# Patient Record
Sex: Female | Born: 1985 | Race: Black or African American | Hispanic: No | Marital: Single | Smoking: Former smoker
Health system: Southern US, Community
[De-identification: ages and names within clinical notes are randomized; demographics above are authoritative.]

## PROBLEM LIST (undated history)

## (undated) ENCOUNTER — Inpatient Hospital Stay (HOSPITAL_COMMUNITY): Payer: Self-pay

## (undated) DIAGNOSIS — J45909 Unspecified asthma, uncomplicated: Secondary | ICD-10-CM

## (undated) DIAGNOSIS — IMO0001 Reserved for inherently not codable concepts without codable children: Secondary | ICD-10-CM

## (undated) DIAGNOSIS — F319 Bipolar disorder, unspecified: Secondary | ICD-10-CM

## (undated) DIAGNOSIS — B009 Herpesviral infection, unspecified: Secondary | ICD-10-CM

## (undated) DIAGNOSIS — O1490 Unspecified pre-eclampsia, unspecified trimester: Secondary | ICD-10-CM

## (undated) DIAGNOSIS — F329 Major depressive disorder, single episode, unspecified: Secondary | ICD-10-CM

## (undated) DIAGNOSIS — D649 Anemia, unspecified: Secondary | ICD-10-CM

## (undated) DIAGNOSIS — A549 Gonococcal infection, unspecified: Secondary | ICD-10-CM

## (undated) DIAGNOSIS — F32A Depression, unspecified: Secondary | ICD-10-CM

## (undated) DIAGNOSIS — R51 Headache: Secondary | ICD-10-CM

## (undated) DIAGNOSIS — B999 Unspecified infectious disease: Secondary | ICD-10-CM

## (undated) DIAGNOSIS — F419 Anxiety disorder, unspecified: Secondary | ICD-10-CM

## (undated) DIAGNOSIS — O139 Gestational [pregnancy-induced] hypertension without significant proteinuria, unspecified trimester: Secondary | ICD-10-CM

## (undated) DIAGNOSIS — A749 Chlamydial infection, unspecified: Secondary | ICD-10-CM

## (undated) DIAGNOSIS — R87629 Unspecified abnormal cytological findings in specimens from vagina: Secondary | ICD-10-CM

## (undated) HISTORY — DX: Reserved for inherently not codable concepts without codable children: IMO0001

## (undated) HISTORY — DX: Herpesviral infection, unspecified: B00.9

## (undated) HISTORY — PX: COLPOSCOPY: SHX161

## (undated) HISTORY — PX: WISDOM TOOTH EXTRACTION: SHX21

---
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
## 2007-02-24 ENCOUNTER — Emergency Department (HOSPITAL_COMMUNITY): Admission: EM | Admit: 2007-02-24 | Discharge: 2007-02-24 | Payer: Self-pay | Admitting: Emergency Medicine

## 2007-05-17 ENCOUNTER — Emergency Department (HOSPITAL_COMMUNITY): Admission: EM | Admit: 2007-05-17 | Discharge: 2007-05-17 | Payer: Self-pay | Admitting: Emergency Medicine

## 2007-08-02 ENCOUNTER — Inpatient Hospital Stay (HOSPITAL_COMMUNITY): Admission: EM | Admit: 2007-08-02 | Discharge: 2007-08-04 | Payer: Self-pay | Admitting: Emergency Medicine

## 2007-08-04 ENCOUNTER — Ambulatory Visit: Payer: Self-pay | Admitting: *Deleted

## 2007-08-04 ENCOUNTER — Inpatient Hospital Stay (HOSPITAL_COMMUNITY): Admission: AD | Admit: 2007-08-04 | Discharge: 2007-08-07 | Payer: Self-pay | Admitting: *Deleted

## 2007-09-13 ENCOUNTER — Emergency Department (HOSPITAL_COMMUNITY): Admission: EM | Admit: 2007-09-13 | Discharge: 2007-09-14 | Payer: Self-pay | Admitting: Family Medicine

## 2007-11-21 ENCOUNTER — Emergency Department (HOSPITAL_COMMUNITY): Admission: EM | Admit: 2007-11-21 | Discharge: 2007-11-21 | Payer: Self-pay | Admitting: Emergency Medicine

## 2008-07-05 ENCOUNTER — Inpatient Hospital Stay (HOSPITAL_COMMUNITY): Admission: AD | Admit: 2008-07-05 | Discharge: 2008-07-05 | Payer: Self-pay | Admitting: Obstetrics & Gynecology

## 2008-07-17 ENCOUNTER — Inpatient Hospital Stay (HOSPITAL_COMMUNITY): Admission: AD | Admit: 2008-07-17 | Discharge: 2008-07-17 | Payer: Self-pay | Admitting: Obstetrics

## 2008-08-02 ENCOUNTER — Inpatient Hospital Stay (HOSPITAL_COMMUNITY): Admission: AD | Admit: 2008-08-02 | Discharge: 2008-08-02 | Payer: Self-pay | Admitting: Obstetrics

## 2008-08-13 ENCOUNTER — Ambulatory Visit (HOSPITAL_COMMUNITY): Admission: RE | Admit: 2008-08-13 | Discharge: 2008-08-13 | Payer: Self-pay | Admitting: Obstetrics & Gynecology

## 2008-09-10 ENCOUNTER — Ambulatory Visit (HOSPITAL_COMMUNITY): Admission: RE | Admit: 2008-09-10 | Discharge: 2008-09-10 | Payer: Self-pay | Admitting: Obstetrics & Gynecology

## 2008-09-16 ENCOUNTER — Ambulatory Visit (HOSPITAL_COMMUNITY): Admission: RE | Admit: 2008-09-16 | Discharge: 2008-09-16 | Payer: Self-pay | Admitting: Obstetrics & Gynecology

## 2008-10-15 ENCOUNTER — Ambulatory Visit (HOSPITAL_COMMUNITY): Admission: RE | Admit: 2008-10-15 | Discharge: 2008-10-15 | Payer: Self-pay | Admitting: Obstetrics & Gynecology

## 2008-11-28 ENCOUNTER — Emergency Department (HOSPITAL_COMMUNITY): Admission: EM | Admit: 2008-11-28 | Discharge: 2008-11-28 | Payer: Self-pay | Admitting: Emergency Medicine

## 2008-12-18 ENCOUNTER — Inpatient Hospital Stay (HOSPITAL_COMMUNITY): Admission: AD | Admit: 2008-12-18 | Discharge: 2008-12-18 | Payer: Self-pay | Admitting: Family Medicine

## 2009-02-10 ENCOUNTER — Ambulatory Visit: Payer: Self-pay | Admitting: Obstetrics and Gynecology

## 2009-02-10 ENCOUNTER — Inpatient Hospital Stay (HOSPITAL_COMMUNITY): Admission: AD | Admit: 2009-02-10 | Discharge: 2009-02-12 | Payer: Self-pay | Admitting: Obstetrics & Gynecology

## 2009-05-04 ENCOUNTER — Emergency Department (HOSPITAL_COMMUNITY): Admission: EM | Admit: 2009-05-04 | Discharge: 2009-05-04 | Payer: Self-pay | Admitting: Emergency Medicine

## 2009-05-29 ENCOUNTER — Emergency Department (HOSPITAL_COMMUNITY): Admission: EM | Admit: 2009-05-29 | Discharge: 2009-05-29 | Payer: Self-pay | Admitting: Emergency Medicine

## 2009-08-21 ENCOUNTER — Inpatient Hospital Stay (HOSPITAL_COMMUNITY): Admission: AD | Admit: 2009-08-21 | Discharge: 2009-08-21 | Payer: Self-pay | Admitting: Obstetrics and Gynecology

## 2009-08-24 ENCOUNTER — Inpatient Hospital Stay (HOSPITAL_COMMUNITY): Admission: AD | Admit: 2009-08-24 | Discharge: 2009-08-24 | Payer: Self-pay | Admitting: Obstetrics and Gynecology

## 2009-08-26 ENCOUNTER — Emergency Department (HOSPITAL_COMMUNITY): Admission: EM | Admit: 2009-08-26 | Discharge: 2009-08-26 | Payer: Self-pay | Admitting: Emergency Medicine

## 2009-08-31 ENCOUNTER — Inpatient Hospital Stay (HOSPITAL_COMMUNITY): Admission: AD | Admit: 2009-08-31 | Discharge: 2009-08-31 | Payer: Self-pay | Admitting: Obstetrics & Gynecology

## 2009-11-07 ENCOUNTER — Emergency Department (HOSPITAL_COMMUNITY): Admission: EM | Admit: 2009-11-07 | Discharge: 2009-11-07 | Payer: Self-pay | Admitting: Emergency Medicine

## 2009-11-16 ENCOUNTER — Inpatient Hospital Stay (HOSPITAL_COMMUNITY): Admission: AD | Admit: 2009-11-16 | Discharge: 2009-11-16 | Payer: Self-pay | Admitting: Obstetrics & Gynecology

## 2009-11-28 ENCOUNTER — Ambulatory Visit (HOSPITAL_COMMUNITY): Admission: RE | Admit: 2009-11-28 | Discharge: 2009-11-28 | Payer: Self-pay | Admitting: Obstetrics & Gynecology

## 2010-02-08 ENCOUNTER — Ambulatory Visit: Payer: Self-pay | Admitting: Obstetrics and Gynecology

## 2010-02-08 ENCOUNTER — Inpatient Hospital Stay (HOSPITAL_COMMUNITY): Admission: AD | Admit: 2010-02-08 | Discharge: 2010-02-08 | Payer: Self-pay | Admitting: Obstetrics

## 2010-02-27 ENCOUNTER — Inpatient Hospital Stay (HOSPITAL_COMMUNITY): Admission: AD | Admit: 2010-02-27 | Discharge: 2010-02-27 | Payer: Self-pay | Admitting: Obstetrics

## 2010-03-16 ENCOUNTER — Inpatient Hospital Stay (HOSPITAL_COMMUNITY): Admission: AD | Admit: 2010-03-16 | Discharge: 2010-03-16 | Payer: Self-pay | Admitting: Obstetrics & Gynecology

## 2010-03-25 ENCOUNTER — Inpatient Hospital Stay (HOSPITAL_COMMUNITY): Admission: AD | Admit: 2010-03-25 | Discharge: 2010-03-25 | Payer: Self-pay | Admitting: Obstetrics

## 2010-03-31 ENCOUNTER — Inpatient Hospital Stay (HOSPITAL_COMMUNITY): Admission: AD | Admit: 2010-03-31 | Discharge: 2010-04-02 | Payer: Self-pay | Admitting: Obstetrics

## 2010-05-13 ENCOUNTER — Emergency Department (HOSPITAL_COMMUNITY): Admission: EM | Admit: 2010-05-13 | Discharge: 2010-05-13 | Payer: Self-pay | Admitting: Family Medicine

## 2011-02-09 LAB — CBC
HCT: 29.3 % — ABNORMAL LOW (ref 36.0–46.0)
HCT: 35.3 % — ABNORMAL LOW (ref 36.0–46.0)
Hemoglobin: 10.3 g/dL — ABNORMAL LOW (ref 12.0–15.0)
Hemoglobin: 12.3 g/dL (ref 12.0–15.0)
MCHC: 34.7 g/dL (ref 30.0–36.0)
MCV: 94.9 fL (ref 78.0–100.0)
RBC: 3.72 MIL/uL — ABNORMAL LOW (ref 3.87–5.11)
RDW: 13.9 % (ref 11.5–15.5)
WBC: 9.1 10*3/uL (ref 4.0–10.5)

## 2011-02-09 LAB — COMPREHENSIVE METABOLIC PANEL
ALT: 12 U/L (ref 0–35)
Alkaline Phosphatase: 78 U/L (ref 39–117)
BUN: 6 mg/dL (ref 6–23)
CO2: 24 mEq/L (ref 19–32)
Calcium: 8.1 mg/dL — ABNORMAL LOW (ref 8.4–10.5)
Chloride: 108 mEq/L (ref 96–112)
Creatinine, Ser: 0.81 mg/dL (ref 0.4–1.2)
GFR calc Af Amer: >60 mL/min (ref >60)
GFR calc non Af Amer: >60 mL/min (ref >60)
Glucose, Bld: 73 mg/dL (ref 70–99)

## 2011-02-09 LAB — GC/CHLAMYDIA PROBE AMP, GENITAL: Chlamydia, DNA Probe: NEGATIVE

## 2011-02-09 LAB — WET PREP, GENITAL: Yeast Wet Prep HPF POC: NONE SEEN

## 2011-02-09 LAB — LACTATE DEHYDROGENASE: LDH: 276 U/L — ABNORMAL HIGH (ref 94–250)

## 2011-02-10 LAB — STREP B DNA PROBE: Strep Group B Ag: NEGATIVE

## 2011-02-10 LAB — WET PREP, GENITAL
Clue Cells Wet Prep HPF POC: NONE SEEN
Trich, Wet Prep: NONE SEEN
Yeast Wet Prep HPF POC: NONE SEEN

## 2011-02-14 LAB — URINALYSIS, ROUTINE W REFLEX MICROSCOPIC
Glucose, UA: NEGATIVE mg/dL
Ketones, ur: NEGATIVE mg/dL
Nitrite: NEGATIVE
pH: 7.5 (ref 5.0–8.0)

## 2011-02-14 LAB — URINE MICROSCOPIC-ADD ON

## 2011-02-22 LAB — URINALYSIS, ROUTINE W REFLEX MICROSCOPIC
Glucose, UA: NEGATIVE mg/dL
Hgb urine dipstick: NEGATIVE
Ketones, ur: 15 mg/dL — AB
Nitrite: NEGATIVE
Protein, ur: 30 mg/dL — AB
Specific Gravity, Urine: 1.029 (ref 1.005–1.030)
Urobilinogen, UA: 1 mg/dL (ref 0.0–1.0)
Urobilinogen, UA: 1 mg/dL (ref 0.0–1.0)

## 2011-02-22 LAB — URINE MICROSCOPIC-ADD ON

## 2011-02-22 LAB — POCT I-STAT, CHEM 8
BUN: 11 mg/dL (ref 6–23)
Chloride: 107 mEq/L (ref 96–112)
Potassium: 3.6 mEq/L (ref 3.5–5.1)
Sodium: 137 mEq/L (ref 135–145)
TCO2: 23 mmol/L (ref 0–100)

## 2011-02-22 LAB — WET PREP, GENITAL

## 2011-02-22 LAB — URINE CULTURE: Colony Count: 25000

## 2011-02-25 LAB — URINE MICROSCOPIC-ADD ON

## 2011-02-25 LAB — URINALYSIS, ROUTINE W REFLEX MICROSCOPIC
Glucose, UA: NEGATIVE mg/dL
Glucose, UA: NEGATIVE mg/dL
Ketones, ur: NEGATIVE mg/dL
Nitrite: NEGATIVE
Protein, ur: 30 mg/dL — AB
Protein, ur: NEGATIVE mg/dL
Specific Gravity, Urine: 1.015 (ref 1.005–1.030)
Specific Gravity, Urine: 1.035 — ABNORMAL HIGH (ref 1.005–1.030)
Urobilinogen, UA: 1 mg/dL (ref 0.0–1.0)
pH: 6 (ref 5.0–8.0)

## 2011-02-25 LAB — WET PREP, GENITAL: Clue Cells Wet Prep HPF POC: NONE SEEN

## 2011-02-25 LAB — CBC
HCT: 35.3 % — ABNORMAL LOW (ref 36.0–46.0)
MCHC: 34 g/dL (ref 30.0–36.0)
MCV: 93.4 fL (ref 78.0–100.0)
Platelets: 159 10*3/uL (ref 150–400)
RDW: 14.6 % (ref 11.5–15.5)

## 2011-02-25 LAB — COMPREHENSIVE METABOLIC PANEL
ALT: 8 U/L (ref 0–35)
AST: 15 U/L (ref 0–37)
Albumin: 3.5 g/dL (ref 3.5–5.2)
CO2: 25 mEq/L (ref 19–32)
Calcium: 8.9 mg/dL (ref 8.4–10.5)
GFR calc Af Amer: >60 mL/min (ref >60)
Sodium: 134 mEq/L — ABNORMAL LOW (ref 135–145)
Total Protein: 6.1 g/dL (ref 6.0–8.3)

## 2011-02-25 LAB — URINE CULTURE: Colony Count: 30000

## 2011-03-01 LAB — URINALYSIS, ROUTINE W REFLEX MICROSCOPIC
Bilirubin Urine: NEGATIVE
Nitrite: NEGATIVE
Specific Gravity, Urine: 1.015 (ref 1.005–1.030)
pH: 7 (ref 5.0–8.0)

## 2011-03-01 LAB — URINE CULTURE

## 2011-03-01 LAB — URINE MICROSCOPIC-ADD ON

## 2011-03-04 LAB — CBC
HCT: 34.3 % — ABNORMAL LOW (ref 36.0–46.0)
Hemoglobin: 11.4 g/dL — ABNORMAL LOW (ref 12.0–15.0)
MCV: 92.5 fL (ref 78.0–100.0)
Platelets: 156 10*3/uL (ref 150–400)
WBC: 9.2 10*3/uL (ref 4.0–10.5)

## 2011-03-08 LAB — CBC
HCT: 30.7 % — ABNORMAL LOW (ref 36.0–46.0)
Hemoglobin: 10.4 g/dL — ABNORMAL LOW (ref 12.0–15.0)
MCHC: 33.8 g/dL (ref 30.0–36.0)
MCV: 93.2 fL (ref 78.0–100.0)
RBC: 3.29 MIL/uL — ABNORMAL LOW (ref 3.87–5.11)
RDW: 13.2 % (ref 11.5–15.5)

## 2011-03-08 LAB — BASIC METABOLIC PANEL
CO2: 21 mEq/L (ref 19–32)
Chloride: 107 mEq/L (ref 96–112)
Glucose, Bld: 76 mg/dL (ref 70–99)
Potassium: 3.2 mEq/L — ABNORMAL LOW (ref 3.5–5.1)
Sodium: 132 mEq/L — ABNORMAL LOW (ref 135–145)

## 2011-03-08 LAB — URINALYSIS, ROUTINE W REFLEX MICROSCOPIC
Bilirubin Urine: NEGATIVE
Glucose, UA: NEGATIVE mg/dL
Hgb urine dipstick: NEGATIVE
Specific Gravity, Urine: 1.015 (ref 1.005–1.030)
Urobilinogen, UA: 0.2 mg/dL (ref 0.0–1.0)

## 2011-03-08 LAB — DIFFERENTIAL
Basophils Absolute: 0 10*3/uL (ref 0.0–0.1)
Basophils Relative: 0 % (ref 0–1)
Eosinophils Relative: 0 % (ref 0–5)
Monocytes Absolute: 0.7 10*3/uL (ref 0.1–1.0)
Monocytes Relative: 12 % (ref 3–12)

## 2011-04-06 NOTE — Consult Note (Signed)
NAMEONEY, TATLOCK NO.:  [id number]   MEDICAL RECORD NO.:  [id number]          PATIENT TYPE:  OBV   LOCATION:  4706                         FACILITY:  [facility]   PHYSICIAN:  Antonietta Breach, M.D.  DATE OF BIRTH:  06/10/1986   DATE OF CONSULTATION:  08/02/2007  DATE OF DISCHARGE:                                 CONSULTATION   REASON FOR CONSULTATION:  Overdose.   REQUESTING PHYSICIAN:  [facility].   HISTORY OF PRESENT ILLNESS:  Mrs. Terrace Chiem is a 25 year old female  admitted to the [facility] on August 01, 2007, due to overdose.   The patient states that she had a number of stresses.  She has not been  able to see her 56-year-old daughter as much as she would like to.  Her 30-  year-old daughter was under the custody of her sister.  The patient also  has had financial stress.  She has had some disagreements with her  boyfriend.  She lives with her boyfriend in an apartment.   The patient states that she has had over 8 weeks of depressed mood,  irritability, low concentration, anhedonia, difficulty sleeping.  She  has thought of other methods of harming herself, including cutting.  After a fight with her boyfriend, she took an overdose of iron   PAST PSYCHIATRIC HISTORY:  On review of the past medical record,  schizophrenia is listed; however, the patient does not describe having  auditory hallucinations or hallucinations of any kind in her past.  She  did have a pretend childhood friend, up until the age of 46.   The patient describes periods lasting several days, where she has needed  very little sleep, has had high energy, as well as engaging in  __________ substance use with marijuana alcohol.  She also has had a  major depressive episodes.  She denies any history of self harm.   She was on psychotropic medication until 1 year ago when she stopped,  and she stated that they were making her too sedated.   The patient has been admitted to  [facility] before.   The patient's past psychotropics include Seroquel and Trileptal.   FAMILY PSYCHIATRIC HISTORY:  None known.   SOCIAL HISTORY:  The patient has one child, an 29-year-old daughter.  This 14-year-old daughter was raised by the patient's sister.  The 8-year-  old daughter visits the patient off and on.  The patient continues to  smoke marijuana on a regular basis.  She states that it helps her feel  better.  Occupation:  Unemployed.  She has currently broken up with her  boyfriend.  She was living with him in an apartment.   PAST MEDICAL HISTORY:  Asthma, status post iron overdose.   MEDICATIONS:  MAR is reviewed.  The patient is on Ativan 1 mg q.6 hours  p.r.n.   ALLERGIES:  SHE HAS NO KNOWN DRUG ALLERGIES.   LABORATORY DATA:  Urine drug screen positive for benzodiazepines,  positive for tetrahydrocannabinol, pregnancy test negative, SGOT 21,  SGPT 8, albumin 3.6, Tylenol negative, BUN 15, creatinine  0.85, WBC 6.1,  hemoglobin 10.8, platelet count 194.   REVIEW OF SYSTEMS:  CONSTITUTIONAL:  Afebrile.  HEAD:  No trauma.  EYES:  No visual changes.  EARS:  No hearing impairment.  NOSE:  No rhinorrhea.  MOUTH/THROAT:  No sore throat.  NEUROLOGIC:  No focal motor or sensory  deficit.  PSYCHIATRIC:  As above.  CARDIOVASCULAR:  No chest pain,  palpitations.  RESPIRATORY:  No coughing or wheezing.  GASTROINTESTINAL:  The patient still has some abdominal discomfort.  She is not vomiting.  GENITOURINARY:  No dysuria.  SKIN:  Unremarkable.  ENDOCRINE/METABOLIC:  No heat or cold intolerance.  MUSCULOSKELETAL:  No deformities.  HEMATOLOGIC/LYMPHATIC:  Anemia.   EXAMINATION:  VITAL SIGNS:  Temperature 97.9, pulse 59, respiratory rate  20, blood pressure 99/60, weight is 66 kg, height 64 inches.  GENERAL APPEARANCE:  Ms. Kruczek is a young female appearing her  chronologic age, lying in a supine position in her hospital bed.  She  has no abnormal involuntary  movements.  She is well-groomed.   OTHER MENTAL STATUS EXAM:  Ms. Panning is alert.  Her attention span is  within normal limits.  She has good eye contact.   She is oriented to all spheres.  Memory is intact to immediate, recent,  and remote.  Fund of knowledge and intelligence are within normal  limits.  Her affect is very constricted, and she begins to cry with  tears spontaneously.  Her mood is depressed.  Her speech involves normal  rate and prosody without dysarthria.  Thought process logical, coherent,  goal-directed, no looseness of associations. Language, expression and  comprehension are intact.  Abstraction ability is intact.  Thought  content:  Please see the history of present illness.  The patient does  acknowledge an intentional overdose.  She also has thoughts of  hopelessness and helplessness.  She has no hallucinations or delusions.   Her insight is partial.   Her judgment is impaired, for the ability for self-care outside the  hospital.   ASSESSMENT:  Axis I:  1. 293.83, mood disorder not otherwise specified, depressed      (functional and general medical factors).  2. Rule out 296.80, bipolar disorder not otherwise specified,      depressed versus 296.33.  Major depressive disorder, recurrent,      severe.  3. Polysubstance dependence with recent tetrahydrocannabinol, positive      on drug screen and an acknowledgment of regular marijuana use.  Axis II:  Deferred.  Axis III:  See past medical history above.  Axis IV:  Primary support group.  Axis V:  30.   Ms. Herzberg is still at risk to harm herself.   The undersigned provided ego support and education.   RECOMMENDATIONS:  1. Would continue the sitter for suicide precautions.  2. When the patient is medically cleared, would admit her to an      inpatient psychiatric unit for a dual diagnosis track.   RECOMMENDATIONS:  Will defer psychotropic medications at this time.      Antonietta Breach, M.D.   Electronically Signed     JW/MEDQ  D:  08/02/2007  T:  08/03/2007  Job:  [id number]

## 2011-04-06 NOTE — Discharge Summary (Signed)
Tammy Cole, Tammy Cole               ACCOUNT NO.:  [id number]   MEDICAL RECORD NO.:  [id number]          PATIENT TYPE:  OBV   LOCATION:  4706                         FACILITY:  [facility]   PHYSICIAN:  Lonia Blood, M.D.      DATE OF BIRTH:  1986-07-23   DATE OF ADMISSION:  08/01/2007  DATE OF DISCHARGE:  08/03/2007                               DISCHARGE SUMMARY   PRIMARY CARE PHYSICIAN:  The patient was unassigned.   DISCHARGE DIAGNOSES:  1. Drug overdose with iron.  2. Suicide attempt.  3. Polysubstance abuse.   DISCHARGE MEDICATIONS:  1. Albuterol inhaler as needed.  2. Ativan 1 mg q.8 hours p.r.n. for agitation.   DISPOSITION:  The patient will be transferred to inpatient psych for  Psychiatry recommendation.   PROCEDURE PERFORMED THIS ADMISSION:  Abdominal x-ray on 08/01/2007 but  no essentially no specific bowel gas pattern.   CONSULTATIONS:  Antonietta Breach, MD: Psychiatry.   BRIEF HISTORY AND PHYSICAL:  Please refer to dictated history and  physical by me on 08/01/2007.  In short, this is a 25 year old female  who was admitted secondary to taking 20 pills of iron in form of  overdose.  The patient was apparently depressed, worried and was  aggravated by a quarrel with her boyfriend.  She could not hold it  together; hence she took 20 pills of iron.  Later she denied further  suicidal ideation.  She was, however, admitted for suicide attempt and  drug overdose.   HOSPITAL COURSE:  1. Iron overdose.  The patient's total iron level after the incident      was more than 400, at about 443.  It was followed serially, and her      last iron level was 21 yesterday.  This shows that the iron level      has cleared her system or at least her blood stream at this point.      She is subsequently at least out of immediate danger for now.  2. Severe depression.  The patient was seen by Psychiatry.  Our      recommendation is for Inpatient Psych, so will proceed with that.  3. Suicide  attempt again.  The patient has had a sitter and on suicide      precaution all through hospitalization.  In light of this, she will      be treated in an Inpatient Psych facility per Dr. Providence Crosby      recommendation.  Other than she has been doing alright and no major      issues.      Lonia Blood, M.D.  Electronically Signed     LG/MEDQ  D:  08/03/2007  T:  08/03/2007  Job:  [id number]

## 2011-04-06 NOTE — H&P (Signed)
NAMESENIE, Tammy Cole               ACCOUNT NO.:  [id number]   MEDICAL RECORD NO.:  [id number]          PATIENT TYPE:  OBV   LOCATION:  4706                         FACILITY:  [facility]   PHYSICIAN:  Lonia Blood, M.D.      DATE OF BIRTH:  Jan 26, 1986   DATE OF ADMISSION:  08/01/2007  DATE OF DISCHARGE:                              HISTORY & PHYSICAL   PRIMARY CARE Zeta Bucy:  The patient is unassigned.   PRESENTING COMPLAINT:  Iron overdose.   HISTORY OF PRESENT ILLNESS:  The patient is a 25 year old female with  history of depression, bipolar disorder, question of schizophrenia,who  has been off her medications for more than a year.  The patient has been  undergoing a lot of stress lately, breaking up with her boyfriend among  other things.  She was emotionally upset today when she had an episode  of quarreling with her boyfriend.  She took #20 tablets of iron 325 mg.  She subsequently developed severe abdominal pain and was brought to the  emergency room.  She is currently stable, and no longer suicidal.  The  patient said she regrets her actions, and she was only angry.  She  apparently started going back to some therapy lately.   PAST MEDICAL HISTORY:  Significant for bipolar disorder, schizophrenia,  and asthma.   ALLERGIES:  No known drug allergies.   MEDICATIONS:  The patient quit taking her medications except the iron  pills.  She is supposed to be on Advair Diskus, iron, Seroquel,  Singulair, and Trileptal.   SOCIAL HISTORY:  The patient is single.  She has an 42-year-old child.  She denies smoking or alcohol use, or IV drug use.   FAMILY HISTORY:  Denied family history of psychiatric illness.   REVIEW OF SYSTEMS:  Mainly abdominal pain and nausea, which has since  resolved, per patient.   PHYSICAL EXAMINATION:  VITAL SIGNS:  Temperature 97.0, blood pressure  98/56, pulse 60, respiratory rate 18.  Saturation is 100% on room air.  GENERAL:  Generally, she is awake, alert,  oriented, and in no acute  distress.  HEENT:  PERRL, EOMI.  NECK:  Supple, no JVD.  No lymphadenopathy.  RESPIRATORY:  Good air entry bilaterally.  No wheezes, no rales.  CARDIOVASCULAR:  She has S1 and S2.  No murmurs.  ABDOMEN:  Soft, nontender, with positive bowel sounds.  EXTREMITIES:  No edema, cyanosis or clubbing.  PSYCHIATRIC:  The patient has flat affect.  She denies being suicidal at  this point.   LABORATORY DATA:  Showed a white count 6.1, hemoglobin 10.8, and  platelet count 194.  Pregnancy test was negative.  Other labs are  currently pending.  EKG showed sinus rhythm with rate of 57, and some  junctional AV dissociation and junctional rhythm.  No old EKG to  compare.   ASSESSMENT:  This is a 25 year old female with drug overdose mainly  involving iron.  The patient denied active suicidal ideation, but she  did have suicidal attempt when she took all the #20 pills of iron.   PLAN:  1. Drug  overdose:  Will admit the patient to monitored bed.  Will put      her on suicide precautions.  Will get psychiatric consultation to      see the patient.  Once she is cleared from psychiatry, we can      discharge the patient.  In the meantime, however, poison control      has been called.  Recommendations are for observant therapy, check      iron levels in 4 hours, and hydration.  I will also put her on some      proton-pump inhibitors to hopefully help for protection from      gastritis, and await psychiatric consultation.  2. Depression, bipolar disorder:  Again, the patient has been off her      medications including Seroquel and Trileptal.  I will get      psychiatric consultation, and probably get the patient back on her      medicines as soon as possible.  3. History of asthma.  I will put her on p.r.n. nebulizers while in      the hospital.  Otherwise, other treatment will depend on how      patient behaves overnight.  We will keep her on observation.      Lonia Blood, M.D.  Electronically Signed     LG/MEDQ  D:  08/01/2007  T:  08/01/2007  Job:  [id number]

## 2011-04-09 NOTE — Discharge Summary (Signed)
Tammy Cole, NICKLE NO.:  [id number]   MEDICAL RECORD NO.:  [id number]          PATIENT TYPE:  IPS   LOCATION:  0303                          [facility]ACILITY:  [facility]   PHYSICIAN:  Jasmine Pang, M.D. DATE OF BIRTH:  Apr 18, 1986   DATE OF ADMISSION:  08/04/2007  DATE OF DISCHARGE:  08/07/2007                               DISCHARGE SUMMARY   IDENTIFICATION:  A 25 year old single African American female who was  admitted on August 04, 2007.   HISTORY OF PRESENT ILLNESS:  The patient was sent for [facility] with the diagnosis of depression.  Apparently, she had gotten  into a fight with her boyfriend on August 02, 2007.  She then  overdosed on iron pills, took 20 tablets.  Other stressors as stated are  housing, job and wants her 70-year-old daughter back who is now under the  custody of her mother.  She reports a recent history of irritability,  low concentration, anhedonia, and difficulty sleeping.  Her iron level  was at 400 when she went into the [facility] on August 01, 2007.  It is now down to 21.  Poison Control was called.  She denies any  alcohol or other substance use.  She has had a history of being treated  at [facility] inpatient.  She has no current outpatient  treatment.  She is on Advair Diskus, iron pills, and Singulair.  She has  been on Seroquel and Trileptal, but stopped these psych meds 1 year ago.  She has a history of asthma.  She has no known drug allergies.   PHYSICAL EXAMINATION:  There were no acute physical abnormalities noted.  Complete physical exam is noted in the discharge summary from her  inpatient stay on the medical unit.   ADMISSION LABORATORIES:  Not repeated.  They were done on the medical  unit and reviewed by her internist.   HOSPITAL COURSE:  Upon admission, the patient was placed on Ambien 10 mg  p.o. p.r.n. at h.s. for inability to sleep.  She was also placed on  Celexa 20 mg daily and  Vistaril 25 mg q. 6h. p.r.n. anxiety.  She was  started on Anaprox 500 mg p.o. b.i.d. p.r.n. pain due to phlebitis in  her left arm secondary to potassium infusion.  She was also given warm  compresses to her left arm q.i.d. x3 days.  The patient tolerated her  medications well with no significant side effects.  The patient was  friendly and cooperative.  She was somewhat sleepy, but oriented x4.  She was nonpsychotic.  She did appear to be depressed.  She had had some  agitation according to her nurse, but this was resolving.  This had been  after talking with her boyfriend on the phone the day before.  She  states she has a history of bipolar disorder.  She has previously been  on Seroquel 600 mg daily and Trileptal ? dose.  She has been quite  stable.  She reports recent irritability and losing her temper with her  boyfriend.  She has had no suicidal thoughts in 24 hours. On August 07, 2007, mental status had improved from admission status.  Mood was  euthymic.  Affect wide range.  There was no suicidal or homicidal  ideation.  No thoughts of self-injurious behavior.  No paranoia or  delusions.  Thoughts were logical and goal directed.  Thought content no  predominant theme.  Cognitive was grossly back to baseline.  It was felt  the patient was safe to be discharged today.  She was able to voice  understanding of her discharge instructions and followup plan.   DISCHARGE DIAGNOSES:  AXIS I:  Depressive disorder, not otherwise  specified.  AXIS II:  None.  AXIS III:  None.  AXIS IV:  Moderate (relationship stress, burden of psychiatric illness).  AXIS V:  Global assessment of functioning upon discharge was 50.  Global  assessment of functioning upon admission was 38.  Global assessment of  functioning highest past year was 69.   DISCHARGE PLANS:  There were no specific activity level or dietary  restrictions.   POSTHOSPITAL CARE PLANS:  The patient will be seen at the [facility]  on August 08, 2007, at 9:00 a.m..   DISCHARGE MEDICATIONS:  Celexa 20 mg daily and Ambien 10 mg at bedtime  p.r.n. insomnia.      Jasmine Pang, M.D.  Electronically Signed     BHS/MEDQ  D:  08/26/2007  T:  08/26/2007  Job:  [id number]

## 2011-08-20 LAB — URINE MICROSCOPIC-ADD ON

## 2011-08-20 LAB — WET PREP, GENITAL: Yeast Wet Prep HPF POC: NONE SEEN

## 2011-08-20 LAB — GC/CHLAMYDIA PROBE AMP, GENITAL
Chlamydia, DNA Probe: NEGATIVE
GC Probe Amp, Genital: NEGATIVE

## 2011-08-20 LAB — URINALYSIS, ROUTINE W REFLEX MICROSCOPIC
Bilirubin Urine: NEGATIVE
Glucose, UA: NEGATIVE
Hgb urine dipstick: NEGATIVE
Protein, ur: NEGATIVE
Urobilinogen, UA: 0.2

## 2011-08-25 LAB — URINALYSIS, ROUTINE W REFLEX MICROSCOPIC
Hgb urine dipstick: NEGATIVE
Ketones, ur: >80 — AB
Nitrite: NEGATIVE
Specific Gravity, Urine: >1.03 — ABNORMAL HIGH
pH: 6

## 2011-09-03 LAB — COMPREHENSIVE METABOLIC PANEL
AST: 21
BUN: 15
CO2: 23
Calcium: 8.4
Chloride: 110
Creatinine, Ser: 0.85
GFR calc non Af Amer: >60
Glucose, Bld: 167 — ABNORMAL HIGH
Total Bilirubin: 1

## 2011-09-03 LAB — DIFFERENTIAL
Basophils Absolute: 0
Eosinophils Relative: 0
Lymphocytes Relative: 19
Lymphs Abs: 1.1
Neutro Abs: 4.6
Neutrophils Relative %: 75

## 2011-09-03 LAB — URINE MICROSCOPIC-ADD ON

## 2011-09-03 LAB — CBC
HCT: 32.5 — ABNORMAL LOW
MCHC: 33.2
MCV: 88
RBC: 3.7 — ABNORMAL LOW
WBC: 6.1

## 2011-09-03 LAB — RAPID URINE DRUG SCREEN, HOSP PERFORMED
Barbiturates: NOT DETECTED
Benzodiazepines: POSITIVE — AB
Cocaine: NOT DETECTED
Opiates: NOT DETECTED

## 2011-09-03 LAB — IRON
Iron: 21 — ABNORMAL LOW
Iron: 443 — ABNORMAL HIGH
Iron: 446 — ABNORMAL HIGH

## 2011-09-03 LAB — URINALYSIS, ROUTINE W REFLEX MICROSCOPIC
Glucose, UA: NEGATIVE
Hgb urine dipstick: NEGATIVE
Ketones, ur: 15 — AB
Protein, ur: NEGATIVE
Urobilinogen, UA: 0.2

## 2011-09-03 LAB — POCT PREGNANCY, URINE: Preg Test, Ur: NEGATIVE

## 2011-09-08 LAB — URINE MICROSCOPIC-ADD ON

## 2011-09-08 LAB — URINALYSIS, ROUTINE W REFLEX MICROSCOPIC
Glucose, UA: NEGATIVE
Hgb urine dipstick: NEGATIVE
Protein, ur: NEGATIVE
Specific Gravity, Urine: >1.03 — ABNORMAL HIGH
pH: 6

## 2011-09-08 LAB — CBC
Hemoglobin: 11.2 — ABNORMAL LOW
MCHC: 33
Platelets: 203
RDW: 16.2 — ABNORMAL HIGH

## 2011-09-08 LAB — RPR: RPR Ser Ql: NONREACTIVE

## 2011-09-08 LAB — GC/CHLAMYDIA PROBE AMP, GENITAL: Chlamydia, DNA Probe: NEGATIVE

## 2012-11-22 NOTE — L&D Delivery Note (Signed)
Delivery Note At 9:31 AM a viable female was delivered via  (Presentation: ROA).  APGAR: 9.   Placenta status: delivered with assistance, intact.  Cord:  with the following complications: none, 3 vessels.   Anesthesia: Epidural, local Episiotomy:  None Lacerations: Labial Suture Repair: 2.0 vicryl rapide Est. Blood Loss (mL): 250 ml  Mom to postpartum.  Baby to nursery-stable.  JACKSON-MOORE,Pragya Lofaso A 08/27/2013, 9:53 AM

## 2013-01-09 ENCOUNTER — Inpatient Hospital Stay (HOSPITAL_COMMUNITY): Payer: Medicaid Other

## 2013-01-09 ENCOUNTER — Inpatient Hospital Stay (HOSPITAL_COMMUNITY)
Admission: AD | Admit: 2013-01-09 | Discharge: 2013-01-09 | Disposition: A | Discharge status: Home / Self Care | Payer: Medicaid Other | Source: Ambulatory Visit | Attending: Obstetrics and Gynecology | Admitting: Obstetrics and Gynecology

## 2013-01-09 ENCOUNTER — Encounter (HOSPITAL_COMMUNITY): Payer: Self-pay

## 2013-01-09 DIAGNOSIS — Z1389 Encounter for screening for other disorder: Secondary | ICD-10-CM

## 2013-01-09 DIAGNOSIS — R109 Unspecified abdominal pain: Secondary | ICD-10-CM | POA: Insufficient documentation

## 2013-01-09 DIAGNOSIS — B9689 Other specified bacterial agents as the cause of diseases classified elsewhere: Secondary | ICD-10-CM | POA: Insufficient documentation

## 2013-01-09 DIAGNOSIS — Z3201 Encounter for pregnancy test, result positive: Secondary | ICD-10-CM

## 2013-01-09 DIAGNOSIS — N760 Acute vaginitis: Secondary | ICD-10-CM | POA: Insufficient documentation

## 2013-01-09 DIAGNOSIS — O09219 Supervision of pregnancy with history of pre-term labor, unspecified trimester: Secondary | ICD-10-CM

## 2013-01-09 DIAGNOSIS — Z3490 Encounter for supervision of normal pregnancy, unspecified, unspecified trimester: Secondary | ICD-10-CM

## 2013-01-09 DIAGNOSIS — D696 Thrombocytopenia, unspecified: Secondary | ICD-10-CM | POA: Diagnosis present

## 2013-01-09 DIAGNOSIS — F3170 Bipolar disorder, currently in remission, most recent episode unspecified: Secondary | ICD-10-CM | POA: Diagnosis not present

## 2013-01-09 DIAGNOSIS — A499 Bacterial infection, unspecified: Secondary | ICD-10-CM | POA: Insufficient documentation

## 2013-01-09 DIAGNOSIS — O2390 Unspecified genitourinary tract infection in pregnancy, unspecified trimester: Secondary | ICD-10-CM | POA: Insufficient documentation

## 2013-01-09 HISTORY — DX: Anemia, unspecified: D64.9

## 2013-01-09 HISTORY — DX: Headache: R51

## 2013-01-09 HISTORY — DX: Unspecified asthma, uncomplicated: J45.909

## 2013-01-09 LAB — WET PREP, GENITAL: Trich, Wet Prep: NONE SEEN

## 2013-01-09 LAB — CBC
MCH: 30.9 pg (ref 26.0–34.0)
MCV: 91.9 fL (ref 78.0–100.0)
Platelets: 142 10*3/uL — ABNORMAL LOW (ref 150–400)
RDW: 13.2 % (ref 11.5–15.5)

## 2013-01-09 LAB — URINALYSIS, ROUTINE W REFLEX MICROSCOPIC
Bilirubin Urine: NEGATIVE
Hgb urine dipstick: NEGATIVE
Ketones, ur: NEGATIVE mg/dL
Nitrite: NEGATIVE
Specific Gravity, Urine: 1.02 (ref 1.005–1.030)
Urobilinogen, UA: 0.2 mg/dL (ref 0.0–1.0)
pH: 7 (ref 5.0–8.0)

## 2013-01-09 MED ORDER — METRONIDAZOLE 500 MG PO TABS: 500.0000 mg | ORAL_TABLET | Freq: Two times a day (BID) | ORAL | Status: DC

## 2013-01-09 NOTE — MAU Provider Note (Signed)
History     CSN: [id number]  Arrival date and time: 01/09/13 0865   First Provider Initiated Contact with Patient 01/09/13 [phone]      Chief Complaint  Patient presents with  . Abdominal Pain  . Possible Pregnancy   HPI THis is a 27 y.o. female at [redacted]w[redacted]d who presents with c/o pregnancy with lower abdominal cramping. States LMP was 11/28/12 but was different than usual. Then noticed she could not sleep on her stomach as usual and suspected she was pregnant. Plans care with [facility].  Denies bleeding but reports white discharge.   RN Note: Patient states she has had a positive home pregnancy test. Has been having abdominal pain that goes from side to side off and on. Has some nausea, no vomiting. Denies bleeding but having a thin milky vaginal discharge.        OB History   Grav Para Term Preterm Abortions TAB SAB Ect Mult Living   5 3 2 1 1  1   3       Past Medical History  Diagnosis Date  . Asthma   . Anemia   . Abnormal Pap smear   . Headache     Past Surgical History  Procedure Laterality Date  . Colposcopy    . Vaginal delivery      X3  . Wisdom tooth extraction      History reviewed. No pertinent family history.  History  Substance Use Topics  . Smoking status: Former Smoker    Types: Cigarettes    Quit date: 12/20/2012  . Smokeless tobacco: Never Used  . Alcohol Use: No    Allergies: No Known Allergies  No prescriptions prior to admission    Review of Systems  Constitutional: Negative for fever, chills and malaise/fatigue.  Gastrointestinal: Positive for nausea and abdominal pain. Negative for vomiting, diarrhea and constipation.  Genitourinary: Negative for dysuria.  Psychiatric/Behavioral: Negative for depression.   Physical Exam   Blood pressure 109/67, pulse 67, temperature 97.6 F (36.4 C), temperature source Oral, resp. rate 16, height 5\' 5"  (1.651 m), weight 143 lb 9.6 oz (65.137 kg), last menstrual period 11/28/2012.  Physical Exam   Constitutional: She is oriented to person, place, and time. She appears well-developed and well-nourished. No distress.  HENT:  Head: Normocephalic.  Cardiovascular: Normal rate.   Respiratory: Effort normal.  GI: Soft. She exhibits no distension and no mass. There is tenderness (slight tenderness over uterus). There is no rebound and no guarding.  Genitourinary: Uterus normal. Vaginal discharge (thin white) found.  Musculoskeletal: Normal range of motion.  Neurological: She is alert and oriented to person, place, and time.  Skin: Skin is warm and dry.  Psychiatric: She has a normal mood and affect.    MAU Course  Procedures  MDM Results for orders placed during the hospital encounter of 01/09/13 (from the past 24 hour(s))  URINALYSIS, ROUTINE W REFLEX MICROSCOPIC     Status: None   Collection Time    01/09/13  7:48 AM      Result Value Range   Color, Urine YELLOW  YELLOW   APPearance CLEAR  CLEAR   Specific Gravity, Urine 1.020  1.005 - 1.030   pH 7.0  5.0 - 8.0   Glucose, UA NEGATIVE  NEGATIVE mg/dL   Hgb urine dipstick NEGATIVE  NEGATIVE   Bilirubin Urine NEGATIVE  NEGATIVE   Ketones, ur NEGATIVE  NEGATIVE mg/dL   Protein, ur NEGATIVE  NEGATIVE mg/dL   Urobilinogen, UA 0.2  0.0 - 1.0 mg/dL   Nitrite NEGATIVE  NEGATIVE   Leukocytes, UA NEGATIVE  NEGATIVE  POCT PREGNANCY, URINE     Status: Abnormal   Collection Time    01/09/13  7:54 AM      Result Value Range   Preg Test, Ur POSITIVE (*) NEGATIVE  CBC     Status: Abnormal   Collection Time    01/09/13  8:35 AM      Result Value Range   WBC 4.5  4.0 - 10.5 K/uL   RBC 3.69 (*) 3.87 - 5.11 MIL/uL   Hemoglobin 11.4 (*) 12.0 - 15.0 g/dL   HCT 10.9 (*) 32.3 - 55.7 %   MCV 91.9  78.0 - 100.0 fL   MCH 30.9  26.0 - 34.0 pg   MCHC 33.6  30.0 - 36.0 g/dL   RDW 32.2  02.5 - 42.7 %   Platelets 142 (*) 150 - 400 K/uL  WET PREP, GENITAL     Status: Abnormal   Collection Time    01/09/13  8:45 AM      Result Value Range    Yeast Wet Prep HPF POC NONE SEEN  NONE SEEN   Trich, Wet Prep NONE SEEN  NONE SEEN   Clue Cells Wet Prep HPF POC FEW (*) NONE SEEN   WBC, Wet Prep HPF POC FEW (*) NONE SEEN   US shows SIUP at 6.1 weeks with [facility] 09/04/13  Assessment and Plan  A:  SIUP at [redacted]w[redacted]d confirmed by ultrasound       BV, mild  P;  Discharge home       Rx Flagyl        Followup at [facility] for prenatal care   Medication List    TAKE these medications       metroNIDAZOLE 500 MG tablet  Commonly known as:  FLAGYL  Take 1 tablet (500 mg total) by mouth 2 (two) times daily.         [facility] 01/09/2013, 9:00 AM

## 2013-01-09 NOTE — MAU Note (Signed)
Patient states she has had a positive home pregnancy test. Has been having abdominal pain that goes from side to side off and on. Has some nausea, no vomiting. Denies bleeding but having a thin milky vaginal discharge.

## 2013-01-09 NOTE — Discharge Instructions (Signed)
Bacterial Vaginosis Bacterial vaginosis (BV) is a vaginal infection where the normal balance of bacteria in the vagina is disrupted. The normal balance is then replaced by an overgrowth of certain bacteria. There are several different kinds of bacteria that can cause BV. BV is the most common vaginal infection in women of childbearing age. CAUSES   The cause of BV is not fully understood. BV develops when there is an increase or imbalance of harmful bacteria.  Some activities or behaviors can upset the normal balance of bacteria in the vagina and put women at increased risk including:  Having a new sex partner or multiple sex partners.  Douching.  Using an intrauterine device (IUD) for contraception.  It is not clear what role sexual activity plays in the development of BV. However, women that have never had sexual intercourse are rarely infected with BV. Women do not get BV from toilet seats, bedding, swimming pools or from touching objects around them.  SYMPTOMS   Grey vaginal discharge.  A fish-like odor with discharge, especially after sexual intercourse.  Itching or burning of the vagina and vulva.  Burning or pain with urination.  Some women have no signs or symptoms at all. DIAGNOSIS  Your caregiver must examine the vagina for signs of BV. Your caregiver will perform lab tests and look at the sample of vaginal fluid through a microscope. They will look for bacteria and abnormal cells (clue cells), a pH test higher than 4.5, and a positive amine test all associated with BV.  RISKS AND COMPLICATIONS   Pelvic inflammatory disease (PID).  Infections following gynecology surgery.  Developing HIV.  Developing herpes virus. TREATMENT  Sometimes BV will clear up without treatment. However, all women with symptoms of BV should be treated to avoid complications, especially if gynecology surgery is planned. Female partners generally do not need to be treated. However, BV may spread  between female sex partners so treatment is helpful in preventing a recurrence of BV.   BV may be treated with antibiotics. The antibiotics come in either pill or vaginal cream forms. Either can be used with nonpregnant or pregnant women, but the recommended dosages differ. These antibiotics are not harmful to the baby.  BV can recur after treatment. If this happens, a second round of antibiotics will often be prescribed.  Treatment is important for pregnant women. If not treated, BV can cause a premature delivery, especially for a pregnant woman who had a premature birth in the past. All pregnant women who have symptoms of BV should be checked and treated.  For chronic reoccurrence of BV, treatment with a type of prescribed gel vaginally twice a week is helpful. HOME CARE INSTRUCTIONS   Finish all medication as directed by your caregiver.  Do not have sex until treatment is completed.  Tell your sexual partner that you have a vaginal infection. They should see their caregiver and be treated if they have problems, such as a mild rash or itching.  Practice safe sex. Use condoms. Only have 1 sex partner. PREVENTION  Basic prevention steps can help reduce the risk of upsetting the natural balance of bacteria in the vagina and developing BV:  Do not have sexual intercourse (be abstinent).  Do not douche.  Use all of the medicine prescribed for treatment of BV, even if the signs and symptoms go away.  Tell your sex partner if you have BV. That way, they can be treated, if needed, to prevent reoccurrence. SEEK MEDICAL CARE IF:     Your symptoms are not improving after 3 days of treatment.  You have increased discharge, pain, or fever. MAKE SURE YOU:   Understand these instructions.  Will watch your condition.  Will get help right away if you are not doing well or get worse. FOR MORE INFORMATION  Division of STD Prevention (DSTDP), Centers for Disease Control and Prevention:  www.cdc.gov/std American Social Health Association (ASHA): www.ashastd.org  Document Released: 11/08/2005 Document Revised: 01/31/2012 Document Reviewed: 05/01/2009 ExitCare Patient Information 2013 [employer].  

## 2013-01-09 NOTE — MAU Note (Signed)
C/O sharp pain X 2 days in lower abdomen and around to R side. Intermittent watery/white D/C with no odor. Plans to get [facility] Has appointment scheduled.

## 2013-01-10 LAB — GC/CHLAMYDIA PROBE AMP
CT Probe RNA: POSITIVE — AB
GC Probe RNA: POSITIVE — AB

## 2013-01-11 NOTE — MAU Provider Note (Signed)
Attestation of Attending Supervision of Advanced Practitioner (CNM/NP): Evaluation and management procedures were performed by the Advanced Practitioner under my supervision and collaboration.  I have reviewed the Advanced Practitioner's note and chart, and I agree with the management and plan.  Neil Brickell 01/11/2013 12:11 PM   

## 2013-02-01 ENCOUNTER — Inpatient Hospital Stay (HOSPITAL_COMMUNITY)
Admission: AD | Admit: 2013-02-01 | Discharge: 2013-02-01 | Disposition: A | Discharge status: Home / Self Care | Payer: Medicaid Other | Source: Ambulatory Visit | Attending: Obstetrics | Admitting: Obstetrics

## 2013-02-01 ENCOUNTER — Encounter (HOSPITAL_COMMUNITY): Payer: Self-pay

## 2013-02-01 DIAGNOSIS — O210 Mild hyperemesis gravidarum: Secondary | ICD-10-CM | POA: Insufficient documentation

## 2013-02-01 LAB — COMPREHENSIVE METABOLIC PANEL
ALT: 6 U/L (ref 0–35)
AST: 14 U/L (ref 0–37)
Albumin: 3.7 g/dL (ref 3.5–5.2)
Calcium: 9.4 mg/dL (ref 8.4–10.5)
GFR calc Af Amer: >90 mL/min (ref >90)
Glucose, Bld: 90 mg/dL (ref 70–99)
Sodium: 132 mEq/L — ABNORMAL LOW (ref 135–145)
Total Protein: 7.3 g/dL (ref 6.0–8.3)

## 2013-02-01 LAB — URINALYSIS, ROUTINE W REFLEX MICROSCOPIC
Ketones, ur: 40 mg/dL — AB
Leukocytes, UA: NEGATIVE
Nitrite: NEGATIVE
Protein, ur: 30 mg/dL — AB
Urobilinogen, UA: 0.2 mg/dL (ref 0.0–1.0)

## 2013-02-01 LAB — CBC
MCH: 31.3 pg (ref 26.0–34.0)
MCHC: 35.6 g/dL (ref 30.0–36.0)
Platelets: 171 10*3/uL (ref 150–400)
RDW: 12.5 % (ref 11.5–15.5)

## 2013-02-01 LAB — URINE MICROSCOPIC-ADD ON

## 2013-02-01 MED ORDER — ONDANSETRON 4 MG PO TBDP: 4.0000 mg | ORAL_TABLET | Freq: Three times a day (TID) | ORAL | Status: DC | PRN

## 2013-02-01 MED ORDER — FAMOTIDINE 20 MG PO TABS: 20.0000 mg | ORAL_TABLET | Freq: Two times a day (BID) | ORAL | Status: DC | PRN

## 2013-02-01 MED ORDER — ONDANSETRON 8 MG PO TBDP
8.0000 mg | ORAL_TABLET | ORAL | Status: AC
  Administered 2013-02-01: 8 mg via ORAL
  Filled 2013-02-01: qty 1

## 2013-02-01 MED ORDER — PROMETHAZINE HCL 25 MG/ML IJ SOLN
25.0000 mg | Freq: Once | INTRAMUSCULAR | Status: AC
  Administered 2013-02-01: 25 mg via INTRAVENOUS
  Filled 2013-02-01: qty 1

## 2013-02-01 MED ORDER — FAMOTIDINE IN NACL 20-0.9 MG/50ML-% IV SOLN
20.0000 mg | INTRAVENOUS | Status: AC
  Administered 2013-02-01: 20 mg via INTRAVENOUS
  Filled 2013-02-01: qty 50

## 2013-02-01 MED ORDER — PROMETHAZINE HCL 25 MG PO TABS: 12.5000 mg | ORAL_TABLET | Freq: Four times a day (QID) | ORAL | Status: DC | PRN

## 2013-02-01 NOTE — MAU Provider Note (Signed)
History     CSN: [id number]  Arrival date and time: 02/01/13 4098   [facility] Initiated Contact with Patient 02/01/13 0915      Chief Complaint  Patient presents with  . Hyperemesis Gravidarum   HPI  Pt is a 27 y/o female [id number] [place] [redacted]w[redacted]d who presents today for hyperemesis for 3 days duration. She reports morning sickness throughout this pregnancy, however, it has gotten significantly worse over the past 3 days. She is unable to tolerate any solid food or any liquids, including water. She also affirms diarrhea. She has had similar symptoms in her previous pregnancies. The smell of food cooking is a trigger for her. She denies any unusual or poorly-cooked foods prior to this. No one else in her home is experiencing these symptoms.   She has taken a medication that she does not know the name of, which was called in by [facility] due to morning sickness, but says it dissolved in her mouth. She was prescribed one week's worth and has taken all that was prescribed. She says that it helped with her nausea/vomiting. She has an appointment with them on 02/08/13 for her initial prenatal visit.   ROS: General: positive for fatigue/weakness/lightheadedness and hot/cold flashes over past 3 days. No fever or chills. GI: She reports decrease in appetite, lower abdominal pain, substernal burning, vomiting, and diarrhea Respiratory: history of asthma well-controlled without medication. She reports shortness of breath following episodes of emesis  Past Medical History  Diagnosis Date  . Asthma   . Anemia   . Abnormal Pap smear   . Headache     Past Surgical History  Procedure Laterality Date  . Colposcopy    . Vaginal delivery      X3  . Wisdom tooth extraction      History reviewed. No pertinent family history.  History  Substance Use Topics  . Smoking status: Former Smoker    Types: Cigarettes    Quit date: 12/20/2012  . Smokeless tobacco: Never Used  . Alcohol Use: No     Allergies: No Known Allergies  Prescriptions prior to admission  Medication Sig Dispense Refill  . metroNIDAZOLE (FLAGYL) 500 MG tablet Take 1 tablet (500 mg total) by mouth 2 (two) times daily.  14 tablet  0    ROS Physical Exam   Blood pressure 112/67, pulse 72, temperature 97.1 F (36.2 C), temperature source Oral, resp. rate 16, height 5\' 5"  (1.651 m), weight 65.12 kg (143 lb 9 oz), last menstrual period 11/28/2012.  Physical Exam  Constitutional: She is oriented to person, place, and time. She appears well-developed and well-nourished. No distress.  HENT:  Mucous membranes moist and pink  Cardiovascular: Normal rate, regular rhythm, normal heart sounds and intact distal pulses.  Exam reveals no gallop and no friction rub.   No murmur heard. Respiratory: Effort normal and breath sounds normal. No respiratory distress. She has no wheezes. She has no rales.  GI: Soft. She exhibits no distension. There is no tenderness.  No bowel sounds auscultated  Neurological: She is alert and oriented to person, place, and time.  Skin: Skin is warm and dry. She is not diaphoretic.  No tenting on anterior forearm  Psychiatric: She has a normal mood and affect. Her behavior is normal. Judgment and thought content normal.    MAU Course  Procedures Results for orders placed during the hospital encounter of 02/01/13 (from the past 48 hour(s))  URINALYSIS, ROUTINE W REFLEX MICROSCOPIC     Status:  Abnormal   Collection Time    02/01/13  8:30 AM      Result Value Range   Color, Urine YELLOW  YELLOW   APPearance CLEAR  CLEAR   Specific Gravity, Urine >1.030 (*) 1.005 - 1.030   pH 6.0  5.0 - 8.0   Glucose, UA NEGATIVE  NEGATIVE mg/dL   Hgb urine dipstick TRACE (*) NEGATIVE   Bilirubin Urine SMALL (*) NEGATIVE   Ketones, ur 40 (*) NEGATIVE mg/dL   Protein, ur 30 (*) NEGATIVE mg/dL   Urobilinogen, UA 0.2  0.0 - 1.0 mg/dL   Nitrite NEGATIVE  NEGATIVE   Leukocytes, UA NEGATIVE  NEGATIVE   URINE MICROSCOPIC-ADD ON     Status: Abnormal   Collection Time    02/01/13  8:30 AM      Result Value Range   Squamous Epithelial / LPF MANY (*) RARE   RBC / HPF 0-2  <3 RBC/hpf   Urine-Other MUCOUS PRESENT    CBC     Status: Abnormal   Collection Time    02/01/13  8:58 AM      Result Value Range   WBC 7.0  4.0 - 10.5 K/uL   RBC 3.90  3.87 - 5.11 MIL/uL   Hemoglobin 12.2  12.0 - 15.0 g/dL   HCT 16.1 (*) 09.6 - 04.5 %   MCV 87.9  78.0 - 100.0 fL   MCH 31.3  26.0 - 34.0 pg   MCHC 35.6  30.0 - 36.0 g/dL   RDW 40.9  81.1 - 91.4 %   Platelets 171  150 - 400 K/uL  COMPREHENSIVE METABOLIC PANEL     Status: Abnormal   Collection Time    02/01/13  8:58 AM      Result Value Range   Sodium 132 (*) 135 - 145 mEq/L   Potassium 3.6  3.5 - 5.1 mEq/L   Chloride 98  96 - 112 mEq/L   CO2 21  19 - 32 mEq/L   Glucose, Bld 90  70 - 99 mg/dL   BUN 12  6 - 23 mg/dL   Creatinine, Ser 7.82  0.50 - 1.10 mg/dL   Calcium 9.4  8.4 - 95.6 mg/dL   Total Protein 7.3  6.0 - 8.3 g/dL   Albumin 3.7  3.5 - 5.2 g/dL   AST 14  0 - 37 U/L   ALT 6  0 - 35 U/L   Alkaline Phosphatase 39  39 - 117 U/L   Total Bilirubin 1.2  0.3 - 1.2 mg/dL   GFR calc non Af Amer >90  >90 mL/min   GFR calc Af Amer >90  >90 mL/min   Comment:            The eGFR has been calculated     using the CKD EPI equation.     This calculation has not been     validated in all clinical     situations.     eGFR's persistently     <90 mL/min signify     possible Chronic Kidney Disease.   Assessment and Plan  1. Vomiting in pregnancy, 1st trimester - 1 L D5 LR  - Promethazine IV  - Pepcid IV - D/C to home with PO promethazine, rectal promethazine, and Zantac PO - Follow-up with [facility] at initial prenatal visit on 3/17  Lorna Dibble 02/01/2013, 9:32 AM   I have seen this patient and agree with the above student's note.  Pt had normal bowel  sounds on my exam.    LEFTWICH-KIRBY, LISA Certified Nurse-Midwife

## 2013-02-01 NOTE — MAU Note (Signed)
Pt states feeling better. Resting in room. Rates nausea 2/10.

## 2013-02-01 NOTE — MAU Note (Signed)
Pt states unable to keep anything down for past 3 days, this is 4th pregnancy. Every pregnancy n/v gets worse. Having abd pain, feeling lightheaded intermittently.

## 2013-02-01 NOTE — MAU Note (Signed)
Pt actually discharged at 1319.

## 2013-02-04 NOTE — MAU Provider Note (Signed)
Attestation of Attending Supervision of Advanced Practitioner (CNM/NP): Evaluation and management procedures were performed by the Advanced Practitioner under my supervision and collaboration.  I have reviewed the Advanced Practitioner's note and chart, and I agree with the management and plan.  Tammy Cole 02/04/2013 8:09 AM

## 2013-02-08 ENCOUNTER — Encounter: Payer: Self-pay | Admitting: Obstetrics & Gynecology

## 2013-02-08 ENCOUNTER — Ambulatory Visit (INDEPENDENT_AMBULATORY_CARE_PROVIDER_SITE_OTHER): Payer: Medicaid Other | Admitting: *Deleted

## 2013-02-08 ENCOUNTER — Telehealth: Payer: Self-pay | Admitting: *Deleted

## 2013-02-08 VITALS — BP 105/56 | Temp 98.1°F | Wt 138.0 lb

## 2013-02-08 DIAGNOSIS — Z3480 Encounter for supervision of other normal pregnancy, unspecified trimester: Secondary | ICD-10-CM

## 2013-02-08 LAB — POCT URINALYSIS DIPSTICK
Bilirubin, UA: NEGATIVE
Glucose, UA: NEGATIVE
Nitrite, UA: POSITIVE
Spec Grav, UA: 1.02
pH, UA: 5

## 2013-02-08 MED ORDER — ERYTHROMYCIN BASE 500 MG PO TABS: 500.0000 mg | ORAL_TABLET | Freq: Two times a day (BID) | ORAL | Status: DC

## 2013-02-08 NOTE — Progress Notes (Signed)
Pulse-61 Patient urine positive for Nitrite. Per office protocol Erythromycin 500mg  1 by mouth twice daily x 7 days sent to pt pharmacy.

## 2013-02-08 NOTE — Telephone Encounter (Signed)
LMOMTCB x 1 to inform pt medication sent to pharmacy for urinary infection.

## 2013-02-08 NOTE — Patient Instructions (Addendum)
Return to office in 2 weeks 

## 2013-02-09 LAB — OBSTETRIC PANEL
Antibody Screen: NEGATIVE
Basophils Relative: 0 % (ref 0–1)
Eosinophils Absolute: 0 10*3/uL (ref 0.0–0.7)
Eosinophils Relative: 0 % (ref 0–5)
HCT: 33.2 % — ABNORMAL LOW (ref 36.0–46.0)
Hemoglobin: 11.6 g/dL — ABNORMAL LOW (ref 12.0–15.0)
Hepatitis B Surface Ag: NEGATIVE
Lymphs Abs: 2 10*3/uL (ref 0.7–4.0)
MCH: 30.8 pg (ref 26.0–34.0)
MCHC: 34.9 g/dL (ref 30.0–36.0)
MCV: 88.1 fL (ref 78.0–100.0)
Monocytes Absolute: 0.5 10*3/uL (ref 0.1–1.0)
Monocytes Relative: 7 % (ref 3–12)
Neutrophils Relative %: 63 % (ref 43–77)
RBC: 3.77 MIL/uL — ABNORMAL LOW (ref 3.87–5.11)
Rh Type: POSITIVE

## 2013-02-09 LAB — VARICELLA ZOSTER ANTIBODY, IGG: Varicella IgG: 2851 Index — ABNORMAL HIGH (ref <135.00)

## 2013-02-12 LAB — HEMOGLOBINOPATHY EVALUATION
Hemoglobin Other: 0 %
Hgb A2 Quant: 3 % (ref 2.2–3.2)
Hgb A: 96.5 % — ABNORMAL LOW (ref 96.8–97.8)
Hgb F Quant: 0.5 % (ref 0.0–2.0)
Hgb S Quant: 0 %

## 2013-02-22 ENCOUNTER — Ambulatory Visit (INDEPENDENT_AMBULATORY_CARE_PROVIDER_SITE_OTHER): Payer: Medicaid Other | Admitting: Obstetrics & Gynecology

## 2013-02-22 ENCOUNTER — Encounter: Payer: Medicaid Other | Admitting: Obstetrics & Gynecology

## 2013-02-22 VITALS — BP 121/67 | Temp 98.2°F | Wt 140.6 lb

## 2013-02-22 DIAGNOSIS — Z113 Encounter for screening for infections with a predominantly sexual mode of transmission: Secondary | ICD-10-CM

## 2013-02-22 DIAGNOSIS — E559 Vitamin D deficiency, unspecified: Secondary | ICD-10-CM

## 2013-02-22 DIAGNOSIS — Z3480 Encounter for supervision of other normal pregnancy, unspecified trimester: Secondary | ICD-10-CM

## 2013-02-22 DIAGNOSIS — N390 Urinary tract infection, site not specified: Secondary | ICD-10-CM

## 2013-02-22 DIAGNOSIS — N760 Acute vaginitis: Secondary | ICD-10-CM

## 2013-02-22 LAB — POCT URINALYSIS DIPSTICK
Glucose, UA: NEGATIVE
Nitrite, UA: NEGATIVE
Protein, UA: NEGATIVE
Spec Grav, UA: 1.025
Urobilinogen, UA: NEGATIVE

## 2013-02-22 NOTE — Progress Notes (Signed)
Pulse- 66.  Low back pain.  Pt states that she fainted on Tuesday - EMS told her that her blood pressure 70/40.

## 2013-02-23 LAB — WET PREP BY MOLECULAR PROBE
Candida species: NEGATIVE
Gardnerella vaginalis: POSITIVE — AB
Trichomonas vaginosis: NEGATIVE

## 2013-02-23 LAB — GC/CHLAMYDIA PROBE AMP: CT Probe RNA: NEGATIVE

## 2013-02-23 LAB — PAP IG W/ RFLX HPV ASCU

## 2013-02-26 ENCOUNTER — Encounter: Payer: Self-pay | Admitting: Obstetrics & Gynecology

## 2013-02-26 DIAGNOSIS — E559 Vitamin D deficiency, unspecified: Secondary | ICD-10-CM | POA: Insufficient documentation

## 2013-02-26 MED ORDER — NITROFURANTOIN MONOHYD MACRO 100 MG PO CAPS: 100.0000 mg | ORAL_CAPSULE | Freq: Two times a day (BID) | ORAL | Status: DC

## 2013-02-26 MED ORDER — VITAMIN D 1000 UNITS PO CAPS: 1000.0000 [IU] | ORAL_CAPSULE | Freq: Every day | ORAL | Status: DC

## 2013-02-26 MED ORDER — TINIDAZOLE 500 MG PO TABS: 1000.0000 mg | ORAL_TABLET | Freq: Every day | ORAL | Status: DC

## 2013-02-26 NOTE — Progress Notes (Signed)
Low back pain.  UTI.

## 2013-02-26 NOTE — Telephone Encounter (Signed)
Dr. Clearance Coots spoke with pt and pt aware of results and is going to call in different medication for UTI per sensitivity.

## 2013-03-21 ENCOUNTER — Inpatient Hospital Stay (HOSPITAL_COMMUNITY)
Admission: AD | Admit: 2013-03-21 | Discharge: 2013-03-21 | Disposition: A | Discharge status: Home / Self Care | Payer: Medicaid Other | Source: Ambulatory Visit | Attending: Obstetrics | Admitting: Obstetrics

## 2013-03-21 ENCOUNTER — Encounter (HOSPITAL_COMMUNITY): Payer: Self-pay | Admitting: *Deleted

## 2013-03-21 DIAGNOSIS — W108XXA Fall (on) (from) other stairs and steps, initial encounter: Secondary | ICD-10-CM | POA: Insufficient documentation

## 2013-03-21 DIAGNOSIS — Y929 Unspecified place or not applicable: Secondary | ICD-10-CM | POA: Insufficient documentation

## 2013-03-21 DIAGNOSIS — W19XXXA Unspecified fall, initial encounter: Secondary | ICD-10-CM

## 2013-03-21 DIAGNOSIS — R109 Unspecified abdominal pain: Secondary | ICD-10-CM | POA: Insufficient documentation

## 2013-03-21 DIAGNOSIS — O99891 Other specified diseases and conditions complicating pregnancy: Secondary | ICD-10-CM | POA: Insufficient documentation

## 2013-03-21 DIAGNOSIS — Y92009 Unspecified place in unspecified non-institutional (private) residence as the place of occurrence of the external cause: Secondary | ICD-10-CM

## 2013-03-21 DIAGNOSIS — R1084 Generalized abdominal pain: Secondary | ICD-10-CM
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
## 2013-03-22 ENCOUNTER — Encounter: Payer: Self-pay | Admitting: Obstetrics & Gynecology

## 2013-03-22 ENCOUNTER — Ambulatory Visit (INDEPENDENT_AMBULATORY_CARE_PROVIDER_SITE_OTHER): Payer: Medicaid Other | Admitting: Obstetrics & Gynecology

## 2013-03-22 VITALS — BP 100/63 | Temp 98.1°F | Wt 145.0 lb

## 2013-03-22 DIAGNOSIS — Z3480 Encounter for supervision of other normal pregnancy, unspecified trimester: Secondary | ICD-10-CM

## 2013-03-22 DIAGNOSIS — Z3482 Encounter for supervision of other normal pregnancy, second trimester: Secondary | ICD-10-CM

## 2013-03-22 LAB — POCT URINALYSIS DIPSTICK
Blood, UA: NEGATIVE
Nitrite, UA: NEGATIVE
Spec Grav, UA: 1.02
Urobilinogen, UA: NEGATIVE

## 2013-03-22 NOTE — Progress Notes (Signed)
Pulse- 81 Pt states she was seen at the [facility] yesterday. Pt states she fell down the stairs. Pt states everything check out well.

## 2013-03-22 NOTE — Progress Notes (Signed)
Doing well 

## 2013-03-22 NOTE — Patient Instructions (Addendum)
Pregnancy - Second Trimester The second trimester of pregnancy (3 to 6 months) is a period of rapid growth for you and your baby. At the end of the sixth month, your baby is about 9 inches long and weighs 1 1/2 pounds. You will begin to feel the baby move between 18 and 20 weeks of the pregnancy. This is called quickening. Weight gain is faster. A clear fluid (colostrum) may leak out of your breasts. You may feel small contractions of the womb (uterus). This is known as false labor or Braxton-Hicks contractions. This is like a practice for labor when the baby is ready to be born. Usually, the problems with morning sickness have usually passed by the end of your first trimester. Some women develop small dark blotches (called cholasma, mask of pregnancy) on their face that usually goes away after the baby is born. Exposure to the sun makes the blotches worse. Acne may also develop in some pregnant women and pregnant women who have acne, may find that it goes away. PRENATAL EXAMS  Blood work may continue to be done during prenatal exams. These tests are done to check on your health and the probable health of your baby. Blood work is used to follow your blood levels (hemoglobin). Anemia (low hemoglobin) is common during pregnancy. Iron and vitamins are given to help prevent this. You will also be checked for diabetes between 24 and 28 weeks of the pregnancy. Some of the previous blood tests may be repeated.  The size of the uterus is measured during each visit. This is to make sure that the baby is continuing to grow properly according to the dates of the pregnancy.  Your blood pressure is checked every prenatal visit. This is to make sure you are not getting toxemia.  Your urine is checked to make sure you do not have an infection, diabetes or protein in the urine.  Your weight is checked often to make sure gains are happening at the suggested rate. This is to ensure that both you and your baby are growing  normally.  Sometimes, an ultrasound is performed to confirm the proper growth and development of the baby. This is a test which bounces harmless sound waves off the baby so your caregiver can more accurately determine due dates. Sometimes, a specialized test is done on the amniotic fluid surrounding the baby. This test is called an amniocentesis. The amniotic fluid is obtained by sticking a needle into the belly (abdomen). This is done to check the chromosomes in instances where there is a concern about possible genetic problems with the baby. It is also sometimes done near the end of pregnancy if an early delivery is required. In this case, it is done to help make sure the baby's lungs are mature enough for the baby to live outside of the womb. CHANGES OCCURING IN THE SECOND TRIMESTER OF PREGNANCY Your body goes through many changes during pregnancy. They vary from person to person. Talk to your caregiver about changes you notice that you are concerned about.  During the second trimester, you will likely have an increase in your appetite. It is normal to have cravings for certain foods. This varies from person to person and pregnancy to pregnancy.  Your lower abdomen will begin to bulge.  You may have to urinate more often because the uterus and baby are pressing on your bladder. It is also common to get more bladder infections during pregnancy (pain with urination). You can help this by   drinking lots of fluids and emptying your bladder before and after intercourse.  You may begin to get stretch marks on your hips, abdomen, and breasts. These are normal changes in the body during pregnancy. There are no exercises or medications to take that prevent this change.  You may begin to develop swollen and bulging veins (varicose veins) in your legs. Wearing support hose, elevating your feet for 15 minutes, 3 to 4 times a day and limiting salt in your diet helps lessen the problem.  Heartburn may develop  as the uterus grows and pushes up against the stomach. Antacids recommended by your caregiver helps with this problem. Also, eating smaller meals 4 to 5 times a day helps.  Constipation can be treated with a stool softener or adding bulk to your diet. Drinking lots of fluids, vegetables, fruits, and whole grains are helpful.  Exercising is also helpful. If you have been very active up until your pregnancy, most of these activities can be continued during your pregnancy. If you have been less active, it is helpful to start an exercise program such as walking.  Hemorrhoids (varicose veins in the rectum) may develop at the end of the second trimester. Warm sitz baths and hemorrhoid cream recommended by your caregiver helps hemorrhoid problems.  Backaches may develop during this time of your pregnancy. Avoid heavy lifting, wear low heal shoes and practice good posture to help with backache problems.  Some pregnant women develop tingling and numbness of their hand and fingers because of swelling and tightening of ligaments in the wrist (carpel tunnel syndrome). This goes away after the baby is born.  As your breasts enlarge, you may have to get a bigger bra. Get a comfortable, cotton, support bra. Do not get a nursing bra until the last month of the pregnancy if you will be nursing the baby.  You may get a dark line from your belly button to the pubic area called the linea nigra.  You may develop rosy cheeks because of increase blood flow to the face.  You may develop spider looking lines of the face, neck, arms and chest. These go away after the baby is born. HOME CARE INSTRUCTIONS   It is extremely important to avoid all smoking, herbs, alcohol, and unprescribed drugs during your pregnancy. These chemicals affect the formation and growth of the baby. Avoid these chemicals throughout the pregnancy to ensure the delivery of a healthy infant.  Most of your home care instructions are the same as  suggested for the first trimester of your pregnancy. Keep your caregiver's appointments. Follow your caregiver's instructions regarding medication use, exercise and diet.  During pregnancy, you are providing food for you and your baby. Continue to eat regular, well-balanced meals. Choose foods such as meat, fish, milk and other low fat dairy products, vegetables, fruits, and whole-grain breads and cereals. Your caregiver will tell you of the ideal weight gain.  A physical sexual relationship may be continued up until near the end of pregnancy if there are no other problems. Problems could include early (premature) leaking of amniotic fluid from the membranes, vaginal bleeding, abdominal pain, or other medical or pregnancy problems.  Exercise regularly if there are no restrictions. Check with your caregiver if you are unsure of the safety of some of your exercises. The greatest weight gain will occur in the last 2 trimesters of pregnancy. Exercise will help you:  Control your weight.  Get you in shape for labor and delivery.  Lose weight   after you have the baby.  Wear a good support or jogging bra for breast tenderness during pregnancy. This may help if worn during sleep. Pads or tissues may be used in the bra if you are leaking colostrum.  Do not use hot tubs, steam rooms or saunas throughout the pregnancy.  Wear your seat belt at all times when driving. This protects you and your baby if you are in an accident.  Avoid raw meat, uncooked cheese, cat litter boxes and soil used by cats. These carry germs that can cause birth defects in the baby.  The second trimester is also a good time to visit your dentist for your dental health if this has not been done yet. Getting your teeth cleaned is OK. Use a soft toothbrush. Brush gently during pregnancy.  It is easier to loose urine during pregnancy. Tightening up and strengthening the pelvic muscles will help with this problem. Practice stopping your  urination while you are going to the bathroom. These are the same muscles you need to strengthen. It is also the muscles you would use as if you were trying to stop from passing gas. You can practice tightening these muscles up 10 times a set and repeating this about 3 times per day. Once you know what muscles to tighten up, do not perform these exercises during urination. It is more likely to contribute to an infection by backing up the urine.  Ask for help if you have financial, counseling or nutritional needs during pregnancy. Your caregiver will be able to offer counseling for these needs as well as refer you for other special needs.  Your skin may become oily. If so, wash your face with mild soap, use non-greasy moisturizer and oil or cream based makeup. MEDICATIONS AND DRUG USE IN PREGNANCY  Take prenatal vitamins as directed. The vitamin should contain 1 milligram of folic acid. Keep all vitamins out of reach of children. Only a couple vitamins or tablets containing iron may be fatal to a baby or young child when ingested.  Avoid use of all medications, including herbs, over-the-counter medications, not prescribed or suggested by your caregiver. Only take over-the-counter or prescription medicines for pain, discomfort, or fever as directed by your caregiver. Do not use aspirin.  Let your caregiver also know about herbs you may be using.  Alcohol is related to a number of birth defects. This includes fetal alcohol syndrome. All alcohol, in any form, should be avoided completely. Smoking will cause low birth rate and premature babies.  Street or illegal drugs are very harmful to the baby. They are absolutely forbidden. A baby born to an addicted mother will be addicted at birth. The baby will go through the same withdrawal an adult does. SEEK MEDICAL CARE IF:  You have any concerns or worries during your pregnancy. It is better to call with your questions if you feel they cannot wait, rather  than worry about them. SEEK IMMEDIATE MEDICAL CARE IF:   An unexplained oral temperature above 102 F (38.9 C) develops, or as your caregiver suggests.  You have leaking of fluid from the vagina (birth canal). If leaking membranes are suspected, take your temperature and tell your caregiver of this when you call.  There is vaginal spotting, bleeding, or passing clots. Tell your caregiver of the amount and how many pads are used. Light spotting in pregnancy is common, especially following intercourse.  You develop a bad smelling vaginal discharge with a change in the color from clear   to white.  You continue to feel sick to your stomach (nauseated) and have no relief from remedies suggested. You vomit blood or coffee ground-like materials.  You lose more than 2 pounds of weight or gain more than 2 pounds of weight over 1 week, or as suggested by your caregiver.  You notice swelling of your face, hands, feet, or legs.  You get exposed to German measles and have never had them.  You are exposed to fifth disease or chickenpox.  You develop belly (abdominal) pain. Round ligament discomfort is a common non-cancerous (benign) cause of abdominal pain in pregnancy. Your caregiver still must evaluate you.  You develop a bad headache that does not go away.  You develop fever, diarrhea, pain with urination, or shortness of breath.  You develop visual problems, blurry, or double vision.  You fall or are in a car accident or any kind of trauma.  There is mental or physical violence at home. Document Released: 11/02/2001 Document Revised: 01/31/2012 Document Reviewed: 05/07/2009 [employer] Patient Information 2013 [employer].  

## 2013-03-23 LAB — AFP, QUAD SCREEN
AFP: 29.7 IU/mL
Age Alone: 1:1030 {titer}
Curr Gest Age: 16.2 wks.days
Down Syndrome Scr Risk Est: 1:5270 {titer}
MoM for INH: 1.69
Trisomy 18 (Edward) Syndrome Interp.: 1:1490 {titer}

## 2013-04-03 ENCOUNTER — Other Ambulatory Visit: Payer: Self-pay | Admitting: Obstetrics & Gynecology

## 2013-04-03 DIAGNOSIS — Z1389 Encounter for screening for other disorder: Secondary | ICD-10-CM

## 2013-04-17 ENCOUNTER — Ambulatory Visit (INDEPENDENT_AMBULATORY_CARE_PROVIDER_SITE_OTHER): Payer: Medicaid Other

## 2013-04-17 ENCOUNTER — Other Ambulatory Visit: Payer: Medicaid Other

## 2013-04-17 ENCOUNTER — Encounter: Payer: Self-pay | Admitting: Obstetrics

## 2013-04-17 DIAGNOSIS — Z3480 Encounter for supervision of other normal pregnancy, unspecified trimester: Secondary | ICD-10-CM

## 2013-04-17 DIAGNOSIS — Z1389 Encounter for screening for other disorder: Secondary | ICD-10-CM

## 2013-04-17 LAB — US OB DETAIL + 14 WK

## 2013-04-25 ENCOUNTER — Encounter: Payer: Self-pay | Admitting: Obstetrics & Gynecology

## 2013-04-25 ENCOUNTER — Ambulatory Visit (INDEPENDENT_AMBULATORY_CARE_PROVIDER_SITE_OTHER): Payer: Medicaid Other | Admitting: Obstetrics & Gynecology

## 2013-04-25 VITALS — BP 100/64 | Temp 98.0°F | Wt 147.6 lb

## 2013-04-25 DIAGNOSIS — Z3480 Encounter for supervision of other normal pregnancy, unspecified trimester: Secondary | ICD-10-CM

## 2013-04-25 DIAGNOSIS — Z3482 Encounter for supervision of other normal pregnancy, second trimester: Secondary | ICD-10-CM

## 2013-04-25 LAB — POCT URINALYSIS DIPSTICK
Blood, UA: NEGATIVE
Ketones, UA: NEGATIVE
Protein, UA: NEGATIVE
Spec Grav, UA: 1.015
pH, UA: 6.5

## 2013-04-25 MED ORDER — CITRANATAL HARMONY 29-1-265 MG PO CAPS: 1.0000 | ORAL_CAPSULE | Freq: Every day | ORAL | Status: DC

## 2013-04-25 NOTE — Progress Notes (Signed)
Doing well 

## 2013-04-25 NOTE — Progress Notes (Signed)
Pulse- 87 

## 2013-04-25 NOTE — Patient Instructions (Addendum)
Glucose Tolerance Test This is a test to see how your body processes carbohydrates. This test is often done to check patients for diabetes or the possibility of developing it. PREPARATION FOR TEST You should have nothing to eat or drink 12 hours before the test. You will be given a form of sugar (glucose) and then blood samples will be drawn from your vein to determine the level of sugar in your blood. Alternatively, blood may be drawn from your finger for testing. You should not smoke or exercise during the test. NORMAL FINDINGS  Fasting: 70-115 mg/dL  30 minutes: less than 200 mg/dL  1 hour: less than 200 mg/dL  2 hours: less than 140 mg/dL  3 hours: 70-115 mg/dL  4 hours: 70-115 mg/dL Ranges for normal findings may vary among different laboratories and hospitals. You should always check with your doctor after having lab work or other tests done to discuss the meaning of your test results and whether your values are considered within normal limits. MEANING OF TEST Your caregiver will go over the test results with you and discuss the importance and meaning of your results, as well as treatment options and the need for additional tests. OBTAINING THE TEST RESULTS It is your responsibility to obtain your test results. Ask the lab or department performing the test when and how you will get your results. Document Released: 12/01/2004 Document Revised: 01/31/2012 Document Reviewed: 10/19/2008 [employer] Patient Information 2014 [employer].  

## 2013-05-14 ENCOUNTER — Encounter (HOSPITAL_COMMUNITY): Payer: Self-pay

## 2013-05-14 ENCOUNTER — Ambulatory Visit (INDEPENDENT_AMBULATORY_CARE_PROVIDER_SITE_OTHER): Payer: Medicaid Other | Admitting: Obstetrics & Gynecology

## 2013-05-14 ENCOUNTER — Encounter: Payer: Self-pay | Admitting: Obstetrics & Gynecology

## 2013-05-14 ENCOUNTER — Inpatient Hospital Stay (HOSPITAL_COMMUNITY)
Admission: AD | Admit: 2013-05-14 | Discharge: 2013-05-14 | Disposition: A | Discharge status: Home / Self Care | Payer: Medicaid Other | Source: Ambulatory Visit | Attending: Obstetrics & Gynecology | Admitting: Obstetrics & Gynecology

## 2013-05-14 VITALS — BP 105/67 | Temp 98.9°F | Wt 150.0 lb

## 2013-05-14 DIAGNOSIS — N949 Unspecified condition associated with female genital organs and menstrual cycle: Secondary | ICD-10-CM

## 2013-05-14 DIAGNOSIS — N760 Acute vaginitis: Secondary | ICD-10-CM | POA: Insufficient documentation

## 2013-05-14 DIAGNOSIS — Z3482 Encounter for supervision of other normal pregnancy, second trimester: Secondary | ICD-10-CM

## 2013-05-14 DIAGNOSIS — B9689 Other specified bacterial agents as the cause of diseases classified elsewhere: Secondary | ICD-10-CM

## 2013-05-14 DIAGNOSIS — R109 Unspecified abdominal pain: Secondary | ICD-10-CM | POA: Insufficient documentation

## 2013-05-14 DIAGNOSIS — A499 Bacterial infection, unspecified: Secondary | ICD-10-CM | POA: Insufficient documentation

## 2013-05-14 DIAGNOSIS — O2390 Unspecified genitourinary tract infection in pregnancy, unspecified trimester: Secondary | ICD-10-CM | POA: Insufficient documentation

## 2013-05-14 DIAGNOSIS — O368190 Decreased fetal movements, unspecified trimester, not applicable or unspecified: Secondary | ICD-10-CM | POA: Insufficient documentation

## 2013-05-14 DIAGNOSIS — Z3480 Encounter for supervision of other normal pregnancy, unspecified trimester: Secondary | ICD-10-CM

## 2013-05-14 HISTORY — DX: Gonococcal infection, unspecified: A54.9

## 2013-05-14 HISTORY — DX: Chlamydial infection, unspecified: A74.9

## 2013-05-14 LAB — URINE MICROSCOPIC-ADD ON

## 2013-05-14 LAB — POCT URINALYSIS DIPSTICK
Bilirubin, UA: NEGATIVE
Ketones, UA: NEGATIVE
pH, UA: 7

## 2013-05-14 LAB — URINALYSIS, ROUTINE W REFLEX MICROSCOPIC
Glucose, UA: NEGATIVE mg/dL
Ketones, ur: NEGATIVE mg/dL
Leukocytes, UA: NEGATIVE
pH: 6.5 (ref 5.0–8.0)

## 2013-05-14 LAB — WET PREP, GENITAL

## 2013-05-14 LAB — OB RESULTS CONSOLE GC/CHLAMYDIA
Chlamydia: NEGATIVE
Gonorrhea: NEGATIVE

## 2013-05-14 MED ORDER — METRONIDAZOLE 500 MG PO TABS: 500.0000 mg | ORAL_TABLET | Freq: Two times a day (BID) | ORAL | Status: DC

## 2013-05-14 MED ORDER — TRAMADOL HCL 50 MG PO TABS: 50.0000 mg | ORAL_TABLET | Freq: Four times a day (QID) | ORAL | Status: DC | PRN

## 2013-05-14 NOTE — MAU Provider Note (Signed)
History     CSN: [id number]  Arrival date and time: 05/14/13 0737   None     Chief Complaint  Patient presents with  . Abdominal Pain  . Decreased Fetal Movement   HPI 27 y.o. [id number] at [redacted]w[redacted]d with low abd cramping around 8 PM last night, went to bed and slept until 5 AM, woke up and still felt pain. No pain now, hurts with movement and position change. Ongoing issues with pelvic pain this pregnancy. No bleeding, LOF, discharge. Did not feel movement this morning from about 5 - 8 AM, now feeling lots of movement.   Past Medical History  Diagnosis Date  . Asthma   . Anemia   . Abnormal Pap smear   . Headache(784.0)   . Onset of menses     age of 40, regular, lasting 3 to 4 days, medium to heavy flow  . Gonorrhea   . Chlamydia     Past Surgical History  Procedure Laterality Date  . Colposcopy    . Vaginal delivery      X3  . Wisdom tooth extraction      Family History  Problem Relation Age of Onset  . Arthritis    . Asthma    . Glaucoma    . Heart disease    . Hypertension    . Prostate cancer    . Cataracts    . COPD Father   . Prostate cancer Father   . Glaucoma Father   . Cataracts Father     History  Substance Use Topics  . Smoking status: Former Smoker    Types: Cigarettes    Quit date: 12/20/2012  . Smokeless tobacco: Never Used  . Alcohol Use: No    Allergies: No Known Allergies  Prescriptions prior to admission  Medication Sig Dispense Refill  . acetaminophen (TYLENOL) 500 MG tablet Take 500 mg by mouth every 6 (six) hours as needed for pain.      . Prenatal Vit-Fe Fumarate-FA (PRENATAL MULTIVITAMIN) TABS Take 1 tablet by mouth daily at 12 noon.        Review of Systems  Constitutional: Negative.   Respiratory: Negative.   Cardiovascular: Negative.   Gastrointestinal: Positive for abdominal pain. Negative for nausea, vomiting, diarrhea and constipation.  Genitourinary: Negative for dysuria, urgency, frequency, hematuria and flank pain.        Negative for vaginal bleeding, cramping/contractions  Musculoskeletal: Negative.   Neurological: Negative.   Psychiatric/Behavioral: Negative.    Physical Exam   Pulse 52, temperature 98.3 F (36.8 C), temperature source Oral, resp. rate 16, height 5\' 4"  (1.626 m), weight 151 lb 9.6 oz (68.765 kg), last menstrual period 11/28/2012, SpO2 100.00%.  Physical Exam  Nursing note and vitals reviewed. Constitutional: She is oriented to person, place, and time. She appears well-developed and well-nourished. No distress.  Cardiovascular: Normal rate.   Respiratory: Effort normal.  GI: Soft. There is no tenderness.  Genitourinary: Vaginal discharge (white) found.  Dilation: Closed Exam by:: Otilio Miu, CNM   Musculoskeletal: Normal range of motion.  Neurological: She is alert and oriented to person, place, and time.  Skin: Skin is warm and dry.  Psychiatric: She has a normal mood and affect.    MAU Course  Procedures Results for orders placed during the hospital encounter of 05/14/13 (from the past 24 hour(s))  URINALYSIS, ROUTINE W REFLEX MICROSCOPIC     Status: Abnormal   Collection Time    05/14/13  7:50 AM  Result Value Range   Color, Urine YELLOW  YELLOW   APPearance CLEAR  CLEAR   Specific Gravity, Urine 1.020  1.005 - 1.030   pH 6.5  5.0 - 8.0   Glucose, UA NEGATIVE  NEGATIVE mg/dL   Hgb urine dipstick TRACE (*) NEGATIVE   Bilirubin Urine NEGATIVE  NEGATIVE   Ketones, ur NEGATIVE  NEGATIVE mg/dL   Protein, ur NEGATIVE  NEGATIVE mg/dL   Urobilinogen, UA 0.2  0.0 - 1.0 mg/dL   Nitrite NEGATIVE  NEGATIVE   Leukocytes, UA NEGATIVE  NEGATIVE  URINE MICROSCOPIC-ADD ON     Status: Abnormal   Collection Time    05/14/13  7:50 AM      Result Value Range   Squamous Epithelial / LPF FEW (*) RARE   RBC / HPF 0-2  <3 RBC/hpf   Bacteria, UA RARE  RARE   Urine-Other MUCOUS PRESENT    WET PREP, GENITAL     Status: Abnormal   Collection Time    05/14/13  9:00 AM       Result Value Range   Yeast Wet Prep HPF POC NONE SEEN  NONE SEEN   Trich, Wet Prep NONE SEEN  NONE SEEN   Clue Cells Wet Prep HPF POC FEW (*) NONE SEEN   WBC, Wet Prep HPF POC FEW (*) NONE SEEN     Assessment and Plan   1. Round ligament pain   2. BV (bacterial vaginosis)   Pain seems c/w RLP, no evidence of contractions. Comfort measures and precautions rev'd. Flagyl for BV. F/U as scheduled.     Medication List    TAKE these medications       acetaminophen 500 MG tablet  Commonly known as:  TYLENOL  Take 500 mg by mouth every 6 (six) hours as needed for pain.     metroNIDAZOLE 500 MG tablet  Commonly known as:  FLAGYL  Take 1 tablet (500 mg total) by mouth 2 (two) times daily.     prenatal multivitamin Tabs  Take 1 tablet by mouth daily at 12 noon.            Follow-up Information   Follow up with [facility]. (as scheduled)    Contact information:   [address], [address] [place] [address] [phone]        Georges Mouse 05/14/2013, 8:14 AM

## 2013-05-14 NOTE — MAU Note (Signed)
Patient states she has been having abdominal pain and has not felt the baby move today.

## 2013-05-14 NOTE — Progress Notes (Signed)
Pulse 59- patient c/o increase in pelvic pain for the past 3-4 weeks . Patient states the pain is causing difficulty with walking Subjective:     Tammy Cole is a 27 y.o.  Pregnant female who presents for evaluation of pelvic pain. The pain is described as shooting, stabbing and tingling, and is 8/10 in intensity. Pain is located in the pelvic and groin area area with radiation to the back and buttock. Onset was 3-4 weeks ago occurring daily weeks ago. Symptoms have been gradually worsening since. Aggravating factors: walking. Alleviating factors: laying down. Associated symptoms: diarrhea and flatus. The patient denies belching, fever, frequency, nausea and vomiting. Risk factors for pelvic/abdominal pain include recent diagnosis of bacterial vaginosis.   The following portions of the patient's history were reviewed and updated as appropriate: allergies, current medications, past family history, past medical history, past social history, past surgical history and problem list.   Review of Systems Pertinent items are noted in HPI.    Objective:    BP 105/67  Temp(Src) 98.9 F (37.2 C)  Wt 150 lb (68.04 kg)  BMI 25.73 kg/m2  LMP 11/28/2012 General:   alert and no distress  Lungs:   not done  Heart:   not done  Abdomen:  normal findings: soft, non-tender  CVA:   absent  Pelvis:  Exam deferred.  Extremities:   extremities normal, atraumatic, no cyanosis or edema  Neurologic:   Alert and oriented x3. Gait normal. Reflexes and motor strength normal and symmetric. Cranial nerves 2-12 and sensation grossly intact.  Psychiatric:   normal mood, behavior, speech, dress, and thought processes   Lab Review Labs: Urine Dip   Imaging None      Assessment:    Pregnancy: intra-uterine pregnancy and Pelvic symphysis separation. Symptomatic    Plan:    The diagnosis was discussed with the patient and evaluation and treatment plans outlined. Ultram Rx.

## 2013-05-15 LAB — GC/CHLAMYDIA PROBE AMP
CT Probe RNA: NEGATIVE
GC Probe RNA: NEGATIVE

## 2013-05-23 ENCOUNTER — Other Ambulatory Visit: Payer: Medicaid Other

## 2013-05-23 ENCOUNTER — Ambulatory Visit (INDEPENDENT_AMBULATORY_CARE_PROVIDER_SITE_OTHER): Payer: Medicaid Other | Admitting: Obstetrics & Gynecology

## 2013-05-23 VITALS — BP 110/65 | Temp 97.9°F | Wt 148.0 lb

## 2013-05-23 DIAGNOSIS — Z3482 Encounter for supervision of other normal pregnancy, second trimester: Secondary | ICD-10-CM

## 2013-05-23 DIAGNOSIS — Z3480 Encounter for supervision of other normal pregnancy, unspecified trimester: Secondary | ICD-10-CM

## 2013-05-23 LAB — CBC
HCT: 30.5 % — ABNORMAL LOW (ref 36.0–46.0)
Hemoglobin: 10.2 g/dL — ABNORMAL LOW (ref 12.0–15.0)
MCV: 91.9 fL (ref 78.0–100.0)
RBC: 3.32 MIL/uL — ABNORMAL LOW (ref 3.87–5.11)
WBC: 5.4 10*3/uL (ref 4.0–10.5)

## 2013-05-23 LAB — POCT URINALYSIS DIPSTICK
Bilirubin, UA: NEGATIVE
Glucose, UA: NEGATIVE
Nitrite, UA: POSITIVE

## 2013-05-23 MED ORDER — SULFAMETHOXAZOLE-TRIMETHOPRIM 800-160 MG PO TABS: 1.0000 | ORAL_TABLET | Freq: Two times a day (BID) | ORAL | Status: DC

## 2013-05-23 NOTE — Progress Notes (Signed)
Pulse-72 No complaints.

## 2013-05-23 NOTE — Patient Instructions (Signed)
Tetanus, Diphtheria, Pertussis (Tdap) Vaccine What You Need to Know WHY GET VACCINATED? Tetanus, diphtheria and pertussis can be very serious diseases, even for adolescents and adults. Tdap vaccine can protect us from these diseases. TETANUS (Lockjaw) causes painful muscle tightening and stiffness, usually all over the body.  It can lead to tightening of muscles in the head and neck so you can't open your mouth, swallow, or sometimes even breathe. Tetanus kills about 1 out of 5 people who are infected. DIPHTHERIA can cause a thick coating to form in the back of the throat.  It can lead to breathing problems, paralysis, heart failure, and death. PERTUSSIS (Whooping Cough) causes severe coughing spells, which can cause difficulty breathing, vomiting and disturbed sleep.  It can also lead to weight loss, incontinence, and rib fractures. Up to 2 in 100 adolescents and 5 in 100 adults with pertussis are hospitalized or have complications, which could include pneumonia and death. These diseases are caused by bacteria. Diphtheria and pertussis are spread from person to person through coughing or sneezing. Tetanus enters the body through cuts, scratches, or wounds. Before vaccines, the [place] saw as many as 200,000 cases a year of diphtheria and pertussis, and hundreds of cases of tetanus. Since vaccination began, tetanus and diphtheria have dropped by about 99% and pertussis by about 80%. TDAP VACCINE Tdap vaccine can protect adolescents and adults from tetanus, diphtheria, and pertussis. One dose of Tdap is routinely given at age 11 or 12. People who did not get Tdap at that age should get it as soon as possible. Tdap is especially important for health care professionals and anyone having close contact with a baby younger than 12 months. Pregnant women should get a dose of Tdap during every pregnancy, to protect the newborn from pertussis. Infants are most at risk for severe, life-threatening  complications from pertussis. A similar vaccine, called Td, protects from tetanus and diphtheria, but not pertussis. A Td booster should be given every 10 years. Tdap may be given as one of these boosters if you have not already gotten a dose. Tdap may also be given after a severe cut or burn to prevent tetanus infection. Your doctor can give you more information. Tdap may safely be given at the same time as other vaccines. SOME PEOPLE SHOULD NOT GET THIS VACCINE  If you ever had a life-threatening allergic reaction after a dose of any tetanus, diphtheria, or pertussis containing vaccine, OR if you have a severe allergy to any part of this vaccine, you should not get Tdap. Tell your doctor if you have any severe allergies.  If you had a coma, or long or multiple seizures within 7 days after a childhood dose of DTP or DTaP, you should not get Tdap, unless a cause other than the vaccine was found. You can still get Td.  Talk to your doctor if you:  have epilepsy or another nervous system problem,  had severe pain or swelling after any vaccine containing diphtheria, tetanus or pertussis,  ever had Guillain-Barr Syndrome (GBS),  aren't feeling well on the day the shot is scheduled. RISKS OF A VACCINE REACTION With any medicine, including vaccines, there is a chance of side effects. These are usually mild and go away on their own, but serious reactions are also possible. Brief fainting spells can follow a vaccination, leading to injuries from falling. Sitting or lying down for about 15 minutes can help prevent these. Tell your doctor if you feel dizzy or light-headed, or   have vision changes or ringing in the ears. Mild problems following Tdap (Did not interfere with activities)  Pain where the shot was given (about 3 in 4 adolescents or 2 in 3 adults)  Redness or swelling where the shot was given (about 1 person in 5)  Mild fever of at least 100.51F (up to about 1 in 25 adolescents or 1 in  100 adults)  Headache (about 3 or 4 people in 10)  Tiredness (about 1 person in 3 or 4)  Nausea, vomiting, diarrhea, stomach ache (up to 1 in 4 adolescents or 1 in 10 adults)  Chills, body aches, sore joints, rash, swollen glands (uncommon) Moderate problems following Tdap (Interfered with activities, but did not require medical attention)  Pain where the shot was given (about 1 in 5 adolescents or 1 in 100 adults)  Redness or swelling where the shot was given (up to about 1 in 16 adolescents or 1 in 25 adults)  Fever over 102F (about 1 in 100 adolescents or 1 in 250 adults)  Headache (about 3 in 20 adolescents or 1 in 10 adults)  Nausea, vomiting, diarrhea, stomach ache (up to 1 or 3 people in 100)  Swelling of the entire arm where the shot was given (up to about 3 in 100). Severe problems following Tdap (Unable to perform usual activities, required medical attention)  Swelling, severe pain, bleeding and redness in the arm where the shot was given (rare). A severe allergic reaction could occur after any vaccine (estimated less than 1 in a million doses). WHAT IF THERE IS A SERIOUS REACTION? What should I look for?  Look for anything that concerns you, such as signs of a severe allergic reaction, very high fever, or behavior changes. Signs of a severe allergic reaction can include hives, swelling of the face and throat, difficulty breathing, a fast heartbeat, dizziness, and weakness. These would start a few minutes to a few hours after the vaccination. What should I do?  If you think it is a severe allergic reaction or other emergency that can't wait, call 9-1-1 or get the person to the nearest hospital. Otherwise, call your doctor.  Afterward, the reaction should be reported to the "Vaccine Adverse Event Reporting System" (VAERS). Your doctor might file this report, or you can do it yourself through the VAERS web site at www.vaers.LAgents.no, or by calling 1-(704) 138-0877. VAERS is  only for reporting reactions. They do not give medical advice.  THE NATIONAL VACCINE INJURY COMPENSATION PROGRAM The National Vaccine Injury Compensation Program (VICP) is a federal program that was created to compensate people who may have been injured by certain vaccines. Persons who believe they may have been injured by a vaccine can learn about the program and about filing a claim by calling 1-(954)162-3406 or visiting the VICP website at SpiritualWord.at. HOW CAN I LEARN MORE?  Ask your doctor.  Call your local or state health department.  Contact the Centers for Disease Control and Prevention (CDC):  Call (937) 836-1482 or visit CDC's website at PicCapture.uy. CDC Tdap Vaccine VIS (03/30/12) Document Released: 05/09/2012 Document Revised: 08/02/2012 Document Reviewed: 05/09/2012 [employer] Patient Information 2014 [place], [place]. Sterilization Information, Female Female sterilization is a procedure to permanently prevent pregnancy. There are different ways to perform sterilization, but all either block or close the fallopian tubes so that your eggs cannot reach your uterus. If your egg cannot reach your uterus, sperm cannot fertilize the egg, and you cannot get pregnant.  Sterilization is performed by a surgical procedure.  Sometimes these procedures are performed in a hospital while a patient is asleep. Sometimes they can be done in a clinic setting with the patient awake. The fallopian tubes can be surgically cut, tied, or sealed through a procedure called tubal ligation. The fallopian tubes can also be closed with clips or rings. Sterilization can also be done by placing a tiny coil into each fallopian tube, which causes scar tissue to grow inside the tube. The scar tissue then blocks the tubes.  Discuss sterilization with your caregiver to answer any concerns you or your partner may have. You may want to ask what type of sterilization your caregiver performs. Some  caregivers may not perform all the various options. Sterilization is permanent and should only be done if you are sure you do not want children or do not want any more children. Having a sterilization reversed may not be successful.  STERILIZATION PROCEDURES  Laparoscopic sterilization. This is a surgical method performed at a time other than right after childbirth. Two incisions are made in the lower abdomen. A thin, lighted tube (laparoscope) is inserted into one of the incisions and is used to perform the procedure. The fallopian tubes are closed with a ring or a clip. An instrument that uses heat could be used to seal the tubes closed (electrocautery).   Mini-laparotomy. This is a surgical method done 1 or 2 days after giving birth. Typically, a small incision is made just below the belly button (umbilicus) and the fallopian tubes are exposed. The tubes can then be sealed, tied, or cut.   Hysteroscopic sterilization. This is performed at a time other than right after childbirth. A tiny, spring-like coil is inserted through the cervix and uterus and placed into the fallopian tubes. The coil causes scaring and blocks the tubes. Other forms of contraception should be used for 3 months after the procedure to allow the scar tissue to form completely. Additionally, it is required hysterosalpingography be done 3 months later to ensure that the procedure was successful. Hysterosalpingography is a procedure that uses X-rays to look at your uterus and fallopian tubes after a material to make them show up better has been inserted. IS STERILIZATION SAFE? Sterilization is considered safe with very rare complications. Risks depend on the type of procedure you have. As with any surgical procedure, there are risks. Some risks of sterilization by any means include:   Bleeding.  Infection.  Reaction to anesthesia medicine.  Injury to surrounding organs. Risks specific to having hysteroscopic coils placed  include:  The coils may not be placed correctly the first time.   The coils may move out of place.   The tubes may not get completely blocked after 3 months.   Injury to surrounding organs when placing the coil.  HOW EFFECTIVE IS FEMALE STERILIZATION? Sterilization is nearly 100% effective, but it can fail. Depending on the type of sterilization, the rate of failure can be as high as 3%. After hysteroscopic sterilization with placement of fallopian tube coils, you will need back-up birth control for 3 months after the procedure. Sterilization is effective for a lifetime.  BENEFITS OF STERILIZATION  It does not affect your hormones, and therefore will not affect your menstrual periods, sexual desire, or performance.   It is effective for a lifetime.   It is safe.   You do not need to worry about getting pregnant. Keep in mind that if you had the hysteroscopic placement procedure, you must wait 3 months after the procedure (or  until your caregiver confirms) before pregnancy is not considered possible.   There are no side effects unlike other types of birth control (contraception).  DRAWBACKS OF STERILIZATION  You must be sure you do not want children or any more children. The procedure is permanent.   It does not provide protection against sexually transmitted infections (STIs).   The tubes can grow back together. If this happens, there is a risk of pregnancy. There is also an increased risk (50%) of pregnancy being an ectopic pregnancy. This is a pregnancy that happens outside of the uterus. Document Released: 04/26/2008 Document Revised: 05/09/2012 Document Reviewed: 02/24/2012 [facility] Patient Information 2014 [place], [place]. Levonorgestrel intrauterine device (IUD) What is this medicine? LEVONORGESTREL IUD (LEE voe nor jes trel) is a contraceptive (birth control) device. The device is placed inside the uterus by a healthcare professional. It is used to prevent  pregnancy and can also be used to treat heavy bleeding that occurs during your period. Depending on the device, it can be used for 3 to 5 years. This medicine may be used for other purposes; ask your health care provider or pharmacist if you have questions. What should I tell my health care provider before I take this medicine? They need to know if you have any of these conditions: -abnormal Pap smear -cancer of the breast, uterus, or cervix -diabetes -endometritis -genital or pelvic infection now or in the past -have more than one sexual partner or your partner has more than one partner -heart disease -history of an ectopic or tubal pregnancy -immune system problems -IUD in place -liver disease or tumor -problems with blood clots or take blood-thinners -use intravenous drugs -uterus of unusual shape -vaginal bleeding that has not been explained -an unusual or allergic reaction to levonorgestrel, other hormones, silicone, or polyethylene, medicines, foods, dyes, or preservatives -pregnant or trying to get pregnant -breast-feeding How should I use this medicine? This device is placed inside the uterus by a health care professional. Talk to your pediatrician regarding the use of this medicine in children. Special care may be needed. Overdosage: If you think you have taken too much of this medicine contact a poison control center or emergency room at once. NOTE: This medicine is only for you. Do not share this medicine with others. What if I miss a dose? This does not apply. What may interact with this medicine? Do not take this medicine with any of the following medications: -amprenavir -bosentan -fosamprenavir This medicine may also interact with the following medications: -aprepitant -barbiturate medicines for inducing sleep or treating seizures -bexarotene -griseofulvin -medicines to treat seizures like carbamazepine, ethotoin, felbamate, oxcarbazepine, phenytoin,  topiramate -modafinil -pioglitazone -rifabutin -rifampin -rifapentine -some medicines to treat HIV infection like atazanavir, indinavir, lopinavir, nelfinavir, tipranavir, ritonavir -St. John's wort -warfarin This list may not describe all possible interactions. Give your health care provider a list of all the medicines, herbs, non-prescription drugs, or dietary supplements you use. Also tell them if you smoke, drink alcohol, or use illegal drugs. Some items may interact with your medicine. What should I watch for while using this medicine? Visit your doctor or health care professional for regular check ups. See your doctor if you or your partner has sexual contact with others, becomes HIV positive, or gets a sexual transmitted disease. This product does not protect you against HIV infection (AIDS) or other sexually transmitted diseases. You can check the placement of the IUD yourself by reaching up to the top of your vagina with clean fingers  to feel the threads. Do not pull on the threads. It is a good habit to check placement after each menstrual period. Call your doctor right away if you feel more of the IUD than just the threads or if you cannot feel the threads at all. The IUD may come out by itself. You may become pregnant if the device comes out. If you notice that the IUD has come out use a backup birth control method like condoms and call your health care provider. Using tampons will not change the position of the IUD and are okay to use during your period. What side effects may I notice from receiving this medicine? Side effects that you should report to your doctor or health care professional as soon as possible: -allergic reactions like skin rash, itching or hives, swelling of the face, lips, or tongue -fever, flu-like symptoms -genital sores -high blood pressure -no menstrual period for 6 weeks during use -pain, swelling, warmth in the leg -pelvic pain or tenderness -severe or  sudden headache -signs of pregnancy -stomach cramping -sudden shortness of breath -trouble with balance, talking, or walking -unusual vaginal bleeding, discharge -yellowing of the eyes or skin Side effects that usually do not require medical attention (report to your doctor or health care professional if they continue or are bothersome): -acne -breast pain -change in sex drive or performance -changes in weight -cramping, dizziness, or faintness while the device is being inserted -headache -irregular menstrual bleeding within first 3 to 6 months of use -nausea This list may not describe all possible side effects. Call your doctor for medical advice about side effects. You may report side effects to FDA at 1-800-FDA-1088. Where should I keep my medicine? This does not apply. NOTE: This sheet is a summary. It may not cover all possible information. If you have questions about this medicine, talk to your doctor, pharmacist, or health care provider.  2013, [employer]vier/[employer] Standard. (12/09/2011 1:54:04 PM)

## 2013-05-24 LAB — RPR

## 2013-05-25 LAB — CULTURE, OB URINE: Colony Count: >=100000

## 2013-06-05 ENCOUNTER — Ambulatory Visit: Payer: Medicaid Other | Admitting: Obstetrics

## 2013-06-05 ENCOUNTER — Encounter: Payer: Self-pay | Admitting: Obstetrics

## 2013-06-05 ENCOUNTER — Ambulatory Visit (INDEPENDENT_AMBULATORY_CARE_PROVIDER_SITE_OTHER): Payer: Medicaid Other | Admitting: Obstetrics

## 2013-06-05 VITALS — BP 101/64 | Wt 147.0 lb

## 2013-06-05 VITALS — BP 101/64 | HR 57 | Wt 147.0 lb

## 2013-06-05 DIAGNOSIS — Z3480 Encounter for supervision of other normal pregnancy, unspecified trimester: Secondary | ICD-10-CM

## 2013-06-05 DIAGNOSIS — Z3482 Encounter for supervision of other normal pregnancy, second trimester: Secondary | ICD-10-CM

## 2013-06-05 LAB — POCT URINALYSIS DIPSTICK
Bilirubin, UA: NEGATIVE
Ketones, UA: NEGATIVE
Nitrite, UA: NEGATIVE

## 2013-06-05 NOTE — Progress Notes (Signed)
Doing well 

## 2013-06-05 NOTE — Progress Notes (Signed)
Pulse- 57 Pt states she is having trouble with pressure in lower abd. Pt states she has some braxton hicks.  Pt states she has had discharge that was clear, sticky discharge.

## 2013-06-07 ENCOUNTER — Encounter: Payer: Medicaid Other | Admitting: Obstetrics & Gynecology

## 2013-06-08 ENCOUNTER — Telehealth: Payer: Self-pay | Admitting: *Deleted

## 2013-06-13 ENCOUNTER — Encounter: Payer: Self-pay | Admitting: Obstetrics

## 2013-06-13 ENCOUNTER — Ambulatory Visit (INDEPENDENT_AMBULATORY_CARE_PROVIDER_SITE_OTHER): Payer: Medicaid Other | Admitting: Obstetrics

## 2013-06-13 VITALS — BP 98/63 | Temp 98.0°F | Wt 146.0 lb

## 2013-06-13 DIAGNOSIS — Z3483 Encounter for supervision of other normal pregnancy, third trimester: Secondary | ICD-10-CM

## 2013-06-13 DIAGNOSIS — Z3480 Encounter for supervision of other normal pregnancy, unspecified trimester: Secondary | ICD-10-CM

## 2013-06-13 LAB — POCT URINALYSIS DIPSTICK
Bilirubin, UA: NEGATIVE
Glucose, UA: NEGATIVE
Leukocytes, UA: NEGATIVE
Nitrite, UA: NEGATIVE
Urobilinogen, UA: NEGATIVE
pH, UA: 8

## 2013-06-13 NOTE — Progress Notes (Signed)
p-58 Pt states she has been having about 4-5 contractions a day. Pt states they are getting stronger. Pt states she is having a headaches daily.

## 2013-06-25 ENCOUNTER — Inpatient Hospital Stay (HOSPITAL_COMMUNITY)
Admission: AD | Admit: 2013-06-25 | Discharge: 2013-06-25 | Disposition: A | Discharge status: Home / Self Care | Payer: Medicaid Other | Source: Ambulatory Visit | Attending: Obstetrics & Gynecology | Admitting: Obstetrics & Gynecology

## 2013-06-25 ENCOUNTER — Encounter (HOSPITAL_COMMUNITY): Payer: Self-pay | Admitting: *Deleted

## 2013-06-25 DIAGNOSIS — O26893 Other specified pregnancy related conditions, third trimester: Secondary | ICD-10-CM

## 2013-06-25 DIAGNOSIS — N898 Other specified noninflammatory disorders of vagina: Secondary | ICD-10-CM

## 2013-06-25 DIAGNOSIS — O479 False labor, unspecified: Secondary | ICD-10-CM

## 2013-06-25 DIAGNOSIS — O4700 False labor before 37 completed weeks of gestation, unspecified trimester: Secondary | ICD-10-CM | POA: Insufficient documentation

## 2013-06-25 DIAGNOSIS — N949 Unspecified condition associated with female genital organs and menstrual cycle: Secondary | ICD-10-CM | POA: Insufficient documentation

## 2013-06-25 DIAGNOSIS — O4703 False labor before 37 completed weeks of gestation, third trimester: Secondary | ICD-10-CM

## 2013-06-25 LAB — URINALYSIS, ROUTINE W REFLEX MICROSCOPIC
Bilirubin Urine: NEGATIVE
Nitrite: NEGATIVE
Protein, ur: NEGATIVE mg/dL
Specific Gravity, Urine: 1.02 (ref 1.005–1.030)
Urobilinogen, UA: 1 mg/dL (ref 0.0–1.0)

## 2013-06-25 MED ORDER — TERBUTALINE SULFATE 1 MG/ML IJ SOLN
0.2500 mg | Freq: Once | INTRAMUSCULAR | Status: AC
  Administered 2013-06-25: 0.25 mg via SUBCUTANEOUS
  Filled 2013-06-25: qty 1

## 2013-06-25 NOTE — Telephone Encounter (Signed)
Error

## 2013-06-25 NOTE — MAU Note (Signed)
C/o leaking clear fluid after she vomited; hx of preterm labor and preterm delivery; c/o ucs and vaginal pressure since yesterday afternoon around 1730;

## 2013-06-25 NOTE — MAU Provider Note (Signed)
Chief Complaint:  Rupture of Membranes and Labor Eval   First Provider Initiated Contact with Patient 06/25/13 1754     HPI: Tammy Cole is a 27 y.o. [id number] at [redacted]w[redacted]d who presents to maternity admissions reporting one episode of leaking clear fluid w/ vomiting at 1630 today and increased pelvic pressure and contractions since yesterday afternoon. Hx PTD at 32-34 weeks followed by 2 term deliveries. Cervix closed on last exam per pt.    Denies vaginal bleeding. Good fetal movement.   Past Medical History: Past Medical History  Diagnosis Date  . Asthma   . Anemia   . Abnormal Pap smear   . Headache(784.0)   . Preterm delivery at 32-34 weeks   . Gonorrhea   . Chlamydia     Past obstetric history: OB History   Grav Para Term Preterm Abortions TAB SAB Ect Mult Living   5 3 2 1 1  1   3      # Outc Date GA Lbr Len/2nd Wgt Sex Del Anes PTL Lv   1 PRE 1999 [redacted]w[redacted]d  2.013kg(4lb7oz)        2 TRM 2010           3 TRM 2011           4 SAB            5 CUR               Past Surgical History: Past Surgical History  Procedure Laterality Date  . Colposcopy    . Vaginal delivery      X3  . Wisdom tooth extraction      Family History: Family History  Problem Relation Age of Onset  . Arthritis    . Asthma    . Glaucoma    . Heart disease    . Hypertension    . Prostate cancer    . Cataracts    . COPD Father   . Prostate cancer Father   . Glaucoma Father   . Cataracts Father     Social History: History  Substance Use Topics  . Smoking status: Former Smoker    Types: Cigarettes    Quit date: 12/20/2012  . Smokeless tobacco: Never Used  . Alcohol Use: No    Allergies: No Known Allergies  Meds:  Prescriptions prior to admission  Medication Sig Dispense Refill  . acetaminophen (TYLENOL) 500 MG tablet Take 500 mg by mouth every 6 (six) hours as needed for pain.      . Prenatal Vit-Fe Fumarate-FA (PRENATAL MULTIVITAMIN) TABS Take 1 tablet by mouth daily at 12 noon.       . sulfamethoxazole-trimethoprim (BACTRIM DS,SEPTRA DS) 800-160 MG per tablet Take 1 tablet by mouth 2 (two) times daily.  14 tablet  0    ROS: Pos for pelvic pressure, leaking fluid x 1, vomiting x 1, contractions. Neg for fever, chills, urinary complaints, continued leaking, vaginal discharge.   Physical Exam  Blood pressure 92/58, pulse 104, temperature 98.2 F (36.8 C), temperature source Oral, resp. rate 18, last menstrual period 11/28/2012. GENERAL: Well-developed, well-nourished female in mild distress.  HEENT: normocephalic HEART: normal rate RESP: normal effort ABDOMEN: Soft, non-tender, gravid appropriate for gestational age EXTREMITIES: Nontender, no edema NEURO: alert and oriented SPECULUM EXAM: NEFG, physiologic discharge, neg pool, no blood, cervix clean Dilation: Fingertip (FT outer os only; inner os closed) Effacement (%): Thick Station: -2 Presentation: Vertex Exam by:: Ivonne Andrew CNM Dilation: Fingertip (FT outer os only;  inner os closed) Effacement (%): Thick Station: -2 Presentation: Vertex Exam by:: Ivonne Andrew CNM  FHT:  Baseline 150 , moderate variability, accelerations present, no decelerations Contractions: q irreg, mild mins   Labs: Results for orders placed during the hospital encounter of 06/25/13 (from the past 24 hour(s))  FETAL FIBRONECTIN     Status: None   Collection Time    06/25/13  6:10 PM      Result Value Range   Fetal Fibronectin NEGATIVE  NEGATIVE  URINALYSIS, ROUTINE W REFLEX MICROSCOPIC     Status: Abnormal   Collection Time    06/25/13  6:54 PM      Result Value Range   Color, Urine YELLOW  YELLOW   APPearance CLEAR  CLEAR   Specific Gravity, Urine 1.020  1.005 - 1.030   pH 8.0  5.0 - 8.0   Glucose, UA NEGATIVE  NEGATIVE mg/dL   Hgb urine dipstick NEGATIVE  NEGATIVE   Bilirubin Urine NEGATIVE  NEGATIVE   Ketones, ur >80 (*) NEGATIVE mg/dL   Protein, ur NEGATIVE  NEGATIVE mg/dL   Urobilinogen, UA 1.0  0.0 - 1.0 mg/dL   Nitrite  NEGATIVE  NEGATIVE   Leukocytes, UA NEGATIVE  NEGATIVE  Fern neg  Imaging:  NA  MAU Course: UC's resolved, pelvic pressure decreased w/ terb and PO fluids. Declines repeat VE.   Assessment: 1. Preterm contractions, third trimester   2. Vaginal discharge in pregnancy in third trimester    Plan: Discharge home in stable condition. Preterm labor precautions and fetal kick counts. Pelvic rest x 1 week. Increase fluids and rest.     Follow-up Information   Follow up with [facility] On 06/28/2013. (as scheduled or as needed if symptoms worsen)    Contact information:   [address] [place] [address] [phone]       Medication List         acetaminophen 500 MG tablet  Commonly known as:  TYLENOL  Take 500 mg by mouth every 6 (six) hours as needed for pain.     prenatal multivitamin Tabs tablet  Take 1 tablet by mouth daily at 12 noon.     sulfamethoxazole-trimethoprim 800-160 MG per tablet  Commonly known as:  BACTRIM DS,SEPTRA DS  Take 1 tablet by mouth 2 (two) times daily.        [place], [place] 06/25/2013 7:53 PM

## 2013-06-26 ENCOUNTER — Inpatient Hospital Stay (HOSPITAL_COMMUNITY)
Admission: AD | Admit: 2013-06-26 | Discharge: 2013-06-26 | Disposition: A | Discharge status: Home / Self Care | Payer: Medicaid Other | Source: Ambulatory Visit | Attending: Obstetrics | Admitting: Obstetrics

## 2013-06-26 ENCOUNTER — Encounter (HOSPITAL_COMMUNITY): Payer: Self-pay | Admitting: *Deleted

## 2013-06-26 DIAGNOSIS — O4700 False labor before 37 completed weeks of gestation, unspecified trimester: Secondary | ICD-10-CM | POA: Insufficient documentation

## 2013-06-26 DIAGNOSIS — O479 False labor, unspecified: Secondary | ICD-10-CM

## 2013-06-26 LAB — URINALYSIS, ROUTINE W REFLEX MICROSCOPIC
Bilirubin Urine: NEGATIVE
Ketones, ur: >80 mg/dL — AB
Leukocytes, UA: NEGATIVE
Nitrite: NEGATIVE
Specific Gravity, Urine: 1.02 (ref 1.005–1.030)
Urobilinogen, UA: 0.2 mg/dL (ref 0.0–1.0)

## 2013-06-26 NOTE — MAU Note (Signed)
Patient is in with c/o painful and regular contractions (she states that she could not determine the frequency). She denies vaginal bleeding or lof. She reports good fetal movement. Patient was seen yesterday for preterm contractions, with negative fetal fibronectin.

## 2013-06-26 NOTE — MAU Note (Signed)
Was seen in MAU last night. Received Terb. FFN negative. States started having UC's again around 0030-0100.

## 2013-06-26 NOTE — MAU Provider Note (Signed)
History     CSN: [id number]  Arrival date and time: 06/26/13 9147   First Provider Initiated Contact with Patient 06/26/13 1010      Chief Complaint  Patient presents with  . Contractions   HPI Comments: Ms. Donahoe presents to the MAU clinic today for evaluation of preterm contractions.  She was evaluated yesterday in the MAU for the same problem, and was discharged to home after evaluation at that time.  She currently reports her contractions began at 12 am. They last approximately 10-15 seconds.  She describes them as cramping pain, and quantifies the pain as a 3 to 4 out of ten.  She has not tried any medication to alleviate her pain. She reports a history of preterm labor, with her previous three children all being delivered at 35 to 36 weeks.  She reports some leukorrhea yesterday at 3:30 pm, but states that it has not continued.  She denies recent sex or inserting anything into her vagina over the last 24 hours.  She denies bleeding.  She has increased her physical activity over the last few weeks due to moving apartments.  She also reports some pregnancy nausea, vomiting, and a decrease in appetite over the last two days.     OB History   Grav Para Term Preterm Abortions TAB SAB Ect Mult Living   5 3 2 1 1  1   3       Past Medical History  Diagnosis Date  . Asthma   . Anemia   . Abnormal Pap smear   . Headache(784.0)   . Onset of menses     age of 30, regular, lasting 3 to 4 days, medium to heavy flow  . Gonorrhea   . Chlamydia     Past Surgical History  Procedure Laterality Date  . Colposcopy    . Vaginal delivery      X3  . Wisdom tooth extraction      Family History  Problem Relation Age of Onset  . Arthritis    . Asthma    . Glaucoma    . Heart disease    . Hypertension    . Prostate cancer    . Cataracts    . COPD Father   . Prostate cancer Father   . Glaucoma Father   . Cataracts Father     History  Substance Use Topics  . Smoking status: Former  Smoker    Types: Cigarettes    Quit date: 12/20/2012  . Smokeless tobacco: Never Used  . Alcohol Use: No    Allergies: No Known Allergies  No prescriptions prior to admission    Review of Systems  Constitutional: Negative for fever, chills and diaphoresis.  Eyes: Negative for blurred vision.  Gastrointestinal: Positive for nausea and vomiting.  Genitourinary: Negative for dysuria, urgency and frequency.  Skin: Negative.   Neurological: Negative for dizziness and headaches.   Physical Exam   Blood pressure 103/55, pulse 63, temperature 96.9 F (36.1 C), temperature source Oral, resp. rate 19, last menstrual period 11/28/2012.  Physical Exam  Constitutional: She is oriented to person, place, and time. She appears well-developed and well-nourished. No distress.  Respiratory: Effort normal and breath sounds normal.  Neurological: She is alert and oriented to person, place, and time.  Skin: Skin is warm and dry. She is not diaphoretic.  Psychiatric: She has a normal mood and affect. Her behavior is normal. Judgment and thought content normal.   Dilation: Fingertip Effacement (%): Thick  Cervical Position: Posterior Exam by:: LCraige Cotta, CNM  EFM with FHR baseline 145, moderate variability, accels present (15x15), no decels Toco with occasional irregular contractions, mild to palpation  MAU Course  Procedures    Assessment and Plan  A: -Preterm contractions  P: -Education with clinical nurse Midwife Sharen Counter -Discharge to home [place], [place] L 06/26/2013, 10:54 AM   I have seen this patient and agree with the above student's note.   Pt evaluated 06/25/13 for leakage of fluid and contractions and had negative FFN and ROM was ruled out at that time. Cervical exam today, 06/26/13, unchanged.   PTL precautions given. Discussed normal discomforts of pregnancy with pt. Recommend pregnancy support belt. Pt has one but is not wearing it. Pt to f/u in office with Dr  Clearance Coots on Thursday as scheduled.   LEFTWICH-KIRBY, Tawnie Ehresman Certified Nurse-Midwife

## 2013-06-28 ENCOUNTER — Encounter: Payer: Self-pay | Admitting: Obstetrics

## 2013-06-28 ENCOUNTER — Ambulatory Visit (INDEPENDENT_AMBULATORY_CARE_PROVIDER_SITE_OTHER): Payer: Medicaid Other | Admitting: Obstetrics

## 2013-06-28 VITALS — BP 108/75 | Temp 98.7°F | Wt 147.8 lb

## 2013-06-28 DIAGNOSIS — Z3483 Encounter for supervision of other normal pregnancy, third trimester: Secondary | ICD-10-CM

## 2013-06-28 DIAGNOSIS — Z3480 Encounter for supervision of other normal pregnancy, unspecified trimester: Secondary | ICD-10-CM

## 2013-06-28 DIAGNOSIS — A6004 Herpesviral vulvovaginitis: Secondary | ICD-10-CM

## 2013-06-28 LAB — POCT URINALYSIS DIPSTICK
Blood, UA: NEGATIVE
Spec Grav, UA: 1.015
Urobilinogen, UA: 4

## 2013-06-28 MED ORDER — VALACYCLOVIR HCL 1 G PO TABS: ORAL_TABLET | ORAL | Status: DC

## 2013-06-28 NOTE — Progress Notes (Signed)
Pulse- 53.  Patient states that she is having a herpes outbreak.

## 2013-06-29 ENCOUNTER — Encounter: Payer: Self-pay | Admitting: Obstetrics

## 2013-07-05 ENCOUNTER — Inpatient Hospital Stay (HOSPITAL_COMMUNITY)
Admission: AD | Admit: 2013-07-05 | Discharge: 2013-07-05 | Disposition: A | Discharge status: Home / Self Care | Payer: Medicaid Other | Source: Ambulatory Visit | Attending: Obstetrics | Admitting: Obstetrics

## 2013-07-05 ENCOUNTER — Encounter (HOSPITAL_COMMUNITY): Payer: Self-pay | Admitting: *Deleted

## 2013-07-05 DIAGNOSIS — O4700 False labor before 37 completed weeks of gestation, unspecified trimester: Secondary | ICD-10-CM | POA: Insufficient documentation

## 2013-07-05 DIAGNOSIS — E860 Dehydration: Secondary | ICD-10-CM | POA: Insufficient documentation

## 2013-07-05 DIAGNOSIS — R55 Syncope and collapse: Secondary | ICD-10-CM

## 2013-07-05 DIAGNOSIS — O2650 Maternal hypotension syndrome, unspecified trimester: Secondary | ICD-10-CM | POA: Insufficient documentation

## 2013-07-05 LAB — COMPREHENSIVE METABOLIC PANEL
ALT: 5 U/L (ref 0–35)
BUN: 8 mg/dL (ref 6–23)
CO2: 20 mEq/L (ref 19–32)
Calcium: 8.8 mg/dL (ref 8.4–10.5)
Creatinine, Ser: 0.67 mg/dL (ref 0.50–1.10)
GFR calc Af Amer: >90 mL/min (ref >90)
GFR calc non Af Amer: >90 mL/min (ref >90)
Glucose, Bld: 74 mg/dL (ref 70–99)

## 2013-07-05 LAB — URINALYSIS, ROUTINE W REFLEX MICROSCOPIC
Bilirubin Urine: NEGATIVE
Glucose, UA: NEGATIVE mg/dL
Hgb urine dipstick: NEGATIVE
Ketones, ur: >80 mg/dL — AB
Protein, ur: NEGATIVE mg/dL

## 2013-07-05 LAB — CBC
Hemoglobin: 10.4 g/dL — ABNORMAL LOW (ref 12.0–15.0)
MCHC: 35.5 g/dL (ref 30.0–36.0)
RBC: 3.28 MIL/uL — ABNORMAL LOW (ref 3.87–5.11)

## 2013-07-05 MED ORDER — DEXTROSE 5 % IN LACTATED RINGERS IV BOLUS
1000.0000 mL | Freq: Once | INTRAVENOUS | Status: AC
  Administered 2013-07-05: 1000 mL via INTRAVENOUS

## 2013-07-05 NOTE — MAU Provider Note (Signed)
History     CSN: [id number]  Arrival date and time: 07/05/13 1148   First Provider Initiated Contact with Patient 07/05/13 1211      Chief Complaint  Patient presents with  . Labor Eval   HPI  Pt is [redacted]w[redacted]d pregnant and presents with contractions and also had light headedness.  Pt has shooting sharp pains that started yesterday- different from previous pelvic pain.  Pt denies vaginal discharge, itching, burning or LOF or bleeding.  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 Asthma   . Anemia   . Abnormal Pap smear   . Headache(784.0)   . Onset of menses     age of 72, regular, lasting 3 to 4 days, medium to heavy flow  . Gonorrhea   . Chlamydia   . Herpes     Past Surgical History  Procedure Laterality Date  . Colposcopy    . Vaginal delivery      X3  . Wisdom tooth extraction      Family History  Problem Relation Age of Onset  . Arthritis    . Asthma    . Glaucoma    . Heart disease    . Hypertension    . Prostate cancer    . Cataracts    . COPD Father   . Prostate cancer Father   . Glaucoma Father   . Cataracts Father     History  Substance Use Topics  . Smoking status: Former Smoker    Types: Cigarettes    Quit date: 12/20/2012  . Smokeless tobacco: Never Used  . Alcohol Use: No    Allergies: No Known Allergies  Prescriptions prior to admission  Medication Sig Dispense Refill  . acetaminophen (TYLENOL) 500 MG tablet Take 500 mg by mouth every 6 (six) hours as needed for pain.      . Prenatal Vit-Fe Fumarate-FA (PRENATAL MULTIVITAMIN) TABS Take 1 tablet by mouth daily at 12 noon.      . valACYclovir (VALTREX) 1000 MG tablet Take 1 tab po bid x 5 days prn each outbreak, then 1 tab po daily for suppression.  40 tablet  11    Review of Systems  Constitutional: Negative for fever and chills.  Gastrointestinal: Positive for abdominal pain. Negative for nausea, vomiting, diarrhea and  constipation.       Uterine irritability  Neurological: Negative for headaches.   Physical Exam   Blood pressure 98/69, pulse 108, temperature 98 F (36.7 C), temperature source Oral, resp. rate 18, height 5\' 4"  (1.626 m), weight 65.318 kg (144 lb), last menstrual period 11/28/2012, SpO2 100.00%.  Physical Exam  Nursing note and vitals reviewed. Constitutional: She is oriented to person, place, and time. She appears well-developed and well-nourished. No distress.  HENT:  Head: Normocephalic.  Eyes: Pupils are equal, round, and reactive to light.  Neck: Normal range of motion. Neck supple.  Cardiovascular: Normal rate.   Respiratory: Effort normal. No respiratory distress.  GI: Soft. She exhibits no distension. There is no tenderness. There is no rebound and no guarding.  Uterine irritability; FHR reactive  Musculoskeletal: Normal range of motion.  Neurological: She is alert and oriented to person, place, and time.  Skin: Skin is warm and dry.  Psychiatric: She has a normal mood and affect.    MAU Course  Procedures Results for orders placed during the hospital encounter of 07/05/13 (from the past  24 hour(s))  URINALYSIS, ROUTINE W REFLEX MICROSCOPIC     Status: Abnormal   Collection Time    07/05/13 12:00 PM      Result Value Range   Color, Urine YELLOW  YELLOW   APPearance CLEAR  CLEAR   Specific Gravity, Urine 1.020  1.005 - 1.030   pH 6.0  5.0 - 8.0   Glucose, UA NEGATIVE  NEGATIVE mg/dL   Hgb urine dipstick NEGATIVE  NEGATIVE   Bilirubin Urine NEGATIVE  NEGATIVE   Ketones, ur >80 (*) NEGATIVE mg/dL   Protein, ur NEGATIVE  NEGATIVE mg/dL   Urobilinogen, UA 0.2  0.0 - 1.0 mg/dL   Nitrite NEGATIVE  NEGATIVE   Leukocytes, UA NEGATIVE  NEGATIVE  COMPREHENSIVE METABOLIC PANEL     Status: Abnormal   Collection Time    07/05/13 12:27 PM      Result Value Range   Sodium 133 (*) 135 - 145 mEq/L   Potassium 3.8  3.5 - 5.1 mEq/L   Chloride 100  96 - 112 mEq/L   CO2 20  19  - 32 mEq/L   Glucose, Bld 74  70 - 99 mg/dL   BUN 8  6 - 23 mg/dL   Creatinine, Ser 1.19  0.50 - 1.10 mg/dL   Calcium 8.8  8.4 - 14.7 mg/dL   Total Protein 6.2  6.0 - 8.3 g/dL   Albumin 3.0 (*) 3.5 - 5.2 g/dL   AST 14  0 - 37 U/L   ALT 5  0 - 35 U/L   Alkaline Phosphatase 130 (*) 39 - 117 U/L   Total Bilirubin 0.8  0.3 - 1.2 mg/dL   GFR calc non Af Amer >90  >90 mL/min   GFR calc Af Amer >90  >90 mL/min  CBC     Status: Abnormal   Collection Time    07/05/13 12:27 PM      Result Value Range   WBC 5.7  4.0 - 10.5 K/uL   RBC 3.28 (*) 3.87 - 5.11 MIL/uL   Hemoglobin 10.4 (*) 12.0 - 15.0 g/dL   HCT 82.9 (*) 56.2 - 13.0 %   MCV 89.3  78.0 - 100.0 fL   MCH 31.7  26.0 - 34.0 pg   MCHC 35.5  30.0 - 36.0 g/dL   RDW 86.5  78.4 - 69.6 %   Platelets 129 (*) 150 - 400 K/uL  uterine irritability dimished after IVF Pt tolerating PO fluids Discussed with Dr. Clearance Coots  Assessment and Plan  Pregnancy Near syncope episode- small frequent high protein foods discussed; avoid sugary foods Dehydration- push PO fluids F/u with scheduled appointment  Eurydice Calixto 07/05/2013, 12:13 PM

## 2013-07-05 NOTE — MAU Note (Signed)
Pt states she started having U/C's last night but this morning she states they have been every 11-15 min. Apart and became light headed at Pediatrician's office today when they called EMS.  No syncopal episode.  Denies vaginal bleeding or ROM.  Good fetal movement.

## 2013-07-12 ENCOUNTER — Encounter: Payer: Medicaid Other | Admitting: Obstetrics

## 2013-07-16 ENCOUNTER — Encounter: Payer: Self-pay | Admitting: Obstetrics

## 2013-07-16 ENCOUNTER — Ambulatory Visit (INDEPENDENT_AMBULATORY_CARE_PROVIDER_SITE_OTHER): Payer: Medicaid Other | Admitting: Obstetrics

## 2013-07-16 VITALS — BP 107/68 | Temp 98.2°F | Wt 157.8 lb

## 2013-07-16 DIAGNOSIS — Z3480 Encounter for supervision of other normal pregnancy, unspecified trimester: Secondary | ICD-10-CM

## 2013-07-16 DIAGNOSIS — Z3483 Encounter for supervision of other normal pregnancy, third trimester: Secondary | ICD-10-CM

## 2013-07-16 NOTE — Progress Notes (Signed)
Pulse: 58

## 2013-07-24 ENCOUNTER — Encounter (HOSPITAL_COMMUNITY): Payer: Self-pay | Admitting: *Deleted

## 2013-07-24 ENCOUNTER — Inpatient Hospital Stay (HOSPITAL_COMMUNITY)
Admission: AD | Admit: 2013-07-24 | Discharge: 2013-07-24 | Disposition: A | Discharge status: Home / Self Care | Payer: Medicaid Other | Source: Ambulatory Visit | Attending: Obstetrics | Admitting: Obstetrics

## 2013-07-24 DIAGNOSIS — R109 Unspecified abdominal pain: Secondary | ICD-10-CM | POA: Insufficient documentation

## 2013-07-24 DIAGNOSIS — A6000 Herpesviral infection of urogenital system, unspecified: Secondary | ICD-10-CM | POA: Insufficient documentation

## 2013-07-24 DIAGNOSIS — O479 False labor, unspecified: Secondary | ICD-10-CM

## 2013-07-24 DIAGNOSIS — O4700 False labor before 37 completed weeks of gestation, unspecified trimester: Secondary | ICD-10-CM | POA: Insufficient documentation

## 2013-07-24 DIAGNOSIS — O98519 Other viral diseases complicating pregnancy, unspecified trimester: Secondary | ICD-10-CM | POA: Insufficient documentation

## 2013-07-24 LAB — URINALYSIS, ROUTINE W REFLEX MICROSCOPIC
Glucose, UA: NEGATIVE mg/dL
Protein, ur: NEGATIVE mg/dL
Urobilinogen, UA: 0.2 mg/dL (ref 0.0–1.0)

## 2013-07-24 LAB — WET PREP, GENITAL
Trich, Wet Prep: NONE SEEN
Yeast Wet Prep HPF POC: NONE SEEN

## 2013-07-24 LAB — URINE MICROSCOPIC-ADD ON

## 2013-07-24 MED ORDER — FLUCONAZOLE 150 MG PO TABS
150.0000 mg | ORAL_TABLET | Freq: Once | ORAL | Status: AC
  Administered 2013-07-24: 150 mg via ORAL
  Filled 2013-07-24: qty 1

## 2013-07-24 NOTE — MAU Provider Note (Signed)
History     CSN: [id number]  Arrival date and time: 07/24/13 0941   First Provider Initiated Contact with Patient 07/24/13 1019      Chief Complaint  Patient presents with  . Abdominal Pain   HPI  Pt is a [id number] here at [redacted]w[redacted]d here with report of intermittent abdominal pain that started 4 days ago.  No report of vaginal leaking or bleeding.  Vaginal irritation.  +history of herpes, last outbreak a "few" weeks ago.  Treated with medication by Dr. Clearance Coots.  +fetal movement.    Past Medical History  Diagnosis Date  . Asthma   . Anemia   . Abnormal Pap smear   . Headache(784.0)   . Onset of menses     age of 52, regular, lasting 3 to 4 days, medium to heavy flow  . Gonorrhea   . Chlamydia   . Herpes     Last outbreak August 2014    Past Surgical History  Procedure Laterality Date  . Colposcopy    . Vaginal delivery      X3  . Wisdom tooth extraction      Family History  Problem Relation Age of Onset  . Arthritis    . Asthma    . Glaucoma    . Heart disease    . Hypertension    . Prostate cancer    . Cataracts    . COPD Father   . Prostate cancer Father   . Glaucoma Father   . Cataracts Father     History  Substance Use Topics  . Smoking status: Former Smoker    Types: Cigarettes    Quit date: 12/20/2012  . Smokeless tobacco: Never Used  . Alcohol Use: No    Allergies: No Known Allergies  Prescriptions prior to admission  Medication Sig Dispense Refill  . Prenatal Vit-Fe Fumarate-FA (PRENATAL MULTIVITAMIN) TABS Take 1 tablet by mouth daily.         Review of Systems  Gastrointestinal: Positive for abdominal pain (contractions).  Genitourinary:       Vaginal irritation  All other systems reviewed and are negative.   Physical Exam   Blood pressure 111/68, pulse 62, temperature 98.2 F (36.8 C), temperature source Oral, resp. rate 16, height 5\' 4"  (1.626 m), weight 73.029 kg (161 lb), last menstrual period 11/28/2012, SpO2 98.00%.  Physical Exam   Constitutional: She is oriented to person, place, and time. She appears well-developed and well-nourished. No distress.  HENT:  Head: Normocephalic.  Neck: Normal range of motion. Neck supple.  Cardiovascular: Normal rate, regular rhythm and normal heart sounds.   Respiratory: Effort normal and breath sounds normal.  GI: Soft. There is no tenderness.  Genitourinary: There is no lesion on the right labia. There is no lesion on the left labia. No bleeding around the vagina. Vaginal discharge (mucusy) found.  Musculoskeletal: Normal range of motion. She exhibits no edema.  Neurological: She is alert and oriented to person, place, and time.  Skin: Skin is warm and dry.  Dilation: Fingertip Effacement (%): Thick Cervical Position: Posterior Station: Ballotable Presentation: Vertex Exam by:: Roney Marion, CNM   MAU Course  Procedures Results for orders placed during the hospital encounter of 07/24/13 (from the past 24 hour(s))  URINALYSIS, ROUTINE W REFLEX MICROSCOPIC     Status: Abnormal   Collection Time    07/24/13  9:55 AM      Result Value Range   Color, Urine YELLOW  YELLOW   APPearance CLEAR  CLEAR   Specific Gravity, Urine 1.015  1.005 - 1.030   pH 6.5  5.0 - 8.0   Glucose, UA NEGATIVE  NEGATIVE mg/dL   Hgb urine dipstick NEGATIVE  NEGATIVE   Bilirubin Urine NEGATIVE  NEGATIVE   Ketones, ur NEGATIVE  NEGATIVE mg/dL   Protein, ur NEGATIVE  NEGATIVE mg/dL   Urobilinogen, UA 0.2  0.0 - 1.0 mg/dL   Nitrite NEGATIVE  NEGATIVE   Leukocytes, UA TRACE (*) NEGATIVE  URINE MICROSCOPIC-ADD ON     Status: Abnormal   Collection Time    07/24/13  9:55 AM      Result Value Range   Squamous Epithelial / LPF RARE  RARE   WBC, UA 3-6  <3 WBC/hpf   Bacteria, UA FEW (*) RARE   Wet prep - negative  Assessment and Plan  [redacted]w[redacted]d [id number] - History of Preterm Delivery Braxton Hicks Category I FHR Tracing HSV II   Plan: Discharge to home Reviewed preterm labor precautions Keep  scheduled appointment. Continue acyclovir.  [facility] 07/24/2013, 10:24 AM

## 2013-07-24 NOTE — MAU Note (Signed)
Patient is in with c/o pelvic/vaginal pressure that increases in intensity. Patient also c/o vaginal irritation without discharge. Patient states that she have mild non-painful contractions. Reports good fetal movement. Denies vaginal bleeding or lof.

## 2013-07-26 LAB — URINE CULTURE

## 2013-08-02 ENCOUNTER — Encounter (HOSPITAL_COMMUNITY): Payer: Self-pay | Admitting: *Deleted

## 2013-08-02 ENCOUNTER — Encounter: Payer: Self-pay | Admitting: Obstetrics

## 2013-08-02 ENCOUNTER — Inpatient Hospital Stay (HOSPITAL_COMMUNITY)
Admission: AD | Admit: 2013-08-02 | Discharge: 2013-08-02 | Disposition: A | Discharge status: Home / Self Care | Payer: Medicaid Other | Source: Ambulatory Visit | Attending: Obstetrics & Gynecology | Admitting: Obstetrics & Gynecology

## 2013-08-02 ENCOUNTER — Ambulatory Visit (INDEPENDENT_AMBULATORY_CARE_PROVIDER_SITE_OTHER): Payer: Medicaid Other | Admitting: Obstetrics

## 2013-08-02 DIAGNOSIS — O4700 False labor before 37 completed weeks of gestation, unspecified trimester: Secondary | ICD-10-CM | POA: Insufficient documentation

## 2013-08-02 DIAGNOSIS — Z3483 Encounter for supervision of other normal pregnancy, third trimester: Secondary | ICD-10-CM

## 2013-08-02 DIAGNOSIS — Z3480 Encounter for supervision of other normal pregnancy, unspecified trimester: Secondary | ICD-10-CM

## 2013-08-02 LAB — URINALYSIS, ROUTINE W REFLEX MICROSCOPIC
Bilirubin Urine: NEGATIVE
Glucose, UA: NEGATIVE mg/dL
Hgb urine dipstick: NEGATIVE
Ketones, ur: NEGATIVE mg/dL
Protein, ur: NEGATIVE mg/dL

## 2013-08-02 NOTE — MAU Note (Signed)
Pt states she has been having U/C's since 1300 yesterday every 20-25 minutes and have been painful.  Pt called dr office and was advised to come in but decided to go to bed instead.  Pt also c/o pelvic pressure.  Denies vaginal bleeding but states creamy whitish/brown discharge has increased x 2 days.  Good fetal movement.

## 2013-08-02 NOTE — Progress Notes (Signed)
Pt seen at MAU this morning. Pt in office to talk with Dr. Clearance Coots.

## 2013-08-06 ENCOUNTER — Inpatient Hospital Stay (HOSPITAL_COMMUNITY)
Admission: AD | Admit: 2013-08-06 | Discharge: 2013-08-06 | Disposition: A | Discharge status: Home / Self Care | Payer: Medicaid Other | Source: Ambulatory Visit | Attending: Obstetrics & Gynecology | Admitting: Obstetrics & Gynecology

## 2013-08-06 ENCOUNTER — Encounter (HOSPITAL_COMMUNITY): Payer: Self-pay | Admitting: *Deleted

## 2013-08-06 DIAGNOSIS — R109 Unspecified abdominal pain: Secondary | ICD-10-CM | POA: Insufficient documentation

## 2013-08-06 DIAGNOSIS — O4700 False labor before 37 completed weeks of gestation, unspecified trimester: Secondary | ICD-10-CM | POA: Insufficient documentation

## 2013-08-06 DIAGNOSIS — O479 False labor, unspecified: Secondary | ICD-10-CM

## 2013-08-06 DIAGNOSIS — O2390 Unspecified genitourinary tract infection in pregnancy, unspecified trimester: Secondary | ICD-10-CM | POA: Insufficient documentation

## 2013-08-06 DIAGNOSIS — N390 Urinary tract infection, site not specified: Secondary | ICD-10-CM | POA: Insufficient documentation

## 2013-08-06 LAB — URINALYSIS, ROUTINE W REFLEX MICROSCOPIC
Bilirubin Urine: NEGATIVE
Glucose, UA: NEGATIVE mg/dL
Ketones, ur: NEGATIVE mg/dL
pH: 6 (ref 5.0–8.0)

## 2013-08-06 LAB — URINE MICROSCOPIC-ADD ON

## 2013-08-06 MED ORDER — NITROFURANTOIN MONOHYD MACRO 100 MG PO CAPS: 100.0000 mg | ORAL_CAPSULE | Freq: Two times a day (BID) | ORAL | Status: DC

## 2013-08-06 NOTE — MAU Note (Signed)
Patient states she has been experiencing lower abdominal pain that starts on her sides and migrates around and down to her vaginal area since Thursday of last week. Patient denies vaginal bleeding but states she has had a discharge throughout the entire pregnancy and was told by her provider when she was seen in MAU last week that the discharge was a result of cervical thinning. States she was checked on her last visit for possible infections secondary to the discharge. States baby is normally very active but noticed decreased fetal movement today.

## 2013-08-06 NOTE — MAU Note (Signed)
Patient states she has had cramping all weekend. Has a slightly heavier mucus discharge. Denies bleeding or leaking. Baby has not moved as much as usual.

## 2013-08-06 NOTE — MAU Provider Note (Signed)
History     CSN: [id number]  Arrival date and time: 08/06/13 4098   First Provider Initiated Contact with Patient 08/06/13 1028      Chief Complaint  Patient presents with  . Labor Eval   HPI  [name] is a 27 y.o. female; [phone] at [redacted]w[redacted]d who presents to MAU for a labor evaluation. She began having abdominal cramps Friday and they continued over the weekend. The cramping is in the whole bottom part of her stomach, sides and radiates to her back. They have been coming and going. She called her Dr. Isidore Moos today and they told her to come to be checked. She had her cervix checked on 9/11 and she was 2 cm.  She has a history of 2 preterm delivery; one at 36 weeks and the other at 34 weeks. She reports good fetal movement, denies LOF, vaginal bleeding, vaginal itching/burning, urinary symptoms, h/a, dizziness, n/v, or fever/chills.    OB History   Grav Para Term Preterm Abortions TAB SAB Ect Mult Living   5 3 1 2 1  1   3       Past Medical History  Diagnosis Date  . Asthma   . Anemia   . Abnormal Pap smear   . Headache(784.0)   . Onset of menses     age of 64, regular, lasting 3 to 4 days, medium to heavy flow  . Gonorrhea   . Chlamydia   . Herpes     Last outbreak August 2014    Past Surgical History  Procedure Laterality Date  . Colposcopy    . Vaginal delivery      X3  . Wisdom tooth extraction      Family History  Problem Relation Age of Onset  . Arthritis    . Asthma    . Glaucoma    . Heart disease    . Hypertension    . Prostate cancer    . Cataracts    . COPD Father   . Prostate cancer Father   . Glaucoma Father   . Cataracts Father     History  Substance Use Topics  . Smoking status: Former Smoker    Types: Cigarettes    Quit date: 12/20/2012  . Smokeless tobacco: Never Used  . Alcohol Use: No    Allergies: No Known Allergies  Prescriptions prior to admission  Medication Sig Dispense Refill  . Prenatal Vit-Fe Fumarate-FA  (PRENATAL MULTIVITAMIN) TABS Take 1 tablet by mouth daily.        Results for orders placed during the hospital encounter of 08/06/13 (from the past 24 hour(s))  URINALYSIS, ROUTINE W REFLEX MICROSCOPIC     Status: Abnormal   Collection Time    08/06/13  9:20 AM      Result Value Range   Color, Urine YELLOW  YELLOW   APPearance CLEAR  CLEAR   Specific Gravity, Urine 1.025  1.005 - 1.030   pH 6.0  5.0 - 8.0   Glucose, UA NEGATIVE  NEGATIVE mg/dL   Hgb urine dipstick TRACE (*) NEGATIVE   Bilirubin Urine NEGATIVE  NEGATIVE   Ketones, ur NEGATIVE  NEGATIVE mg/dL   Protein, ur NEGATIVE  NEGATIVE mg/dL   Urobilinogen, UA 0.2  0.0 - 1.0 mg/dL   Nitrite POSITIVE (*) NEGATIVE   Leukocytes, UA NEGATIVE  NEGATIVE  URINE MICROSCOPIC-ADD ON     Status: Abnormal   Collection Time    08/06/13  9:20 AM  Result Value Range   Squamous Epithelial / LPF RARE  RARE   WBC, UA 0-2  <3 WBC/hpf   RBC / HPF 0-2  <3 RBC/hpf   Bacteria, UA MANY (*) RARE    Review of Systems  Constitutional: Negative for fever and chills.  Gastrointestinal: Positive for nausea, vomiting and abdominal pain. Negative for diarrhea and constipation.       Lower abdominal cramping   Genitourinary: Negative for dysuria, urgency and frequency.   Physical Exam   Blood pressure 103/55, pulse 65, temperature 98 F (36.7 C), temperature source Oral, resp. rate 16, height 5\' 4"  (1.626 m), weight 71.215 kg (157 lb), last menstrual period 11/28/2012, SpO2 100.00%, unknown if currently breastfeeding.  Physical Exam  Constitutional: She is oriented to person, place, and time. She appears well-developed. No distress.  Neck: Neck supple.  Respiratory: Effort normal.  GI: Soft.  Genitourinary: No vaginal discharge found.  Cervical exam: External os: 2 cm Internal os: 1 cm Thick, posterior, -3, ballotable, vertex Victorino Dike Lillis Nuttle FNP-C  Neurological: She is alert and oriented to person, place, and time.  Skin: Skin is warm. She  is not diaphoretic.  Psychiatric: Her behavior is normal.   Fetal Tracing:  Baseline: 140 bpm Variability: moderate  Accelerations: 15X15 Decelerations: none  Toco: 4-5 contractions in 1 hour.   MAU Course  Procedures  MDM UA Cervical exam Urine culture pending   Assessment and Plan  A: Preterm contractions  False labor  UTI   P:  Discharge home Return to MAU if contractions become stronger and closer together Labor precautions discussed with patient RX: Macrobid 100 mg PO BID times 5 days (#10) no rf  Keep your scheduled appointment with your Primary OBGYN Pregnancy support belt recommended   Pegeen Stiger IRENE FNP-C 08/06/2013, 11:10 AM

## 2013-08-09 ENCOUNTER — Encounter: Payer: Self-pay | Admitting: Obstetrics

## 2013-08-09 ENCOUNTER — Ambulatory Visit (INDEPENDENT_AMBULATORY_CARE_PROVIDER_SITE_OTHER): Payer: Medicaid Other | Admitting: Obstetrics

## 2013-08-09 VITALS — BP 116/73 | Temp 98.6°F | Wt 158.0 lb

## 2013-08-09 DIAGNOSIS — Z3480 Encounter for supervision of other normal pregnancy, unspecified trimester: Secondary | ICD-10-CM

## 2013-08-09 DIAGNOSIS — Z3483 Encounter for supervision of other normal pregnancy, third trimester: Secondary | ICD-10-CM

## 2013-08-09 LAB — POCT URINALYSIS DIPSTICK
Blood, UA: NEGATIVE
Glucose, UA: NEGATIVE
Nitrite, UA: NEGATIVE
Urobilinogen, UA: NEGATIVE

## 2013-08-09 NOTE — Progress Notes (Signed)
Pulse: 68

## 2013-08-16 ENCOUNTER — Ambulatory Visit (INDEPENDENT_AMBULATORY_CARE_PROVIDER_SITE_OTHER): Payer: Medicaid Other | Admitting: Obstetrics

## 2013-08-16 ENCOUNTER — Encounter: Payer: Self-pay | Admitting: Obstetrics

## 2013-08-16 ENCOUNTER — Encounter: Payer: Self-pay | Admitting: *Deleted

## 2013-08-16 VITALS — BP 119/79 | Temp 98.2°F | Wt 159.8 lb

## 2013-08-16 DIAGNOSIS — Z3480 Encounter for supervision of other normal pregnancy, unspecified trimester: Secondary | ICD-10-CM

## 2013-08-16 DIAGNOSIS — Z3483 Encounter for supervision of other normal pregnancy, third trimester: Secondary | ICD-10-CM

## 2013-08-16 LAB — POCT URINALYSIS DIPSTICK
Protein, UA: NEGATIVE
Spec Grav, UA: 1.02
Urobilinogen, UA: NEGATIVE
pH, UA: 6

## 2013-08-16 LAB — OB RESULTS CONSOLE GBS: GBS: NEGATIVE

## 2013-08-16 NOTE — Addendum Note (Signed)
Addended by: Glendell Docker on: 08/16/2013 01:53 PM   Modules accepted: Orders

## 2013-08-16 NOTE — Progress Notes (Signed)
Pulse- 55.  Patient states that she lost her mucous plug yesterday.

## 2013-08-19 ENCOUNTER — Inpatient Hospital Stay (HOSPITAL_COMMUNITY)
Admission: AD | Admit: 2013-08-19 | Discharge: 2013-08-19 | Disposition: A | Discharge status: Home / Self Care | Payer: Medicaid Other | Source: Ambulatory Visit | Attending: Obstetrics | Admitting: Obstetrics

## 2013-08-19 ENCOUNTER — Encounter (HOSPITAL_COMMUNITY): Payer: Self-pay | Admitting: *Deleted

## 2013-08-19 DIAGNOSIS — O26893 Other specified pregnancy related conditions, third trimester: Secondary | ICD-10-CM

## 2013-08-19 DIAGNOSIS — N898 Other specified noninflammatory disorders of vagina: Secondary | ICD-10-CM

## 2013-08-19 DIAGNOSIS — O2390 Unspecified genitourinary tract infection in pregnancy, unspecified trimester: Secondary | ICD-10-CM | POA: Insufficient documentation

## 2013-08-19 DIAGNOSIS — N390 Urinary tract infection, site not specified: Secondary | ICD-10-CM | POA: Insufficient documentation

## 2013-08-19 DIAGNOSIS — O26899 Other specified pregnancy related conditions, unspecified trimester: Secondary | ICD-10-CM

## 2013-08-19 DIAGNOSIS — N949 Unspecified condition associated with female genital organs and menstrual cycle: Secondary | ICD-10-CM | POA: Insufficient documentation

## 2013-08-19 DIAGNOSIS — O479 False labor, unspecified: Secondary | ICD-10-CM | POA: Insufficient documentation

## 2013-08-19 LAB — WET PREP, GENITAL
Clue Cells Wet Prep HPF POC: NONE SEEN
Trich, Wet Prep: NONE SEEN
Yeast Wet Prep HPF POC: NONE SEEN

## 2013-08-19 LAB — URINE MICROSCOPIC-ADD ON

## 2013-08-19 LAB — URINALYSIS, ROUTINE W REFLEX MICROSCOPIC
Bilirubin Urine: NEGATIVE
Hgb urine dipstick: NEGATIVE
Ketones, ur: NEGATIVE mg/dL
Nitrite: NEGATIVE
pH: 7.5 (ref 5.0–8.0)

## 2013-08-19 LAB — CULTURE, BETA STREP (GROUP B ONLY)

## 2013-08-19 MED ORDER — CEPHALEXIN 500 MG PO CAPS: 500.0000 mg | ORAL_CAPSULE | Freq: Three times a day (TID) | ORAL | Status: DC

## 2013-08-19 NOTE — MAU Provider Note (Signed)
CSN: [id number]     Arrival date & time 08/19/13  1303 History   None    Chief Complaint  Patient presents with  . Vaginal Discharge   (Consider location/radiation/quality/duration/timing/severity/associated sxs/prior Treatment) HPI Tammy Cole is a 27 y.o. [phone] at [redacted]w[redacted]d. She presents with c/o increased vaginal discharge x 3 d. Is clear and runny, no odor or itching. The amount of discharge increased today. She also has pressure in low abd and perineum x 2-3 d. No bleeding, baby is active, but at decreased amount. Past Medical History  Diagnosis Date  . Asthma   . Anemia   . Abnormal Pap smear   . Headache(784.0)   . Onset of menses     age of 36, regular, lasting 3 to 4 days, medium to heavy flow  . Gonorrhea   . Chlamydia   . Herpes     Last outbreak August 2014   Past Surgical History  Procedure Laterality Date  . Colposcopy    . Vaginal delivery      X3  . Wisdom tooth extraction     Family History  Problem Relation Age of Onset  . Arthritis    . Asthma    . Glaucoma    . Heart disease    . Hypertension    . Prostate cancer    . Cataracts    . COPD Father   . Prostate cancer Father   . Glaucoma Father   . Cataracts Father    History  Substance Use Topics  . Smoking status: Former Smoker    Types: Cigarettes    Quit date: 12/20/2012  . Smokeless tobacco: Never Used  . Alcohol Use: No   OB History   Grav Para Term Preterm Abortions TAB SAB Ect Mult Living   5 3 1 2 1  1   3      Review of Systems  Constitutional: Negative for fever and chills.  Gastrointestinal: Negative for nausea, vomiting, diarrhea and constipation.  Genitourinary: Positive for vaginal discharge. Negative for dysuria, urgency, frequency and vaginal bleeding.       Pressure pelvis, perineum    Allergies  Review of patient's allergies indicates no known allergies.  Home Medications  No current outpatient prescriptions on file. BP 96/70  Pulse 87  Temp(Src) 98.3 F (36.8  C) (Oral)  Resp 16  Ht 5\' 5"  (1.651 m)  Wt 153 lb (69.4 kg)  BMI 25.46 kg/m2  SpO2 100%  LMP 11/28/2012  Breastfeeding? Yes Physical Exam  Constitutional: She is oriented to person, place, and time. She appears well-developed and well-nourished.  Abdominal: Soft. There is no tenderness.  Genitourinary:  Pelvic exam: Ext genitalia- nl anatomy,skin intact Vagina- small amount thin cloudy discharge Cx-1cm internal os, long,posterior by RN  Musculoskeletal: Normal range of motion.  Neurological: She is alert and oriented to person, place, and time.  Skin: Skin is warm and dry.  Psychiatric: She has a normal mood and affect. Her behavior is normal.    ED Course  Procedures (including critical care time) Labs Review Labs Reviewed  URINALYSIS, ROUTINE W REFLEX MICROSCOPIC - Abnormal; Notable for the following:    Leukocytes, UA SMALL (*)    All other components within normal limits  URINE MICROSCOPIC-ADD ON - Abnormal; Notable for the following:    Squamous Epithelial / LPF FEW (*)    Bacteria, UA FEW (*)    All other components within normal limits  URINE CULTURE  WET PREP, GENITAL  Imaging Review No results found. Results for orders placed during the hospital encounter of 08/19/13 (from the past 24 hour(s))  URINALYSIS, ROUTINE W REFLEX MICROSCOPIC     Status: Abnormal   Collection Time    08/19/13  1:15 PM      Result Value Range   Color, Urine YELLOW  YELLOW   APPearance CLEAR  CLEAR   Specific Gravity, Urine 1.020  1.005 - 1.030   pH 7.5  5.0 - 8.0   Glucose, UA NEGATIVE  NEGATIVE mg/dL   Hgb urine dipstick NEGATIVE  NEGATIVE   Bilirubin Urine NEGATIVE  NEGATIVE   Ketones, ur NEGATIVE  NEGATIVE mg/dL   Protein, ur NEGATIVE  NEGATIVE mg/dL   Urobilinogen, UA 1.0  0.0 - 1.0 mg/dL   Nitrite NEGATIVE  NEGATIVE   Leukocytes, UA SMALL (*) NEGATIVE  URINE MICROSCOPIC-ADD ON     Status: Abnormal   Collection Time    08/19/13  1:15 PM      Result Value Range    Squamous Epithelial / LPF FEW (*) RARE   WBC, UA 7-10  <3 WBC/hpf   RBC / HPF 0-2  <3 RBC/hpf   Bacteria, UA FEW (*) RARE  WET PREP, GENITAL     Status: Abnormal   Collection Time    08/19/13  1:55 PM      Result Value Range   Yeast Wet Prep HPF POC NONE SEEN  NONE SEEN   Trich, Wet Prep NONE SEEN  NONE SEEN   Clue Cells Wet Prep HPF POC NONE SEEN  NONE SEEN   WBC, Wet Prep HPF POC MODERATE BACTERIA SEEN (*) NONE SEEN    MDM  No diagnosis found. ASSESSMENT:  37 5/7 wks Reactive strip, occ contractions, cervix 1 cm Fern negative, wet prep negative UTI, recently on Macrobid, will tx with Keflex C&S pending  PLAN: Keflex 500 tid x 7 Keep appt this week 10/2 for prenatal care Labor precautions reviewed

## 2013-08-19 NOTE — MAU Note (Signed)
Pt presents with complaints of vaginal discharge. States that she is having pelvic pressure that started last night and called MAU and was told she needed to come in and be evaluated last night but she says she laid down and went to sleep

## 2013-08-19 NOTE — MAU Note (Signed)
Patient presents with vaginal discharge x 3 days and pelvic pressure since last night.

## 2013-08-19 NOTE — Progress Notes (Signed)
Patient off EFM for discharge home.

## 2013-08-19 NOTE — MAU Note (Signed)
Specimen for wet prep and slide for fern test obtained by Eveline Keto, NP during speculum exam.

## 2013-08-20 ENCOUNTER — Encounter: Payer: Medicaid Other | Admitting: Obstetrics

## 2013-08-21 LAB — URINE CULTURE: Colony Count: 80000

## 2013-08-23 ENCOUNTER — Ambulatory Visit (INDEPENDENT_AMBULATORY_CARE_PROVIDER_SITE_OTHER): Payer: Medicaid Other | Admitting: Obstetrics

## 2013-08-23 ENCOUNTER — Encounter: Payer: Self-pay | Admitting: Obstetrics

## 2013-08-23 VITALS — BP 127/84 | Temp 98.3°F | Wt 157.8 lb

## 2013-08-23 DIAGNOSIS — Z3483 Encounter for supervision of other normal pregnancy, third trimester: Secondary | ICD-10-CM

## 2013-08-23 DIAGNOSIS — Z3480 Encounter for supervision of other normal pregnancy, unspecified trimester: Secondary | ICD-10-CM

## 2013-08-23 LAB — POCT URINALYSIS DIPSTICK
Bilirubin, UA: NEGATIVE
Ketones, UA: NEGATIVE
Leukocytes, UA: NEGATIVE
Nitrite, UA: NEGATIVE
Protein, UA: NEGATIVE

## 2013-08-23 NOTE — Progress Notes (Signed)
Pulse - 44/57

## 2013-08-25 ENCOUNTER — Encounter (HOSPITAL_COMMUNITY): Payer: Self-pay | Admitting: *Deleted

## 2013-08-25 ENCOUNTER — Inpatient Hospital Stay (HOSPITAL_COMMUNITY)
Admission: AD | Admit: 2013-08-25 | Discharge: 2013-08-26 | Disposition: A | Discharge status: Home / Self Care | Payer: Medicaid Other | Source: Ambulatory Visit | Attending: Obstetrics | Admitting: Obstetrics

## 2013-08-25 DIAGNOSIS — R109 Unspecified abdominal pain: Secondary | ICD-10-CM | POA: Insufficient documentation

## 2013-08-25 DIAGNOSIS — O479 False labor, unspecified: Secondary | ICD-10-CM | POA: Insufficient documentation

## 2013-08-25 HISTORY — DX: Anxiety disorder, unspecified: F41.9

## 2013-08-25 HISTORY — DX: Major depressive disorder, single episode, unspecified: F32.9

## 2013-08-25 HISTORY — DX: Bipolar disorder, unspecified: F31.9

## 2013-08-25 HISTORY — DX: Depression, unspecified: F32.A

## 2013-08-25 NOTE — MAU Note (Signed)
Patient complains of irregular cramping and contraction all day. Denies vaginal bleeding, gushes of fluid and discharge. Good fetal movement.

## 2013-08-27 ENCOUNTER — Inpatient Hospital Stay (HOSPITAL_COMMUNITY)
Admission: AD | Admit: 2013-08-27 | Discharge: 2013-08-29 | DRG: 774 | Disposition: A | Discharge status: Home / Self Care | Payer: Medicaid Other | Source: Ambulatory Visit | Attending: Obstetrics | Admitting: Obstetrics

## 2013-08-27 ENCOUNTER — Encounter (HOSPITAL_COMMUNITY): Payer: Self-pay

## 2013-08-27 ENCOUNTER — Inpatient Hospital Stay (HOSPITAL_COMMUNITY): Payer: Medicaid Other | Admitting: Anesthesiology

## 2013-08-27 ENCOUNTER — Encounter (HOSPITAL_COMMUNITY): Payer: Self-pay | Admitting: Anesthesiology

## 2013-08-27 DIAGNOSIS — O98519 Other viral diseases complicating pregnancy, unspecified trimester: Secondary | ICD-10-CM | POA: Diagnosis present

## 2013-08-27 DIAGNOSIS — F319 Bipolar disorder, unspecified: Secondary | ICD-10-CM | POA: Diagnosis present

## 2013-08-27 DIAGNOSIS — A6000 Herpesviral infection of urogenital system, unspecified: Secondary | ICD-10-CM | POA: Diagnosis present

## 2013-08-27 DIAGNOSIS — Z87891 Personal history of nicotine dependence: Secondary | ICD-10-CM

## 2013-08-27 DIAGNOSIS — O99344 Other mental disorders complicating childbirth: Secondary | ICD-10-CM | POA: Diagnosis present

## 2013-08-27 LAB — CBC
HCT: 32.5 % — ABNORMAL LOW (ref 36.0–46.0)
RDW: 13.1 % (ref 11.5–15.5)
WBC: 7.6 10*3/uL (ref 4.0–10.5)

## 2013-08-27 LAB — RPR: RPR Ser Ql: NONREACTIVE

## 2013-08-27 MED ORDER — OXYTOCIN BOLUS FROM INFUSION: 500.0000 mL | INTRAVENOUS | Status: DC

## 2013-08-27 MED ORDER — SENNOSIDES-DOCUSATE SODIUM 8.6-50 MG PO TABS
2.0000 | ORAL_TABLET | ORAL | Status: DC
  Administered 2013-08-28 – 2013-08-29 (×2): 2 via ORAL

## 2013-08-27 MED ORDER — MAGNESIUM HYDROXIDE 400 MG/5ML PO SUSP: 30.0000 mL | ORAL | Status: DC | PRN

## 2013-08-27 MED ORDER — ZOLPIDEM TARTRATE 5 MG PO TABS: 5.0000 mg | ORAL_TABLET | Freq: Every evening | ORAL | Status: DC | PRN

## 2013-08-27 MED ORDER — ACETAMINOPHEN 325 MG PO TABS: 650.0000 mg | ORAL_TABLET | ORAL | Status: DC | PRN

## 2013-08-27 MED ORDER — PHENYLEPHRINE 40 MCG/ML (10ML) SYRINGE FOR IV PUSH (FOR BLOOD PRESSURE SUPPORT)
80.0000 ug | PREFILLED_SYRINGE | INTRAVENOUS | Status: DC | PRN
  Filled 2013-08-27: qty 2
  Filled 2013-08-27: qty 5

## 2013-08-27 MED ORDER — TERBUTALINE SULFATE 1 MG/ML IJ SOLN: 0.2500 mg | Freq: Once | INTRAMUSCULAR | Status: DC | PRN

## 2013-08-27 MED ORDER — ONDANSETRON HCL 4 MG PO TABS: 4.0000 mg | ORAL_TABLET | ORAL | Status: DC | PRN

## 2013-08-27 MED ORDER — OXYCODONE-ACETAMINOPHEN 5-325 MG PO TABS: 1.0000 | ORAL_TABLET | ORAL | Status: DC | PRN

## 2013-08-27 MED ORDER — FLEET ENEMA 7-19 GM/118ML RE ENEM: 1.0000 | ENEMA | RECTAL | Status: DC | PRN

## 2013-08-27 MED ORDER — IBUPROFEN 600 MG PO TABS
600.0000 mg | ORAL_TABLET | Freq: Four times a day (QID) | ORAL | Status: DC
  Administered 2013-08-27 – 2013-08-29 (×9): 600 mg via ORAL
  Filled 2013-08-27 (×8): qty 1

## 2013-08-27 MED ORDER — LACTATED RINGERS IV SOLN
500.0000 mL | Freq: Once | INTRAVENOUS | Status: AC
  Administered 2013-08-27: 500 mL via INTRAVENOUS

## 2013-08-27 MED ORDER — ONDANSETRON HCL 4 MG/2ML IJ SOLN: 4.0000 mg | Freq: Four times a day (QID) | INTRAMUSCULAR | Status: DC | PRN

## 2013-08-27 MED ORDER — EPHEDRINE 5 MG/ML INJ
10.0000 mg | INTRAVENOUS | Status: DC | PRN
  Filled 2013-08-27: qty 4
  Filled 2013-08-27: qty 2

## 2013-08-27 MED ORDER — LACTATED RINGERS IV SOLN
INTRAVENOUS | Status: DC
  Administered 2013-08-27 (×2): via INTRAVENOUS

## 2013-08-27 MED ORDER — LACTATED RINGERS IV SOLN: 500.0000 mL | INTRAVENOUS | Status: DC | PRN

## 2013-08-27 MED ORDER — LIDOCAINE HCL (PF) 1 % IJ SOLN
30.0000 mL | INTRAMUSCULAR | Status: DC | PRN
  Administered 2013-08-27: 30 mL via SUBCUTANEOUS
  Filled 2013-08-27 (×2): qty 30

## 2013-08-27 MED ORDER — OXYTOCIN 40 UNITS IN LACTATED RINGERS INFUSION - SIMPLE MED: 1.0000 m[IU]/min | INTRAVENOUS | Status: DC

## 2013-08-27 MED ORDER — OXYTOCIN 40 UNITS IN LACTATED RINGERS INFUSION - SIMPLE MED
62.5000 mL/h | INTRAVENOUS | Status: DC
  Administered 2013-08-27: 62.5 mL/h via INTRAVENOUS
  Filled 2013-08-27: qty 1000

## 2013-08-27 MED ORDER — DIPHENHYDRAMINE HCL 25 MG PO CAPS: 25.0000 mg | ORAL_CAPSULE | Freq: Four times a day (QID) | ORAL | Status: DC | PRN

## 2013-08-27 MED ORDER — OXYCODONE-ACETAMINOPHEN 5-325 MG PO TABS
1.0000 | ORAL_TABLET | ORAL | Status: DC | PRN
  Administered 2013-08-27 (×2): 2 via ORAL
  Administered 2013-08-28: 1 via ORAL
  Administered 2013-08-28: 2 via ORAL
  Filled 2013-08-27: qty 1
  Filled 2013-08-27 (×2): qty 2
  Filled 2013-08-27: qty 1
  Filled 2013-08-27: qty 2

## 2013-08-27 MED ORDER — CITRIC ACID-SODIUM CITRATE 334-500 MG/5ML PO SOLN: 30.0000 mL | ORAL | Status: DC | PRN

## 2013-08-27 MED ORDER — ONDANSETRON HCL 4 MG/2ML IJ SOLN: 4.0000 mg | INTRAMUSCULAR | Status: DC | PRN

## 2013-08-27 MED ORDER — IBUPROFEN 600 MG PO TABS: 600.0000 mg | ORAL_TABLET | Freq: Four times a day (QID) | ORAL | Status: DC | PRN

## 2013-08-27 MED ORDER — BENZOCAINE-MENTHOL 20-0.5 % EX AERO
1.0000 "application " | INHALATION_SPRAY | CUTANEOUS | Status: DC | PRN
  Administered 2013-08-27: 1 via TOPICAL
  Filled 2013-08-27 (×3): qty 56

## 2013-08-27 MED ORDER — WITCH HAZEL-GLYCERIN EX PADS: 1.0000 "application " | MEDICATED_PAD | CUTANEOUS | Status: DC | PRN

## 2013-08-27 MED ORDER — DIBUCAINE 1 % RE OINT
1.0000 "application " | TOPICAL_OINTMENT | RECTAL | Status: DC | PRN
  Filled 2013-08-27: qty 28

## 2013-08-27 MED ORDER — INFLUENZA VAC SPLIT QUAD 0.5 ML IM SUSP
0.5000 mL | INTRAMUSCULAR | Status: AC
  Administered 2013-08-28: 0.5 mL via INTRAMUSCULAR
  Filled 2013-08-27: qty 0.5

## 2013-08-27 MED ORDER — LANOLIN HYDROUS EX OINT: TOPICAL_OINTMENT | CUTANEOUS | Status: DC | PRN

## 2013-08-27 MED ORDER — PNEUMOCOCCAL VAC POLYVALENT 25 MCG/0.5ML IJ INJ
0.5000 mL | INJECTION | INTRAMUSCULAR | Status: AC
  Administered 2013-08-28: 0.5 mL via INTRAMUSCULAR
  Filled 2013-08-27: qty 0.5

## 2013-08-27 MED ORDER — DIPHENHYDRAMINE HCL 50 MG/ML IJ SOLN: 12.5000 mg | INTRAMUSCULAR | Status: DC | PRN

## 2013-08-27 MED ORDER — FENTANYL 2.5 MCG/ML BUPIVACAINE 1/10 % EPIDURAL INFUSION (WH - ANES)
INTRAMUSCULAR | Status: DC | PRN
  Administered 2013-08-27: 14 mL/h via EPIDURAL

## 2013-08-27 MED ORDER — FERROUS SULFATE 325 (65 FE) MG PO TABS
325.0000 mg | ORAL_TABLET | Freq: Two times a day (BID) | ORAL | Status: DC
  Administered 2013-08-27 – 2013-08-29 (×4): 325 mg via ORAL
  Filled 2013-08-27 (×4): qty 1

## 2013-08-27 MED ORDER — FENTANYL 2.5 MCG/ML BUPIVACAINE 1/10 % EPIDURAL INFUSION (WH - ANES)
14.0000 mL/h | INTRAMUSCULAR | Status: DC | PRN
  Filled 2013-08-27: qty 125

## 2013-08-27 MED ORDER — PHENYLEPHRINE 40 MCG/ML (10ML) SYRINGE FOR IV PUSH (FOR BLOOD PRESSURE SUPPORT)
80.0000 ug | PREFILLED_SYRINGE | INTRAVENOUS | Status: DC | PRN
  Filled 2013-08-27: qty 2

## 2013-08-27 MED ORDER — LIDOCAINE HCL (PF) 1 % IJ SOLN
INTRAMUSCULAR | Status: DC | PRN
  Administered 2013-08-27 (×3): 5 mL

## 2013-08-27 MED ORDER — EPHEDRINE 5 MG/ML INJ
10.0000 mg | INTRAVENOUS | Status: DC | PRN
  Filled 2013-08-27: qty 2

## 2013-08-27 MED ORDER — MEASLES, MUMPS & RUBELLA VAC ~~LOC~~ INJ
0.5000 mL | INJECTION | Freq: Once | SUBCUTANEOUS | Status: DC
  Filled 2013-08-27: qty 0.5

## 2013-08-27 MED ORDER — PRENATAL MULTIVITAMIN CH
1.0000 | ORAL_TABLET | Freq: Every day | ORAL | Status: DC
  Administered 2013-08-27 – 2013-08-29 (×3): 1 via ORAL
  Filled 2013-08-27 (×2): qty 1

## 2013-08-27 MED ORDER — TETANUS-DIPHTH-ACELL PERTUSSIS 5-2.5-18.5 LF-MCG/0.5 IM SUSP
0.5000 mL | Freq: Once | INTRAMUSCULAR | Status: AC
  Administered 2013-08-28: 0.5 mL via INTRAMUSCULAR
  Filled 2013-08-27: qty 0.5

## 2013-08-27 NOTE — Anesthesia Preprocedure Evaluation (Signed)
Anesthesia Evaluation  Patient identified by MRN, date of birth, ID band Patient awake    Reviewed: Allergy & Precautions, H&P , NPO status , Patient's Chart, lab work & pertinent test results  History of Anesthesia Complications Negative for: history of anesthetic complications  Airway Mallampati: II TM Distance: >3 FB Neck ROM: full    Dental no notable dental hx. (+) Teeth Intact   Pulmonary neg pulmonary ROS, asthma ,  breath sounds clear to auscultation  Pulmonary exam normal       Cardiovascular negative cardio ROS  Rhythm:regular Rate:Normal     Neuro/Psych negative neurological ROS  negative psych ROS   GI/Hepatic negative GI ROS, Neg liver ROS,   Endo/Other  negative endocrine ROS  Renal/GU negative Renal ROS  negative genitourinary   Musculoskeletal   Abdominal Normal abdominal exam  (+)   Peds  Hematology negative hematology ROS (+)   Anesthesia Other Findings   Reproductive/Obstetrics (+) Pregnancy                           Anesthesia Physical Anesthesia Plan  ASA: II  Anesthesia Plan: Epidural   Post-op Pain Management:    Induction:   Airway Management Planned:   Additional Equipment:   Intra-op Plan:   Post-operative Plan:   Informed Consent: I have reviewed the patients History and Physical, chart, labs and discussed the procedure including the risks, benefits and alternatives for the proposed anesthesia with the patient or authorized representative who has indicated his/her understanding and acceptance.     Plan Discussed with:   Anesthesia Plan Comments:         Anesthesia Quick Evaluation  

## 2013-08-27 NOTE — Progress Notes (Signed)
Tammy Cole is a 27 y.o. [phone] at [redacted]w[redacted]d by LMP admitted for active labor  Subjective:   Objective: BP 116/77  Pulse 62  Temp(Src) 98.1 F (36.7 C) (Oral)  Resp 18  Ht 5\' 5"  (1.651 m)  Wt 71.668 kg (158 lb)  BMI 26.29 kg/m2  SpO2 98%  LMP 11/28/2012 I/O last 3 completed shifts: In: -  Out: 150 [Urine:150]    FHT:  FHR: 140 bpm, variability: moderate,  accelerations:  Present,  decelerations:  Absent UC:   irregular, every 6 minutes SVE:   Dilation: 8 Effacement (%): 100 Station: -1 Exam by:: Dr. Tamela Oddi  Labs: Lab Results  Component Value Date   WBC 7.6 08/27/2013   HGB 11.5* 08/27/2013   HCT 32.5* 08/27/2013   MCV 89.5 08/27/2013   PLT 135* 08/27/2013    Assessment / Plan: Active labor  Labor: See above; start low dose Pitocin Preeclampsia:  n/a Fetal Wellbeing:  Category I Pain Control:  Epidural I/D:  n/a Anticipated MOD:  NSVD  JACKSON-MOORE,Ibrahem Volkman A 08/27/2013, 8:55 AM

## 2013-08-27 NOTE — H&P (Signed)
Tammy Cole is a 27 y.o. female presenting for UC's. Maternal Medical History:  Reason for admission: Contractions.  27 yo G1213.  EDC 09-04-13.  Presents with UC's.  Contractions: Onset was 6-12 hours ago.    Fetal activity: Perceived fetal activity is normal.   Last perceived fetal movement was within the past hour.    Prenatal complications: no prenatal complications Prenatal Complications - Diabetes: none.    OB History   Grav Para Term Preterm Abortions TAB SAB Ect Mult Living   5 3 1 2 1  1   3      Past Medical History  Diagnosis Date  . Asthma   . Anemia   . Abnormal Pap smear   . Headache(784.0)   . Onset of menses     age of 80, regular, lasting 3 to 4 days, medium to heavy flow  . Gonorrhea   . Chlamydia   . Herpes     Last outbreak August 2014  . Anxiety   . Depression   . Bipolar 1 disorder    Past Surgical History  Procedure Laterality Date  . Colposcopy    . Vaginal delivery      X3  . Wisdom tooth extraction     Family History: family history includes Arthritis in an other family member; Asthma in an other family member; COPD in her father; Cataracts in her father and another family member; Glaucoma in her father and another family member; Heart disease in an other family member; Hypertension in an other family member; Prostate cancer in her father and another family member. Social History:  reports that she quit smoking about 8 months ago. Her smoking use included Cigarettes. She smoked 0.00 packs per day. She has never used smokeless tobacco. She reports that she does not drink alcohol or use illicit drugs.   Prenatal Transfer Tool  Maternal Diabetes: No Genetic Screening: Normal Maternal Ultrasounds/Referrals: Normal Fetal Ultrasounds or other Referrals:  None Maternal Substance Abuse:  No Significant Maternal Medications:  Meds include: Other:   Valtrex Significant Maternal Lab Results:  None Other Comments:  H/O Genital Herpes.    Review  of Systems  All other systems reviewed and are negative.    Dilation: 4.5 Effacement (%): 50 Station: -3 Exam by:: Judeth Horn RNC Blood pressure 115/77, pulse 72, temperature 98.5 F (36.9 C), temperature source Oral, resp. rate 18, height 5\' 5"  (1.651 m), weight 158 lb (71.668 kg), last menstrual period 11/28/2012. Maternal Exam:  Uterine Assessment: Contraction strength is moderate.  Abdomen: Patient reports no abdominal tenderness. Fetal presentation: vertex  Pelvis: adequate for delivery.   Cervix: Cervix evaluated by digital exam.     Physical Exam  Nursing note and vitals reviewed. Constitutional: She is oriented to person, place, and time. She appears well-developed and well-nourished.  HENT:  Head: Normocephalic and atraumatic.  Eyes: Conjunctivae are normal. Pupils are equal, round, and reactive to light.  Neck: Normal range of motion. Neck supple.  Cardiovascular: Normal rate and regular rhythm.   Respiratory: Effort normal and breath sounds normal.  GI: Soft.  Genitourinary: Vagina normal and uterus normal.  Musculoskeletal: Normal range of motion.  Neurological: She is alert and oriented to person, place, and time.  Skin: Skin is warm and dry.  Psychiatric: She has a normal mood and affect. Her behavior is normal. Judgment and thought content normal.    Prenatal labs: ABO, Rh: B/POS/-- (03/20 1427) Antibody: NEG (03/20 1427) Rubella: 3.60 (03/20 1427) RPR: NON  REAC (07/02 1143)  HBsAg: NEGATIVE (03/20 1427)  HIV: NON REACTIVE (07/02 1143)  GBS:     Assessment/Plan: 38.5 weeks.  Early labor.  Admit.   Orma Cheetham A 08/27/2013, 4:15 AM

## 2013-08-27 NOTE — Anesthesia Procedure Notes (Signed)
Epidural Patient location during procedure: OB  Staffing Anesthesiologist: Dilynn Munroe Performed by: anesthesiologist   Preanesthetic Checklist Completed: patient identified, site marked, surgical consent, pre-op evaluation, timeout performed, IV checked, risks and benefits discussed and monitors and equipment checked  Epidural Patient position: sitting Prep: ChloraPrep Patient monitoring: heart rate, continuous pulse ox and blood pressure Approach: right paramedian Injection technique: LOR saline  Needle:  Needle type: Tuohy  Needle gauge: 17 G Needle length: 9 cm and 9 Needle insertion depth: 6 cm Catheter type: closed end flexible Catheter size: 20 Guage Catheter at skin depth: 10 cm Test dose: negative  Assessment Events: blood not aspirated, injection not painful, no injection resistance, negative IV test and no paresthesia  Additional Notes   Patient tolerated the insertion well without complications.   

## 2013-08-27 NOTE — MAU Note (Signed)
Contractions every 5 minutes since 10pm tonight. Denies LOF or VB. Positive fetal movement.
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
## 2013-08-28 NOTE — Anesthesia Postprocedure Evaluation (Signed)
  Anesthesia Post-op Note  Anesthesia Post Note  Patient: Tammy Cole  Procedure(s) Performed: * No procedures listed *  Anesthesia type: Epidural  Patient location: Mother/Baby  Post pain: Pain level controlled  Post assessment: Post-op Vital signs reviewed  Last Vitals:  Filed Vitals:   08/28/13 0500  BP: 111/72  Pulse: 50  Temp: 36.4 C  Resp: 18    Post vital signs: Reviewed  Level of consciousness:alert  Complications: No apparent anesthesia complications

## 2013-08-28 NOTE — Progress Notes (Signed)
Post Partum Day 1 Subjective: no complaints  Objective: Blood pressure 111/72, pulse 50, temperature 97.6 F (36.4 C), temperature source Oral, resp. rate 18, height 5\' 5"  (1.651 m), weight 158 lb (71.668 kg), last menstrual period 11/28/2012, SpO2 98.00%, unknown if currently breastfeeding.  Physical Exam:  General: alert and no distress Lochia: appropriate Uterine Fundus: firm Incision: healing well DVT Evaluation: No evidence of DVT seen on physical exam.   Recent Labs  08/27/13 0335  HGB 11.5*  HCT 32.5*    Assessment/Plan: Plan for discharge tomorrow   LOS: 1 day   HARPER,CHARLES A 08/28/2013, 6:32 AM

## 2013-08-28 NOTE — Progress Notes (Signed)
UR chart review completed.  

## 2013-08-29 MED ORDER — IBUPROFEN 600 MG PO TABS: 600.0000 mg | ORAL_TABLET | Freq: Four times a day (QID) | ORAL | Status: DC | PRN

## 2013-08-29 MED ORDER — OXYCODONE-ACETAMINOPHEN 5-325 MG PO TABS: 1.0000 | ORAL_TABLET | ORAL | Status: DC | PRN

## 2013-08-29 NOTE — Discharge Summary (Signed)
Obstetric Discharge Summary Reason for Admission: onset of labor Prenatal Procedures: ultrasound Intrapartum Procedures: spontaneous vaginal delivery Postpartum Procedures: none Complications-Operative and Postpartum: none Hemoglobin  Date Value Range Status  08/27/2013 11.5* 12.0 - 15.0 g/dL Final     HCT  Date Value Range Status  08/27/2013 32.5* 36.0 - 46.0 % Final    Physical Exam:  General: alert and no distress Lochia: appropriate Uterine Fundus: firm Incision: healing well DVT Evaluation: No evidence of DVT seen on physical exam.  Discharge Diagnoses: Term Pregnancy-delivered  Discharge Information: Date: 08/29/2013 Activity: pelvic rest Diet: routine Medications: PNV, Ibuprofen, Colace and Percocet Condition: stable Instructions: refer to practice specific booklet Discharge to: home Follow-up Information   Follow up with Becki Mccaskill A, MD. Schedule an appointment as soon as possible for a visit in 2 weeks.   Specialty:  Obstetrics and Gynecology   Contact information:   [address] [place] [address] [phone]       Newborn Data: Live born female  Birth Weight: 6 lb 4.2 oz (2841 g) APGAR: 9, 9  Home with mother.  Catheryn Slifer A 08/29/2013, 6:24 AM

## 2013-08-29 NOTE — Progress Notes (Signed)
Post Partum Day 2 Subjective: no complaints  Objective: Blood pressure 126/76, pulse 48, temperature 98.8 F (37.1 C), temperature source Oral, resp. rate 18, height 5\' 5"  (1.651 m), weight 158 lb (71.668 kg), last menstrual period 11/28/2012, SpO2 98.00%, unknown if currently breastfeeding.  Physical Exam:  General: alert and no distress Lochia: appropriate Uterine Fundus: firm Incision: healing well DVT Evaluation: No evidence of DVT seen on physical exam.   Recent Labs  08/27/13 0335  HGB 11.5*  HCT 32.5*    Assessment/Plan: Discharge home   LOS: 2 days   HARPER,CHARLES A 08/29/2013, 6:20 AM

## 2013-09-02 ENCOUNTER — Inpatient Hospital Stay (HOSPITAL_COMMUNITY)
Admission: AD | Admit: 2013-09-02 | Discharge: 2013-09-06 | DRG: 776 | Disposition: A | Discharge status: Home / Self Care | Payer: Medicaid Other | Source: Ambulatory Visit | Attending: Obstetrics | Admitting: Obstetrics

## 2013-09-02 ENCOUNTER — Encounter (HOSPITAL_COMMUNITY): Payer: Self-pay | Admitting: *Deleted

## 2013-09-02 DIAGNOSIS — IMO0002 Reserved for concepts with insufficient information to code with codable children: Principal | ICD-10-CM

## 2013-09-02 DIAGNOSIS — O9279 Other disorders of lactation: Secondary | ICD-10-CM | POA: Diagnosis present

## 2013-09-02 DIAGNOSIS — O139 Gestational [pregnancy-induced] hypertension without significant proteinuria, unspecified trimester: Secondary | ICD-10-CM

## 2013-09-02 LAB — LACTATE DEHYDROGENASE: LDH: 421 U/L — ABNORMAL HIGH (ref 94–250)

## 2013-09-02 LAB — COMPREHENSIVE METABOLIC PANEL
Albumin: 2.7 g/dL — ABNORMAL LOW (ref 3.5–5.2)
Alkaline Phosphatase: 157 U/L — ABNORMAL HIGH (ref 39–117)
BUN: 13 mg/dL (ref 6–23)
Creatinine, Ser: 0.77 mg/dL (ref 0.50–1.10)
Potassium: 3.9 mEq/L (ref 3.5–5.1)
Total Protein: 6 g/dL (ref 6.0–8.3)

## 2013-09-02 LAB — CBC
HCT: 33.1 % — ABNORMAL LOW (ref 36.0–46.0)
MCHC: 35 g/dL (ref 30.0–36.0)
MCV: 89 fL (ref 78.0–100.0)
RDW: 12.9 % (ref 11.5–15.5)

## 2013-09-02 LAB — URINALYSIS, ROUTINE W REFLEX MICROSCOPIC
Glucose, UA: NEGATIVE mg/dL
Ketones, ur: NEGATIVE mg/dL
Nitrite: NEGATIVE
Protein, ur: NEGATIVE mg/dL
Urobilinogen, UA: 0.2 mg/dL (ref 0.0–1.0)
pH: 7 (ref 5.0–8.0)

## 2013-09-02 LAB — URINE MICROSCOPIC-ADD ON

## 2013-09-02 LAB — MRSA PCR SCREENING: MRSA by PCR: NEGATIVE

## 2013-09-02 LAB — URIC ACID: Uric Acid, Serum: 5.9 mg/dL (ref 2.4–7.0)

## 2013-09-02 MED ORDER — DIPHENHYDRAMINE HCL 50 MG/ML IJ SOLN: 12.5000 mg | Freq: Once | INTRAMUSCULAR | Status: DC

## 2013-09-02 MED ORDER — MAGNESIUM SULFATE 40 G IN LACTATED RINGERS - SIMPLE
2.0000 g/h | INTRAVENOUS | Status: DC
  Administered 2013-09-02 – 2013-09-03 (×2): 2 g/h via INTRAVENOUS
  Filled 2013-09-02 (×2): qty 500

## 2013-09-02 MED ORDER — HYDRALAZINE HCL 20 MG/ML IJ SOLN
10.0000 mg | Freq: Once | INTRAMUSCULAR | Status: AC
  Administered 2013-09-02: 10 mg via INTRAVENOUS
  Filled 2013-09-02: qty 1

## 2013-09-02 MED ORDER — LACTATED RINGERS IV SOLN
INTRAVENOUS | Status: DC
  Administered 2013-09-02 – 2013-09-03 (×6): via INTRAVENOUS

## 2013-09-02 MED ORDER — PROMETHAZINE HCL 25 MG PO TABS
25.0000 mg | ORAL_TABLET | Freq: Once | ORAL | Status: AC
  Administered 2013-09-02: 25 mg via ORAL
  Filled 2013-09-02: qty 1

## 2013-09-02 MED ORDER — BUTALBITAL-APAP-CAFFEINE 50-325-40 MG PO TABS
1.0000 | ORAL_TABLET | ORAL | Status: DC | PRN
  Administered 2013-09-02 – 2013-09-04 (×4): 1 via ORAL
  Filled 2013-09-02 (×4): qty 1

## 2013-09-02 MED ORDER — MAGNESIUM SULFATE BOLUS VIA INFUSION
4.0000 g | Freq: Once | INTRAVENOUS | Status: AC
  Administered 2013-09-02: 4 g via INTRAVENOUS
  Filled 2013-09-02: qty 500

## 2013-09-02 MED ORDER — LABETALOL HCL 200 MG PO TABS
200.0000 mg | ORAL_TABLET | Freq: Three times a day (TID) | ORAL | Status: DC
  Administered 2013-09-02 – 2013-09-04 (×7): 200 mg via ORAL
  Filled 2013-09-02 (×7): qty 1
  Filled 2013-09-02: qty 2
  Filled 2013-09-02 (×2): qty 1

## 2013-09-02 MED ORDER — DEXAMETHASONE SODIUM PHOSPHATE 10 MG/ML IJ SOLN: 10.0000 mg | Freq: Once | INTRAMUSCULAR | Status: DC

## 2013-09-02 MED ORDER — METOCLOPRAMIDE HCL 5 MG/ML IJ SOLN: 10.0000 mg | Freq: Once | INTRAMUSCULAR | Status: DC

## 2013-09-02 MED ORDER — ACETAMINOPHEN 500 MG PO TABS
1000.0000 mg | ORAL_TABLET | Freq: Once | ORAL | Status: AC
  Administered 2013-09-02: 1000 mg via ORAL
  Filled 2013-09-02: qty 2

## 2013-09-02 NOTE — H&P (Signed)
This is Dr. Francoise Ceo dictating the history and physical on  Tammy Cole she's a 27 year old gravida 5 para [id number] delivered at 38 weeks a week ago negative GBS and came in today with a severe had headache and blood pressure 190 0 Monocryl 5 PIH labs were done and she was started on magnesium sulfate 4 g loading and 2 g an hour and admitted to the ICU Past medical history as above Past surgical history negative Social history negative System review negative Physical exam well-developed female in no distress HEENT negative Lungs clear to P&A Heart regular with no murmurs or gallops Breasts engorged Abdomen uterus 20 week size Pelvic deferred Extremities negative

## 2013-09-02 NOTE — MAU Provider Note (Signed)
History     CSN: [id number]  Arrival date and time: 09/02/13 7829   None     Chief Complaint  Patient presents with  . Headache   HPI  [name] is a 27 y.o. female [phone] post partum, who delivered on 10/6, status post vaginal delivery without complications. Dr. Tamela Oddi delivered her baby. She presents today with severe headache; she currently rates her pain 9/10. She has tried Ibuprofen, and Excedrin without any relief. The headache does not change intensity with changes in movement, the HA is not worse when she is sitting up, she feels the HA feels better when she is sitting up. She denies history of preeclampsia or history of  blood pressure problems.   OB History   Grav Para Term Preterm Abortions TAB SAB Ect Mult Living   5 4 2 2 1  1   4       Past Medical History  Diagnosis Date  . Asthma   . Anemia   . Abnormal Pap smear   . Headache(784.0)   . Onset of menses     age of 51, regular, lasting 3 to 4 days, medium to heavy flow  . Gonorrhea   . Chlamydia   . Herpes     Last outbreak August 2014  . Anxiety   . Depression   . Bipolar 1 disorder     Past Surgical History  Procedure Laterality Date  . Colposcopy    . Vaginal delivery      X3  . Wisdom tooth extraction      Family History  Problem Relation Age of Onset  . Arthritis    . Asthma    . Glaucoma    . Heart disease    . Hypertension    . Prostate cancer    . Cataracts    . COPD Father   . Prostate cancer Father   . Glaucoma Father   . Cataracts Father     History  Substance Use Topics  . Smoking status: Former Smoker    Types: Cigarettes    Quit date: 12/20/2012  . Smokeless tobacco: Never Used  . Alcohol Use: No    Allergies: No Known Allergies  Prescriptions prior to admission  Medication Sig Dispense Refill  . cephALEXin (KEFLEX) 500 MG capsule Take 1 capsule (500 mg total) by mouth 3 (three) times daily.  21 capsule  0  . ibuprofen (ADVIL,MOTRIN) 600 MG  tablet Take 1 tablet (600 mg total) by mouth every 6 (six) hours as needed for pain.  30 tablet  5  . oxyCODONE-acetaminophen (PERCOCET/ROXICET) 5-325 MG per tablet Take 1-2 tablets by mouth every 4 (four) hours as needed for pain.  40 tablet  0  . Prenatal Vit-Fe Fumarate-FA (PRENATAL MULTIVITAMIN) TABS Take 1 tablet by mouth daily.       . valACYclovir (VALTREX) 500 MG tablet Take 500 mg by mouth daily.       Results for orders placed during the hospital encounter of 09/02/13 (from the past 24 hour(s))  URINALYSIS, ROUTINE W REFLEX MICROSCOPIC     Status: Abnormal   Collection Time    09/02/13  8:33 AM      Result Value Range   Color, Urine YELLOW  YELLOW   APPearance CLEAR  CLEAR   Specific Gravity, Urine 1.015  1.005 - 1.030   pH 7.0  5.0 - 8.0   Glucose, UA NEGATIVE  NEGATIVE mg/dL   Hgb urine dipstick MODERATE (*)  NEGATIVE   Bilirubin Urine NEGATIVE  NEGATIVE   Ketones, ur NEGATIVE  NEGATIVE mg/dL   Protein, ur NEGATIVE  NEGATIVE mg/dL   Urobilinogen, UA 0.2  0.0 - 1.0 mg/dL   Nitrite NEGATIVE  NEGATIVE   Leukocytes, UA SMALL (*) NEGATIVE  URINE MICROSCOPIC-ADD ON     Status: Abnormal   Collection Time    09/02/13  8:33 AM      Result Value Range   Squamous Epithelial / LPF FEW (*) RARE   WBC, UA 11-20  <3 WBC/hpf   RBC / HPF 11-20  <3 RBC/hpf   Bacteria, UA FEW (*) RARE  CBC     Status: Abnormal   Collection Time    09/02/13  9:03 AM      Result Value Range   WBC 9.6  4.0 - 10.5 K/uL   RBC 3.72 (*) 3.87 - 5.11 MIL/uL   Hemoglobin 11.6 (*) 12.0 - 15.0 g/dL   HCT 16.1 (*) 09.6 - 04.5 %   MCV 89.0  78.0 - 100.0 fL   MCH 31.2  26.0 - 34.0 pg   MCHC 35.0  30.0 - 36.0 g/dL   RDW 40.9  81.1 - 91.4 %   Platelets 132 (*) 150 - 400 K/uL  COMPREHENSIVE METABOLIC PANEL     Status: Abnormal   Collection Time    09/02/13  9:03 AM      Result Value Range   Sodium 141  135 - 145 mEq/L   Potassium 3.9  3.5 - 5.1 mEq/L   Chloride 108  96 - 112 mEq/L   CO2 23  19 - 32 mEq/L    Glucose, Bld 95  70 - 99 mg/dL   BUN 13  6 - 23 mg/dL   Creatinine, Ser 7.82  0.50 - 1.10 mg/dL   Calcium 8.8  8.4 - 95.6 mg/dL   Total Protein 6.0  6.0 - 8.3 g/dL   Albumin 2.7 (*) 3.5 - 5.2 g/dL   AST 25  0 - 37 U/L   ALT 24  0 - 35 U/L   Alkaline Phosphatase 157 (*) 39 - 117 U/L   Total Bilirubin 0.4  0.3 - 1.2 mg/dL   GFR calc non Af Amer >90  >90 mL/min   GFR calc Af Amer >90  >90 mL/min  URIC ACID     Status: None   Collection Time    09/02/13  9:03 AM      Result Value Range   Uric Acid, Serum 5.9  2.4 - 7.0 mg/dL  LACTATE DEHYDROGENASE     Status: Abnormal   Collection Time    09/02/13  9:03 AM      Result Value Range   LDH 421 (*) 94 - 250 U/L    Review of Systems  Constitutional: Positive for chills and diaphoresis. Negative for fever.  Eyes: Positive for photophobia. Negative for blurred vision.  Gastrointestinal: Negative for nausea and vomiting.  Genitourinary: Negative for dysuria, urgency, frequency and hematuria.       No vaginal discharge. Minimal vaginal bleeding. No dysuria.   Neurological: Positive for headaches. Negative for dizziness and seizures.   Physical Exam   Blood pressure 170/98, pulse 66, temperature 98.5 F (36.9 C), temperature source Oral, resp. rate 18, last menstrual period 11/28/2012, unknown if currently breastfeeding.  Physical Exam  Constitutional: She is oriented to person, place, and time. She appears well-developed and well-nourished. No distress.  HENT:  Head: Normocephalic.  Eyes: Pupils are  equal, round, and reactive to light.  Neck: Neck supple.  Cardiovascular: Normal rate.   Respiratory: Effort normal.  GI: Soft.  Neurological: She is alert and oriented to person, place, and time. She has normal reflexes.  Skin: Skin is warm and dry. She is not diaphoretic.  Psychiatric: Her behavior is normal.    MAU Course  Procedures None  MDM CBC CMET LDH Urine acid Hydrazine 10 mg IV    Tylenol 1 gram Phenergan 25  mg PO; patient does not have anyone currently who can come to MAU to pick up her newborn baby. She is here by herself with her baby. I explained that there were limited medications I could give her if she was planning to drive home and if her baby would remain with her without someone to assist her.  Consulted with Dr. Gaynell Face; admit patient to ICU  Pt rates her pain 6/10 following hydralazine    Assessment and Plan  A: Headache Elevated blood pressure  Post partum   P: Admit to [facility] ICU per Dr. Georgia Lopes, Tammy Cole Tammy Cole 09/02/2013, 10:42 AM

## 2013-09-02 NOTE — MAU Note (Signed)
Pt presents with complaints of having a severe headache on and off since she delivered October the 6th. Pt states that she did have an epidural for her delivery. She states that her neck even hurts when she gets a headache.

## 2013-09-03 LAB — CBC
HCT: 31 % — ABNORMAL LOW (ref 36.0–46.0)
Platelets: 140 10*3/uL — ABNORMAL LOW (ref 150–400)
RDW: 13.2 % (ref 11.5–15.5)
WBC: 8.6 10*3/uL (ref 4.0–10.5)

## 2013-09-03 LAB — COMPREHENSIVE METABOLIC PANEL
AST: 18 U/L (ref 0–37)
Albumin: 2.2 g/dL — ABNORMAL LOW (ref 3.5–5.2)
Alkaline Phosphatase: 138 U/L — ABNORMAL HIGH (ref 39–117)
BUN: 11 mg/dL (ref 6–23)
CO2: 24 mEq/L (ref 19–32)
Chloride: 107 mEq/L (ref 96–112)
Creatinine, Ser: 0.87 mg/dL (ref 0.50–1.10)
Potassium: 3.7 mEq/L (ref 3.5–5.1)
Total Bilirubin: 0.2 mg/dL — ABNORMAL LOW (ref 0.3–1.2)
Total Protein: 5.3 g/dL — ABNORMAL LOW (ref 6.0–8.3)

## 2013-09-03 LAB — URIC ACID: Uric Acid, Serum: 6 mg/dL (ref 2.4–7.0)

## 2013-09-03 MED ORDER — CYCLOBENZAPRINE HCL 10 MG PO TABS
10.0000 mg | ORAL_TABLET | Freq: Three times a day (TID) | ORAL | Status: DC | PRN
  Administered 2013-09-03 (×2): 10 mg via ORAL
  Filled 2013-09-03: qty 1

## 2013-09-03 MED ORDER — IBUPROFEN 800 MG PO TABS
800.0000 mg | ORAL_TABLET | Freq: Three times a day (TID) | ORAL | Status: DC
  Administered 2013-09-03 – 2013-09-06 (×9): 800 mg via ORAL
  Filled 2013-09-03 (×9): qty 1

## 2013-09-03 MED ORDER — OXYCODONE-ACETAMINOPHEN 5-325 MG PO TABS
1.0000 | ORAL_TABLET | ORAL | Status: DC | PRN
  Administered 2013-09-03: 1 via ORAL
  Administered 2013-09-04: 2 via ORAL
  Administered 2013-09-05: 1 via ORAL
  Administered 2013-09-05: 2 via ORAL
  Administered 2013-09-06: 1 via ORAL
  Filled 2013-09-03 (×2): qty 1
  Filled 2013-09-03: qty 2
  Filled 2013-09-03: qty 1
  Filled 2013-09-03: qty 2

## 2013-09-03 NOTE — Progress Notes (Signed)
Post Partum Day 7 Subjective: no complaints  Objective: Blood pressure 148/102, pulse 90, temperature 97.8 F (36.6 C), temperature source Oral, resp. rate 16, height 5\' 5"  (1.651 m), weight 149 lb (67.586 kg), last menstrual period 11/28/2012, SpO2 100.00%, unknown if currently breastfeeding.  Physical Exam:  General: alert and no distress Lochia: appropriate Uterine Fundus: firm Incision: healing well DVT Evaluation: No evidence of DVT seen on physical exam.   Recent Labs  09/02/13 0903 09/03/13 0503  HGB 11.6* 10.9*  HCT 33.1* 31.0*    Assessment/Plan: Postpartum preeclampsia.  Stable.  Continue magnesium sulfate.  May need to increase Labetalol.   LOS: 1 day   Tammy Cole 09/03/2013, 7:43 AM

## 2013-09-04 ENCOUNTER — Encounter (HOSPITAL_COMMUNITY): Payer: Self-pay | Admitting: *Deleted

## 2013-09-04 LAB — URINE CULTURE: Colony Count: >=100000

## 2013-09-04 MED ORDER — LABETALOL HCL 300 MG PO TABS
300.0000 mg | ORAL_TABLET | Freq: Three times a day (TID) | ORAL | Status: DC
  Administered 2013-09-04 – 2013-09-06 (×5): 300 mg via ORAL
  Filled 2013-09-04 (×6): qty 1

## 2013-09-04 MED ORDER — SODIUM CHLORIDE 0.9 % IV SOLN
INTRAVENOUS | Status: DC
  Administered 2013-09-04: 10:00:00 via INTRAVENOUS

## 2013-09-04 MED ORDER — LABETALOL HCL 5 MG/ML IV SOLN
40.0000 mg | Freq: Once | INTRAVENOUS | Status: AC
  Administered 2013-09-04: 40 mg via INTRAVENOUS
  Filled 2013-09-04 (×2): qty 8

## 2013-09-04 MED ORDER — DEXTROSE 5 % IV SOLN
1.0000 g | INTRAVENOUS | Status: DC
  Administered 2013-09-04 – 2013-09-05 (×2): 1 g via INTRAVENOUS
  Filled 2013-09-04 (×3): qty 10

## 2013-09-04 MED ORDER — AMLODIPINE BESYLATE 5 MG PO TABS
5.0000 mg | ORAL_TABLET | Freq: Every day | ORAL | Status: DC
  Administered 2013-09-04 – 2013-09-06 (×3): 5 mg via ORAL
  Filled 2013-09-04 (×3): qty 1

## 2013-09-04 MED ORDER — LABETALOL HCL 5 MG/ML IV SOLN
20.0000 mg | Freq: Once | INTRAVENOUS | Status: AC
  Administered 2013-09-04: 20 mg via INTRAVENOUS
  Filled 2013-09-04: qty 4

## 2013-09-04 NOTE — Progress Notes (Signed)
Post Partum Day 8 Subjective: no complaints  Objective: Blood pressure 151/97, pulse 74, temperature 98.1 F (36.7 C), temperature source Oral, resp. rate 18, height 5\' 5"  (1.651 m), weight 149 lb (67.586 kg), last menstrual period 11/28/2012, SpO2 100.00%, unknown if currently breastfeeding.  Physical Exam:  General: alert and no distress Lochia: appropriate Uterine Fundus: firm Incision: healing well DVT Evaluation: No evidence of DVT seen on physical exam.   Recent Labs  09/02/13 0903 09/03/13 0503  HGB 11.6* 10.9*  HCT 33.1* 31.0*    Assessment/Plan: Postpartum preeclampsia.  Improved.  D/C magnesium sulfate.  Transfer to floor.   LOS: 2 days   HARPER,CHARLES A 09/04/2013, 8:20 AM

## 2013-09-04 NOTE — Progress Notes (Signed)
UR completed 

## 2013-09-04 NOTE — Progress Notes (Addendum)
Informed patient not to fall asleep with baby in bed with her, verbalized understanding.  Risks explained to patient and significant other

## 2013-09-04 NOTE — Progress Notes (Signed)
Patient was told on admission to women's unit the risks of having the baby sleep with her in the bed. Patient was also told the same risks later in the shift. Patient insists on having the baby sleep with her in the bed.

## 2013-09-05 NOTE — Progress Notes (Signed)
Post Partum Day 9 Subjective: no complaints  Objective: Blood pressure 154/80, pulse 73, temperature 99 F (37.2 C), temperature source Oral, resp. rate 16, height 5\' 5"  (1.651 m), weight 149 lb (67.586 kg), last menstrual period 11/28/2012, SpO2 95.00%, unknown if currently breastfeeding.  Physical Exam:  General: alert and no distress Lochia: appropriate Uterine Fundus: firm Incision: none DVT Evaluation: No evidence of DVT seen on physical exam.   Recent Labs  09/02/13 0903 09/03/13 0503  HGB 11.6* 10.9*  HCT 33.1* 31.0*    Assessment/Plan: Postpartum preeclampsia.  Stable.  Continue current care.   LOS: 3 days   Bayley Yarborough A 09/05/2013, 5:59 AM

## 2013-09-06 MED ORDER — CEFUROXIME AXETIL 500 MG PO TABS
500.0000 mg | ORAL_TABLET | Freq: Once | ORAL | Status: AC
  Administered 2013-09-06: 500 mg via ORAL
  Filled 2013-09-06: qty 1

## 2013-09-06 MED ORDER — LABETALOL HCL 300 MG PO TABS: 300.0000 mg | ORAL_TABLET | Freq: Three times a day (TID) | ORAL | Status: DC

## 2013-09-06 MED ORDER — AMLODIPINE BESYLATE 5 MG PO TABS
5.0000 mg | ORAL_TABLET | Freq: Once | ORAL | Status: AC
  Administered 2013-09-06: 5 mg via ORAL
  Filled 2013-09-06: qty 1

## 2013-09-06 MED ORDER — CEFUROXIME AXETIL 500 MG PO TABS: 500.0000 mg | ORAL_TABLET | Freq: Two times a day (BID) | ORAL | Status: DC

## 2013-09-06 MED ORDER — AMLODIPINE BESYLATE 5 MG PO TABS: 10.0000 mg | ORAL_TABLET | Freq: Every day | ORAL | Status: DC

## 2013-09-06 NOTE — Progress Notes (Signed)
Discharge instructions were given. Patient was informed of low sodium diet. Patient was told to call baby love nurse for home blood pressure checks. Patient walked out with tech and discharged home with family.

## 2013-09-06 NOTE — Discharge Summary (Signed)
Physician Discharge Summary  Patient ID: Tammy Cole MRN: [id number] DOB/AGE: [birth year] 27 y.o.  Admit date: 09/02/2013 Discharge date: 09/06/2013  Admission Diagnoses:  Preeclampsia  Discharge Diagnoses: Same Active Problems:   * No active hospital problems. *   Discharged Condition: stable  Hospital Course: Admitted with increased BP.  Responded well to therapy.  Discharged home in good condition.  Consults: None  Significant Diagnostic Studies: labs: CBC, CMET  Treatments: Magnesium sulfate, Labetalol, Norvasc  Discharge Exam: Blood pressure 140/95, pulse 78, temperature 98.4 F (36.9 C), temperature source Oral, resp. rate 20, height 5\' 5"  (1.651 m), weight 142 lb 8 oz (64.638 kg), last menstrual period 11/28/2012, SpO2 99.00%, unknown if currently breastfeeding. General appearance: alert and no distress Resp: clear to auscultation bilaterally Cardio: regular rate and rhythm, S1, S2 normal, no murmur, click, rub or gallop GI: normal findings: soft, non-tender  Disposition: 01-Home or Self Care  Discharge Orders   Future Orders Complete By Expires   Diet - low sodium heart healthy  As directed    Increase activity slowly  As directed        Medication List    STOP taking these medications       aspirin-acetaminophen-caffeine 250-250-65 MG per tablet  Commonly known as:  EXCEDRIN MIGRAINE      TAKE these medications       amLODipine 5 MG tablet  Commonly known as:  NORVASC  Take 2 tablets (10 mg total) by mouth daily.     ibuprofen 600 MG tablet  Commonly known as:  ADVIL,MOTRIN  Take 1 tablet (600 mg total) by mouth every 6 (six) hours as needed for pain.     labetalol 300 MG tablet  Commonly known as:  NORMODYNE  Take 1 tablet (300 mg total) by mouth every 8 (eight) hours.           Follow-up Information   Follow up with Brailynn Breth A, MD. Schedule an appointment as soon as possible for a visit in 2 weeks.   Specialty:  Obstetrics and  Gynecology   Contact information:   [address] [place] [address] [phone]       Signed: Brock Bad 09/06/2013, 8:39 AM

## 2013-09-06 NOTE — Progress Notes (Signed)
Post Partum Day 10 Subjective: no complaints  Objective: Blood pressure 140/95, pulse 78, temperature 98.4 F (36.9 C), temperature source Oral, resp. rate 20, height 5\' 5"  (1.651 m), weight 142 lb 8 oz (64.638 kg), last menstrual period 11/28/2012, SpO2 99.00%, unknown if currently breastfeeding.  Physical Exam:  General: alert and no distress Lochia: appropriate Uterine Fundus: firm Incision: healing well DVT Evaluation: No evidence of DVT seen on physical exam.  No results found for this basename: HGB, HCT,  in the last 72 hours  Assessment/Plan: Postpartum preeclampsia.  Stable.  Discharge home.   LOS: 4 days   Teja Costen A 09/06/2013, 8:33 AM

## 2013-09-18 ENCOUNTER — Ambulatory Visit (INDEPENDENT_AMBULATORY_CARE_PROVIDER_SITE_OTHER): Payer: Medicaid Other | Admitting: Obstetrics

## 2013-09-18 ENCOUNTER — Encounter: Payer: Medicaid Other | Admitting: Obstetrics

## 2013-09-18 ENCOUNTER — Encounter: Payer: Self-pay | Admitting: Obstetrics

## 2013-09-18 DIAGNOSIS — IMO0001 Reserved for inherently not codable concepts without codable children: Secondary | ICD-10-CM

## 2013-09-18 DIAGNOSIS — Z3202 Encounter for pregnancy test, result negative: Secondary | ICD-10-CM

## 2013-09-18 NOTE — Progress Notes (Signed)
.   Subjective:     Tammy Cole is a 27 y.o. female who presents for a postpartum visit. She is 3 weeks postpartum following a spontaneous vaginal delivery. I have fully reviewed the prenatal and intrapartum course. The delivery was at 38.6 gestational weeks. Outcome: spontaneous vaginal delivery. Anesthesia: epidural. Postpartum course has been normal. Baby's course has been normal. Baby is feeding by breast. Bleeding no bleeding. Bowel function is normal. Bladder function is normal. Patient is not sexually active. Contraception method is none. Postpartum depression screening: negative.  The following portions of the patient's history were reviewed and updated as appropriate: allergies, current medications, past family history, past medical history, past social history, past surgical history and problem list.  Review of Systems Pertinent items are noted in HPI.   Objective:    LMP 11/28/2012  General:  alert and no distress   Breasts:  inspection negative, no nipple discharge or bleeding, no masses or nodularity palpable Uterus firm, NT.   Assessment:     Normal postpartum exam. Pap smear not done at today's visit.   Plan:    1. Contraception: Wants Nexplanon 2. Nexplanon Rx 3. Follow up in: 2 weeks or as needed.

## 2013-10-02 ENCOUNTER — Ambulatory Visit: Payer: Medicaid Other | Admitting: Obstetrics

## 2013-10-09 ENCOUNTER — Ambulatory Visit: Payer: Medicaid Other | Admitting: Obstetrics

## 2013-10-15 ENCOUNTER — Ambulatory Visit: Payer: Medicaid Other | Admitting: Obstetrics

## 2013-11-05 ENCOUNTER — Ambulatory Visit: Payer: Medicaid Other | Admitting: Obstetrics

## 2014-02-02 ENCOUNTER — Encounter (HOSPITAL_COMMUNITY): Payer: Self-pay | Admitting: *Deleted

## 2014-02-02 ENCOUNTER — Inpatient Hospital Stay (HOSPITAL_COMMUNITY): Payer: Medicaid Other

## 2014-02-02 ENCOUNTER — Inpatient Hospital Stay (HOSPITAL_COMMUNITY)
Admission: AD | Admit: 2014-02-02 | Discharge: 2014-02-02 | Disposition: A | Discharge status: Home / Self Care | Payer: Medicaid Other | Source: Ambulatory Visit | Attending: Obstetrics | Admitting: Obstetrics

## 2014-02-02 DIAGNOSIS — R109 Unspecified abdominal pain: Secondary | ICD-10-CM | POA: Insufficient documentation

## 2014-02-02 DIAGNOSIS — B9689 Other specified bacterial agents as the cause of diseases classified elsewhere: Secondary | ICD-10-CM | POA: Diagnosis not present

## 2014-02-02 DIAGNOSIS — A499 Bacterial infection, unspecified: Secondary | ICD-10-CM | POA: Diagnosis not present

## 2014-02-02 DIAGNOSIS — O2390 Unspecified genitourinary tract infection in pregnancy, unspecified trimester: Secondary | ICD-10-CM | POA: Diagnosis not present

## 2014-02-02 DIAGNOSIS — N760 Acute vaginitis: Secondary | ICD-10-CM | POA: Insufficient documentation

## 2014-02-02 DIAGNOSIS — Z87891 Personal history of nicotine dependence: Secondary | ICD-10-CM | POA: Insufficient documentation

## 2014-02-02 DIAGNOSIS — Z3490 Encounter for supervision of normal pregnancy, unspecified, unspecified trimester: Secondary | ICD-10-CM

## 2014-02-02 LAB — URINALYSIS, ROUTINE W REFLEX MICROSCOPIC
BILIRUBIN URINE: NEGATIVE
Glucose, UA: NEGATIVE mg/dL
HGB URINE DIPSTICK: NEGATIVE
Ketones, ur: NEGATIVE mg/dL
Leukocytes, UA: NEGATIVE
NITRITE: NEGATIVE
Protein, ur: NEGATIVE mg/dL
UROBILINOGEN UA: 0.2 mg/dL (ref 0.0–1.0)
pH: 6 (ref 5.0–8.0)

## 2014-02-02 LAB — WET PREP, GENITAL
Trich, Wet Prep: NONE SEEN
Yeast Wet Prep HPF POC: NONE SEEN

## 2014-02-02 LAB — CBC
HEMATOCRIT: 33.6 % — AB (ref 36.0–46.0)
Hemoglobin: 11.5 g/dL — ABNORMAL LOW (ref 12.0–15.0)
MCH: 31.5 pg (ref 26.0–34.0)
MCHC: 34.2 g/dL (ref 30.0–36.0)
MCV: 92.1 fL (ref 78.0–100.0)
Platelets: 160 10*3/uL (ref 150–400)
RBC: 3.65 MIL/uL — ABNORMAL LOW (ref 3.87–5.11)
RDW: 13.3 % (ref 11.5–15.5)
WBC: 5.6 10*3/uL (ref 4.0–10.5)

## 2014-02-02 LAB — POCT PREGNANCY, URINE: Preg Test, Ur: POSITIVE — AB

## 2014-02-02 LAB — HCG, QUANTITATIVE, PREGNANCY: hCG, Beta Chain, Quant, S: 5280 m[IU]/mL — ABNORMAL HIGH (ref <5)

## 2014-02-02 MED ORDER — METRONIDAZOLE 500 MG PO TABS: 500.0000 mg | ORAL_TABLET | Freq: Two times a day (BID) | ORAL | Status: DC

## 2014-02-02 NOTE — MAU Note (Signed)
Pt states has had cramping for past 3-4 days. Had +upt at home 3 days ago. Denies abnormal vaginal discharge.

## 2014-02-02 NOTE — MAU Provider Note (Signed)
History     CSN: [id number]  Arrival date and time: 02/02/14 1037   First Provider Initiated Contact with Patient 02/02/14 1303      Chief Complaint  Patient presents with  . Abdominal Cramping   HPI Tammy Cole is a 28 y.o. [phone] at [redacted]w[redacted]d. She presents with c/o low abd cramping x 3-4d. She denies changes in discharge, odor or itchng. No UTI S&S or GI changes. Same partner x 4 yr.   Past Medical History  Diagnosis Date  . Asthma   . Anemia   . Abnormal Pap smear   . Headache(784.0)   . Onset of menses     age of 31, regular, lasting 3 to 4 days, medium to heavy flow  . Gonorrhea   . Chlamydia   . Herpes     Last outbreak August 2014  . Anxiety   . Depression   . Bipolar 1 disorder     Past Surgical History  Procedure Laterality Date  . Colposcopy    . Vaginal delivery      X3  . Wisdom tooth extraction      Family History  Problem Relation Age of Onset  . Arthritis    . Asthma    . Glaucoma    . Heart disease    . Hypertension    . Prostate cancer    . Cataracts    . COPD Father   . Prostate cancer Father   . Glaucoma Father   . Cataracts Father     History  Substance Use Topics  . Smoking status: Former Smoker    Types: Cigarettes    Quit date: 12/20/2012  . Smokeless tobacco: Never Used  . Alcohol Use: No    Allergies: No Known Allergies  Prescriptions prior to admission  Medication Sig Dispense Refill  . acetaminophen (TYLENOL) 325 MG tablet Take 325 mg by mouth every 6 (six) hours as needed for headache.        Review of Systems  Constitutional: Negative for fever and chills.  Gastrointestinal: Positive for abdominal pain. Negative for nausea, vomiting, diarrhea and constipation.  Genitourinary: Negative for dysuria, urgency and frequency.   Physical Exam   Blood pressure 105/61, pulse 78, temperature 98.3 F (36.8 C), temperature source Oral, resp. rate 18, height 5\' 4"  (1.626 m), weight 176 lb 6 oz (80.003 kg), last menstrual  period 12/27/2013, not currently breastfeeding.  Physical Exam  Constitutional: She is oriented to person, place, and time. She appears well-developed and well-nourished.  GI: Soft. She exhibits no distension and no mass. There is tenderness. There is no rebound and no guarding.  Genitourinary:  Pelvic exam: Ext gen- nl anatomy,skin intact Vagina- small amt creamy yellow discharge Cx- parous Uterus- upper nl size,sl tender Adn- no masses palp, tender bil  Musculoskeletal: Normal range of motion.  Neurological: She is alert and oriented to person, place, and time.  Skin: Skin is warm and dry.  Psychiatric: She has a normal mood and affect. Her behavior is normal.    MAU Course  Procedures  MDM Results for orders placed during the hospital encounter of 02/02/14 (from the past 24 hour(s))  URINALYSIS, ROUTINE W REFLEX MICROSCOPIC     Status: Abnormal   Collection Time    02/02/14 11:15 AM      Result Value Ref Range   Color, Urine YELLOW  YELLOW   APPearance CLEAR  CLEAR   Specific Gravity, Urine >1.030 (*) 1.005 - 1.030  pH 6.0  5.0 - 8.0   Glucose, UA NEGATIVE  NEGATIVE mg/dL   Hgb urine dipstick NEGATIVE  NEGATIVE   Bilirubin Urine NEGATIVE  NEGATIVE   Ketones, ur NEGATIVE  NEGATIVE mg/dL   Protein, ur NEGATIVE  NEGATIVE mg/dL   Urobilinogen, UA 0.2  0.0 - 1.0 mg/dL   Nitrite NEGATIVE  NEGATIVE   Leukocytes, UA NEGATIVE  NEGATIVE  POCT PREGNANCY, URINE     Status: Abnormal   Collection Time    02/02/14 11:30 AM      Result Value Ref Range   Preg Test, Ur POSITIVE (*) NEGATIVE  CBC     Status: Abnormal   Collection Time    02/02/14 12:17 PM      Result Value Ref Range   WBC 5.6  4.0 - 10.5 K/uL   RBC 3.65 (*) 3.87 - 5.11 MIL/uL   Hemoglobin 11.5 (*) 12.0 - 15.0 g/dL   HCT 16.1 (*) 09.6 - 04.5 %   MCV 92.1  78.0 - 100.0 fL   MCH 31.5  26.0 - 34.0 pg   MCHC 34.2  30.0 - 36.0 g/dL   RDW 40.9  81.1 - 91.4 %   Platelets 160  150 - 400 K/uL  HCG, QUANTITATIVE,  PREGNANCY     Status: Abnormal   Collection Time    02/02/14 12:17 PM      Result Value Ref Range   hCG, Beta Chain, Quant, S 5280 (*) <5 mIU/mL  WET PREP, GENITAL     Status: Abnormal   Collection Time    02/02/14  1:17 PM      Result Value Ref Range   Yeast Wet Prep HPF POC NONE SEEN  NONE SEEN   Trich, Wet Prep NONE SEEN  NONE SEEN   Clue Cells Wet Prep HPF POC MANY (*) NONE SEEN   WBC, Wet Prep HPF POC FEW (*) NONE SEEN   US Ob Comp Less 14 Wks  02/02/2014   CLINICAL DATA:  Cramping, positive pregnancy test  EXAM: OBSTETRIC <14 WK [place] AND TRANSVAGINAL OB US  TECHNIQUE: Both transabdominal and transvaginal ultrasound examinations were performed for complete evaluation of the gestation as well as the maternal uterus, adnexal regions, and pelvic cul-de-sac. Transvaginal technique was performed to assess early pregnancy.  COMPARISON:  None for this pregnancy  FINDINGS: Intrauterine gestational sac: Visualized/normal in shape.  Yolk sac:  Visualized  Embryo:  Not visualized  Cardiac Activity: Not visualized  MSD: 7 mm   5 w   2  d                 [place] EDC: 10/03/14  Maternal uterus/adnexae: Normal ovaries.  IMPRESSION: Intrauterine gestational sac and yolk sac but no fetal pole or cardiac activity yet identified. Recommend follow-up ultrasound in 10-14 days to document appropriate pregnancy progression and for purposes of definitive dating.   Electronically Signed   By: Christiana Pellant M.D.   On: 02/02/2014 14:16   US Ob Transvaginal  02/02/2014   CLINICAL DATA:  Cramping, positive pregnancy test  EXAM: OBSTETRIC <14 WK [place] AND TRANSVAGINAL OB US  TECHNIQUE: Both transabdominal and transvaginal ultrasound examinations were performed for complete evaluation of the gestation as well as the maternal uterus, adnexal regions, and pelvic cul-de-sac. Transvaginal technique was performed to assess early pregnancy.  COMPARISON:  None for this pregnancy  FINDINGS: Intrauterine gestational sac: Visualized/normal in  shape.  Yolk sac:  Visualized  Embryo:  Not visualized  Cardiac Activity:  Not visualized  MSD: 7 mm   5 w   2  d                 US EDC: 10/03/14  Maternal uterus/adnexae: Normal ovaries.  IMPRESSION: Intrauterine gestational sac and yolk sac but no fetal pole or cardiac activity yet identified. Recommend follow-up ultrasound in 10-14 days to document appropriate pregnancy progression and for purposes of definitive dating.   Electronically Signed   By: Christiana PellantGretchen  Green M.D.   On: 02/02/2014 14:16     Assessment and Plan  ASSESSMENT: 5 2/7 wk IUGS with yolk sac, no fetal pole BV  PLAN: Flagyl 500 g bid x 7 d F/U with Dr Clearance CootsHarper for prenatal care  SchulenburgHARRIS, Olegario MessierKATHY M. 02/02/2014, 1:06 PM

## 2014-02-04 ENCOUNTER — Other Ambulatory Visit: Payer: Medicaid Other

## 2014-02-04 LAB — GC/CHLAMYDIA PROBE AMP
CT Probe RNA: NEGATIVE
GC Probe RNA: NEGATIVE

## 2014-03-17 ENCOUNTER — Inpatient Hospital Stay (HOSPITAL_COMMUNITY)
Admission: AD | Admit: 2014-03-17 | Discharge: 2014-03-17 | Disposition: A | Discharge status: Home / Self Care | Payer: Medicaid Other | Source: Ambulatory Visit | Attending: Obstetrics | Admitting: Obstetrics

## 2014-03-17 ENCOUNTER — Encounter (HOSPITAL_COMMUNITY): Payer: Self-pay | Admitting: *Deleted

## 2014-03-17 ENCOUNTER — Inpatient Hospital Stay (HOSPITAL_COMMUNITY): Payer: Medicaid Other

## 2014-03-17 DIAGNOSIS — O209 Hemorrhage in early pregnancy, unspecified: Secondary | ICD-10-CM | POA: Insufficient documentation

## 2014-03-17 DIAGNOSIS — O021 Missed abortion: Secondary | ICD-10-CM | POA: Diagnosis not present

## 2014-03-17 DIAGNOSIS — Z87891 Personal history of nicotine dependence: Secondary | ICD-10-CM | POA: Insufficient documentation

## 2014-03-17 DIAGNOSIS — R109 Unspecified abdominal pain: Secondary | ICD-10-CM | POA: Insufficient documentation

## 2014-03-17 LAB — CBC
HCT: 34.4 % — ABNORMAL LOW (ref 36.0–46.0)
HEMOGLOBIN: 11.8 g/dL — AB (ref 12.0–15.0)
MCH: 31.6 pg (ref 26.0–34.0)
MCHC: 34.3 g/dL (ref 30.0–36.0)
MCV: 92.2 fL (ref 78.0–100.0)
PLATELETS: 176 10*3/uL (ref 150–400)
RBC: 3.73 MIL/uL — ABNORMAL LOW (ref 3.87–5.11)
RDW: 12.8 % (ref 11.5–15.5)
WBC: 6.4 10*3/uL (ref 4.0–10.5)

## 2014-03-17 MED ORDER — HYDROCODONE-ACETAMINOPHEN 5-325 MG PO TABS: 1.0000 | ORAL_TABLET | Freq: Four times a day (QID) | ORAL | Status: DC | PRN

## 2014-03-17 NOTE — MAU Provider Note (Signed)
History     CSN: [id number]  Arrival date and time: 03/17/14 1955   None     Chief Complaint  Patient presents with  . Abdominal Cramping  . Vaginal Bleeding   Abdominal Cramping  Vaginal Bleeding    Tammy Cole is a 28 y.o. [id number] at 6368w3d who presents today with cramping and bleeding. She states that she started bleeding earlier today, and that it is similar to a menstrual period. She has not passed any tissue or clots at this time.   Past Medical History  Diagnosis Date  . Asthma   . Anemia   . Abnormal Pap smear   . Headache(784.0)   . Onset of menses     age of 89, regular, lasting 3 to 4 days, medium to heavy flow  . Gonorrhea   . Chlamydia   . Herpes     Last outbreak August 2014  . Anxiety   . Depression   . Bipolar 1 disorder     Past Surgical History  Procedure Laterality Date  . Colposcopy    . Vaginal delivery      X3  . Wisdom tooth extraction      Family History  Problem Relation Age of Onset  . Arthritis    . Asthma    . Glaucoma    . Heart disease    . Hypertension    . Prostate cancer    . Cataracts    . COPD Father   . Prostate cancer Father   . Glaucoma Father   . Cataracts Father     History  Substance Use Topics  . Smoking status: Former Smoker    Types: Cigarettes    Quit date: 12/20/2012  . Smokeless tobacco: Never Used  . Alcohol Use: No    Allergies: No Known Allergies  Prescriptions prior to admission  Medication Sig Dispense Refill  . acetaminophen (TYLENOL) 325 MG tablet Take 325 mg by mouth every 6 (six) hours as needed for headache.      . metroNIDAZOLE (FLAGYL) 500 MG tablet Take 1 tablet (500 mg total) by mouth 2 (two) times daily.  14 tablet  0    Review of Systems  Genitourinary: Positive for vaginal bleeding.   Physical Exam   Blood pressure 102/52, pulse 77, temperature 98.9 F (37.2 C), temperature source Oral, resp. rate 18, height 5\' 4"  (1.626 m), weight 80.468 kg (177 lb 6.4 oz), last  menstrual period 12/27/2013, not currently breastfeeding.  Physical Exam  Nursing note and vitals reviewed. Constitutional: She is oriented to person, place, and time. She appears well-developed and well-nourished. No distress.  Cardiovascular: Normal rate.   Respiratory: Effort normal.  GI: Soft. There is no tenderness. There is no rebound.  Neurological: She is alert and oriented to person, place, and time.  Skin: Skin is warm and dry.  Psychiatric: She has a normal mood and affect.    MAU Course  Procedures  Results for orders placed during the hospital encounter of 03/17/14 (from the past 24 hour(s))  CBC     Status: Abnormal   Collection Time    03/17/14  8:00 PM      Result Value Ref Range   WBC 6.4  4.0 - 10.5 K/uL   RBC 3.73 (*) 3.87 - 5.11 MIL/uL   Hemoglobin 11.8 (*) 12.0 - 15.0 g/dL   HCT 14.734.4 (*) 82.936.0 - 56.246.0 %   MCV 92.2  78.0 - 100.0 fL   MCH 31.6  26.0 - 34.0 pg   MCHC 34.3  30.0 - 36.0 g/dL   RDW 40.912.8  81.111.5 - 91.415.5 %   Platelets 176  150 - 400 K/uL   EXAM:  TRANSVAGINAL OB ULTRASOUND  TECHNIQUE:  Transvaginal ultrasound was performed for complete evaluation of the  gestation as well as the maternal uterus, adnexal regions, and  pelvic cul-de-sac.   COMPARISON: Ob ultrasound 02/02/2014.   FINDINGS:  Intrauterine gestational sac: Visualized/normal in shape.  Yolk sac: Not visualized.  Embryo: Visualized.  Cardiac Activity: Not detected.  CRL: 2.34 cm 9 w 1 d  Maternal uterus/adnexae: Unremarkable.   IMPRESSION:  Findings meet definitive criteria for failed pregnancy. This follows  SRU consensus guidelines: Diagnostic Criteria for Nonviable  Pregnancy Early in the First Trimester. Macy Mis Engl J Med  [phone].   Electronically Signed  By: Drusilla Kannerhomas Dalessio M.D.  On: 03/17/2014 21:07   2124: D/W Dr. Clearance CootsHarper, patient can be dc home. No Cytotec at this time. Have her make an appointment for 1 week in the office.   Assessment and Plan   1. Missed  abortion    Bleeding precautions reviewed Return to MAU as needed FU with the office in one week (Has an appointment for 03/25/14)    Medication List         HYDROcodone-acetaminophen 5-325 MG per tablet  Commonly known as:  NORCO/VICODIN  Take 1 tablet by mouth every 6 (six) hours as needed for moderate pain.       Follow-up Information   Follow up with HARPER,CHARLES A, MD. (As scheduled)    Specialty:  Obstetrics and Gynecology   Contact information:   [address] [place] [address] [phone]        Tawnya CrookHeather Donovan Danajah Birdsell 03/17/2014, 9:13 PM

## 2014-03-17 NOTE — MAU Note (Signed)
Pt G6 P4 at 11.3wks, cramping and bleeding since last night.

## 2014-03-17 NOTE — Discharge Instructions (Signed)
Miscarriage A miscarriage is the sudden loss of an unborn baby (fetus) before the 20th week of pregnancy. Most miscarriages happen in the first 3 months of pregnancy. Sometimes, it happens before a woman even knows she is pregnant. A miscarriage is also called a "spontaneous miscarriage" or "early pregnancy loss." Having a miscarriage can be an emotional experience. Talk with your caregiver about any questions you may have about miscarrying, the grieving process, and your future pregnancy plans. CAUSES   Problems with the fetal chromosomes that make it impossible for the baby to develop normally. Problems with the baby's genes or chromosomes are most often the result of errors that occur, by chance, as the embryo divides and grows. The problems are not inherited from the parents.  Infection of the cervix or uterus.   Hormone problems.   Problems with the cervix, such as having an incompetent cervix. This is when the tissue in the cervix is not strong enough to hold the pregnancy.   Problems with the uterus, such as an abnormally shaped uterus, uterine fibroids, or congenital abnormalities.   Certain medical conditions.   Smoking, drinking alcohol, or taking illegal drugs.   Trauma.  Often, the cause of a miscarriage is unknown.  SYMPTOMS   Vaginal bleeding or spotting, with or without cramps or pain.  Pain or cramping in the abdomen or lower back.  Passing fluid, tissue, or blood clots from the vagina. DIAGNOSIS  Your caregiver will perform a physical exam. You may also have an ultrasound to confirm the miscarriage. Blood or urine tests may also be ordered. TREATMENT   Sometimes, treatment is not necessary if you naturally pass all the fetal tissue that was in the uterus. If some of the fetus or placenta remains in the body (incomplete miscarriage), tissue left behind may become infected and must be removed. Usually, a dilation and curettage (D and C) procedure is performed.  During a D and C procedure, the cervix is widened (dilated) and any remaining fetal or placental tissue is gently removed from the uterus.  Antibiotic medicines are prescribed if there is an infection. Other medicines may be given to reduce the size of the uterus (contract) if there is a lot of bleeding.  If you have Rh negative blood and your baby was Rh positive, you will need a Rh immunoglobulin shot. This shot will protect any future baby from having Rh blood problems in future pregnancies. HOME CARE INSTRUCTIONS   Your caregiver may order bed rest or may allow you to continue light activity. Resume activity as directed by your caregiver.  Have someone help with home and family responsibilities during this time.   Keep track of the number of sanitary pads you use each day and how soaked (saturated) they are. Write down this information.   Do not use tampons. Do not douche or have sexual intercourse until approved by your caregiver.   Only take over-the-counter or prescription medicines for pain or discomfort as directed by your caregiver.   Do not take aspirin. Aspirin can cause bleeding.   Keep all follow-up appointments with your caregiver.   If you or your partner have problems with grieving, talk to your caregiver or seek counseling to help cope with the pregnancy loss. Allow enough time to grieve before trying to get pregnant again.  SEEK IMMEDIATE MEDICAL CARE IF:   You have severe cramps or pain in your back or abdomen.  You have a fever.  You pass large blood clots (walnut-sized   or larger) ortissue from your vagina. Save any tissue for your caregiver to inspect.   Your bleeding increases.   You have a thick, bad-smelling vaginal discharge.  You become lightheaded, weak, or you faint.   You have chills.  MAKE SURE YOU:  Understand these instructions.  Will watch your condition.  Will get help right away if you are not doing well or get  worse. Document Released: 05/04/2001 Document Revised: 03/05/2013 Document Reviewed: 12/28/2011 ExitCare Patient Information 2014 [employer].  

## 2014-03-25 ENCOUNTER — Encounter: Payer: Medicaid Other | Admitting: Obstetrics & Gynecology

## 2014-04-04 ENCOUNTER — Ambulatory Visit: Payer: Medicaid Other | Admitting: Obstetrics & Gynecology

## 2014-07-25 ENCOUNTER — Ambulatory Visit: Payer: Medicaid Other | Admitting: Obstetrics & Gynecology

## 2014-07-29 ENCOUNTER — Inpatient Hospital Stay (HOSPITAL_COMMUNITY)
Admission: AD | Admit: 2014-07-29 | Discharge: 2014-07-29 | Disposition: A | Discharge status: Home / Self Care | Payer: Medicaid Other | Source: Ambulatory Visit | Attending: Obstetrics | Admitting: Obstetrics

## 2014-07-29 ENCOUNTER — Encounter (HOSPITAL_COMMUNITY): Payer: Self-pay

## 2014-07-29 ENCOUNTER — Inpatient Hospital Stay (HOSPITAL_COMMUNITY): Payer: Medicaid Other

## 2014-07-29 DIAGNOSIS — Z87891 Personal history of nicotine dependence: Secondary | ICD-10-CM | POA: Diagnosis not present

## 2014-07-29 DIAGNOSIS — Z331 Pregnant state, incidental: Secondary | ICD-10-CM

## 2014-07-29 DIAGNOSIS — O9989 Other specified diseases and conditions complicating pregnancy, childbirth and the puerperium: Principal | ICD-10-CM

## 2014-07-29 DIAGNOSIS — O208 Other hemorrhage in early pregnancy: Secondary | ICD-10-CM | POA: Insufficient documentation

## 2014-07-29 DIAGNOSIS — Z3490 Encounter for supervision of normal pregnancy, unspecified, unspecified trimester: Secondary | ICD-10-CM

## 2014-07-29 DIAGNOSIS — R109 Unspecified abdominal pain: Secondary | ICD-10-CM | POA: Insufficient documentation

## 2014-07-29 DIAGNOSIS — O99891 Other specified diseases and conditions complicating pregnancy: Secondary | ICD-10-CM | POA: Diagnosis not present

## 2014-07-29 HISTORY — DX: Unspecified infectious disease: B99.9

## 2014-07-29 HISTORY — DX: Unspecified abnormal cytological findings in specimens from vagina: R87.629

## 2014-07-29 HISTORY — DX: Gestational (pregnancy-induced) hypertension without significant proteinuria, unspecified trimester: O13.9

## 2014-07-29 LAB — URINALYSIS, ROUTINE W REFLEX MICROSCOPIC
Bilirubin Urine: NEGATIVE
Glucose, UA: NEGATIVE mg/dL
Hgb urine dipstick: NEGATIVE
Ketones, ur: NEGATIVE mg/dL
NITRITE: NEGATIVE
PH: 6 (ref 5.0–8.0)
PROTEIN: NEGATIVE mg/dL
Specific Gravity, Urine: 1.02 (ref 1.005–1.030)
Urobilinogen, UA: 0.2 mg/dL (ref 0.0–1.0)

## 2014-07-29 LAB — POCT PREGNANCY, URINE: PREG TEST UR: POSITIVE — AB

## 2014-07-29 LAB — URINE MICROSCOPIC-ADD ON
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
## 2014-07-29 NOTE — MAU Provider Note (Signed)
None     Chief Complaint:  Possible Pregnancy and Abdominal Cramping   TEMEKA PORE is  28 y.o. [id number] at [redacted]w[redacted]d by LMP presents complaining of Possible Pregnancy and Abdominal Cramping Her UPT here is positive.  Feels "like I'm going to start my period".  No bleeding.    Obstetrical/Gynecological History: OB History   Grav Para Term Preterm Abortions TAB SAB Ect Mult Living   Past Medical History: Past Medical History  Diagnosis Date  . Asthma   . Anemia   . Abnormal Pap smear   . Headache(784.0)   . Onset of menses     age of 42, regular, lasting 3 to 4 days, medium to heavy flow  . Gonorrhea   . Chlamydia   . Herpes     Last outbreak August 2014  . Anxiety   . Bipolar 1 disorder   . Pregnancy induced hypertension   . Preterm labor   . Infection     UTI  . Depression     doing well  . Vaginal Pap smear, abnormal     colpo, ok since    Past Surgical History: Past Surgical History  Procedure Laterality Date  . Colposcopy    . Vaginal delivery      X3  . Wisdom tooth extraction      Family History: Family History  Problem Relation Age of Onset  . Arthritis    . Asthma    . Glaucoma    . Heart disease    . Hypertension    . Prostate cancer    . Cataracts    . COPD Father   . Prostate cancer Father   . Glaucoma Father   . Cataracts Father   . Hypertension Father   . Hypertension Maternal Grandmother   . Diabetes Maternal Grandmother     Social History: History  Substance Use Topics  . Smoking status: Former Smoker    Types: Cigarettes    Quit date: 12/20/2012  . Smokeless tobacco: Former Neurosurgeon     Comment: vapor-quit  . Alcohol Use: No    Allergies: No Known Allergies  Meds:  Prescriptions prior to admission  Medication Sig Dispense Refill  . HYDROcodone-acetaminophen (NORCO/VICODIN) 5-325 MG per tablet Take 1 tablet by mouth every 6 (six) hours as needed for moderate pain.  20 tablet  0    Review of Systems    Constitutional: Negative for fever and chills Eyes: Negative for visual disturbances Respiratory: Negative for shortness of breath, dyspnea Cardiovascular: Negative for chest pain or palpitations  Gastrointestinal: Negative for vomiting, diarrhea and constipation Genitourinary: Negative for dysuria and urgency Musculoskeletal: Negative for back pain, joint pain, myalgias  Neurological: Negative for dizziness and headaches     Physical Exam  Blood pressure 113/59, pulse 70, temperature 98.4 F (36.9 C), temperature source Oral, resp. rate 16, height  (1.6 m), weight 76.476 kg (168 lb 9.6 oz), last menstrual period 06/16/2014, SpO2 99.00%, unknown if currently breastfeeding. GENERAL: Well-developed, well-nourished female in no acute distress.  LUNGS: Clear to auscultation bilaterally.  HEART: Regular rate and rhythm. ABDOMEN: Soft, nontender, nondistended, gravid.  EXTREMITIES: Nontender, no edema, 2+ distal pulses. DTR's 2+ CERVICAL EXAM: LTC    Labs: Results for orders placed during the hospital encounter of 07/29/14 (from the past 24 hour(s))  URINALYSIS, ROUTINE W REFLEX MICROSCOPIC   Collection Time    07/29/14  1:45  PM      Result Value Ref Range   Color, Urine YELLOW  YELLOW   APPearance HAZY (*) CLEAR   Specific Gravity, Urine 1.020  1.005 - 1.030   pH 6.0  5.0 - 8.0   Glucose, UA NEGATIVE  NEGATIVE mg/dL   Hgb urine dipstick NEGATIVE  NEGATIVE   Bilirubin Urine NEGATIVE  NEGATIVE   Ketones, ur NEGATIVE  NEGATIVE mg/dL   Protein, ur NEGATIVE  NEGATIVE mg/dL   Urobilinogen, UA 0.2  0.0 - 1.0 mg/dL   Nitrite NEGATIVE  NEGATIVE   Leukocytes, UA MODERATE (*) NEGATIVE  URINE MICROSCOPIC-ADD ON   Collection Time    07/29/14  1:45 PM      Result Value Ref Range   Squamous Epithelial / LPF FEW (*) RARE   WBC, UA 11-20  <3 WBC/hpf   Bacteria, UA FEW (*) RARE   Urine-Other MUCOUS PRESENT    POCT PREGNANCY, URINE   Collection Time    07/29/14  2:09 PM       Result Value Ref Range   Preg Test, Ur POSITIVE (*) NEGATIVE   Imaging Studies:  CLINICAL DATA:  Pelvic pain and cramping.  Unsure of LMP.   EXAM: OBSTETRIC <14 WK [place] AND TRANSVAGINAL OB US   TECHNIQUE: Both transabdominal and transvaginal ultrasound examinations were performed for complete evaluation of the gestation as well as the maternal uterus, adnexal regions, and pelvic cul-de-sac. Transvaginal technique was performed to assess early pregnancy.   COMPARISON:  None.   FINDINGS: Intrauterine gestational sac: Visualized/normal in shape.   Yolk sac:  Not visualized   Embryo:  Not visualized   MSD:  9  mm   5 w   4  d   Maternal uterus/adnexae: Tiny subchorionic hemorrhage noted. Both ovaries are normal in appearance. No mass or free fluid identified.   IMPRESSION: Single early intrauterine gestational sac with estimated gestational age of [redacted] weeks 4 days by mean sac diameter.   Tiny subchorionic hemorrhage.     Electronically Signed   By: Myles Rosenthal M.D.   On: 07/29/2014 16:55   Assessment: AYVEN GLASCO is  28 y.o. [id number] at [redacted]w[redacted]d presents with early IUP.  Plan: F/U u/s with Dr. Clearance Coots 10-14 days.  F/U if starts to bleed or if cramps become worse  CRESENZO-DISHMAN,Tosca Pletz 9/7/20154:33 PM '

## 2014-07-29 NOTE — MAU Note (Signed)
Patient states she has been cramping since 8-28. Has had a positive home pregnancy test. Denies nausea, vomiting, bleeding or discharge. Feels the back of her head pulsing.

## 2014-07-29 NOTE — Discharge Instructions (Signed)

## 2014-08-16 ENCOUNTER — Other Ambulatory Visit: Payer: Self-pay | Admitting: *Deleted

## 2014-08-16 DIAGNOSIS — O219 Vomiting of pregnancy, unspecified: Secondary | ICD-10-CM

## 2014-08-16 MED ORDER — DOXYLAMINE-PYRIDOXINE 10-10 MG PO TBEC: DELAYED_RELEASE_TABLET | ORAL | Status: DC

## 2014-08-18 ENCOUNTER — Encounter (HOSPITAL_COMMUNITY): Payer: Self-pay | Admitting: *Deleted

## 2014-08-18 ENCOUNTER — Inpatient Hospital Stay (HOSPITAL_COMMUNITY): Payer: Medicaid Other

## 2014-08-18 ENCOUNTER — Inpatient Hospital Stay (HOSPITAL_COMMUNITY)
Admission: AD | Admit: 2014-08-18 | Discharge: 2014-08-18 | Disposition: A | Discharge status: Home / Self Care | Payer: Medicaid Other | Source: Ambulatory Visit | Attending: Obstetrics and Gynecology | Admitting: Obstetrics and Gynecology

## 2014-08-18 DIAGNOSIS — O209 Hemorrhage in early pregnancy, unspecified: Secondary | ICD-10-CM | POA: Insufficient documentation

## 2014-08-18 DIAGNOSIS — A5901 Trichomonal vulvovaginitis: Secondary | ICD-10-CM | POA: Insufficient documentation

## 2014-08-18 DIAGNOSIS — Z87891 Personal history of nicotine dependence: Secondary | ICD-10-CM | POA: Diagnosis not present

## 2014-08-18 DIAGNOSIS — O98819 Other maternal infectious and parasitic diseases complicating pregnancy, unspecified trimester: Secondary | ICD-10-CM | POA: Diagnosis not present

## 2014-08-18 DIAGNOSIS — A599 Trichomoniasis, unspecified: Secondary | ICD-10-CM

## 2014-08-18 LAB — URINE MICROSCOPIC-ADD ON

## 2014-08-18 LAB — URINALYSIS, ROUTINE W REFLEX MICROSCOPIC
BILIRUBIN URINE: NEGATIVE
GLUCOSE, UA: NEGATIVE mg/dL
Ketones, ur: NEGATIVE mg/dL
Nitrite: NEGATIVE
Protein, ur: NEGATIVE mg/dL
SPECIFIC GRAVITY, URINE: 1.025 (ref 1.005–1.030)
UROBILINOGEN UA: 0.2 mg/dL (ref 0.0–1.0)
pH: 6 (ref 5.0–8.0)

## 2014-08-18 MED ORDER — METRONIDAZOLE 500 MG PO TABS: 2000.0000 mg | ORAL_TABLET | Freq: Once | ORAL | Status: DC

## 2014-08-18 NOTE — Discharge Instructions (Signed)
Pelvic Rest °Pelvic rest is sometimes recommended for women when:  °· The placenta is partially or completely covering the opening of the cervix (placenta previa). °· There is bleeding between the uterine wall and the amniotic sac in the first trimester (subchorionic hemorrhage). °· The cervix begins to open without labor starting (incompetent cervix, cervical insufficiency). °· The labor is too early (preterm labor). °HOME CARE INSTRUCTIONS °· Do not have sexual intercourse, stimulation, or an orgasm. °· Do not use tampons, douche, or put anything in the vagina. °· Do not lift anything over 10 pounds (4.5 kg). °· Avoid strenuous activity or straining your pelvic muscles. °SEEK MEDICAL CARE IF:  °· You have any vaginal bleeding during pregnancy. Treat this as a potential emergency. °· You have cramping pain felt low in the stomach (stronger than menstrual cramps). °· You notice vaginal discharge (watery, mucus, or bloody). °· You have a low, dull backache. °· There are regular contractions or uterine tightening. °SEEK IMMEDIATE MEDICAL CARE IF: °You have vaginal bleeding and have placenta previa.  °Document Released: 03/05/2011 Document Revised: 01/31/2012 Document Reviewed: 03/05/2011 °ExitCare® Patient Information ©2015 [employer]. This information is not intended to replace advice given to you by your health care provider. Make sure you discuss any questions you have with your health care provider. °Trichomoniasis °Trichomoniasis is an infection caused by an organism called Trichomonas. The infection can affect both women and men. In women, the outer female genitalia and the vagina are affected. In men, the penis is mainly affected, but the prostate and other reproductive organs can also be involved. Trichomoniasis is a sexually transmitted infection (STI) and is most often passed to another person through sexual contact.  °RISK FACTORS °· Having unprotected sexual intercourse. °· Having sexual intercourse  with an infected partner. °SIGNS AND SYMPTOMS  °Symptoms of trichomoniasis in women include: °· Abnormal gray-green frothy vaginal discharge. °· Itching and irritation of the vagina. °· Itching and irritation of the area outside the vagina. °Symptoms of trichomoniasis in men include:  °· Penile discharge with or without pain. °· Pain during urination. This results from inflammation of the urethra. °DIAGNOSIS  °Trichomoniasis may be found during a Pap test or physical exam. Your health care provider may use one of the following methods to help diagnose this infection: °· Examining vaginal discharge under a microscope. For men, urethral discharge would be examined. °· Testing the pH of the vagina with a test tape. °· Using a vaginal swab test that checks for the Trichomonas organism. A test is available that provides results within a few minutes. °· Doing a culture test for the organism. This is not usually needed. °TREATMENT  °· You may be given medicine to fight the infection. Women should inform their health care provider if they could be or are pregnant. Some medicines used to treat the infection should not be taken during pregnancy. °· Your health care provider may recommend over-the-counter medicines or creams to decrease itching or irritation. °· Your sexual partner will need to be treated if infected. °HOME CARE INSTRUCTIONS  °· Take medicines only as directed by your health care provider. °· Take over-the-counter medicine for itching or irritation as directed by your health care provider. °· Do not have sexual intercourse while you have the infection. °· Women should not douche or wear tampons while they have the infection. °· Discuss your infection with your partner. Your partner may have gotten the infection from you, or you may have gotten it from your partner. °·   Have your sex partner get examined and treated if necessary. °· Practice safe, informed, and protected sex. °· See your health care provider for  other STI testing. °SEEK MEDICAL CARE IF:  °· You still have symptoms after you finish your medicine. °· You develop abdominal pain. °· You have pain when you urinate. °· You have bleeding after sexual intercourse. °· You develop a rash. °· Your medicine makes you sick or makes you throw up (vomit). °MAKE SURE YOU: °· Understand these instructions. °· Will watch your condition. °· Will get help right away if you are not doing well or get worse. °Document Released: 05/04/2001 Document Revised: 03/25/2014 Document Reviewed: 08/20/2013 °ExitCare® Patient Information ©2015 [employer]. This information is not intended to replace advice given to you by your health care provider. Make sure you discuss any questions you have with your health care provider. ° °

## 2014-08-18 NOTE — MAU Note (Signed)
Patient presents with complaint of bright red vaginal bleeding since this morning that she notices while wiping after voiding.

## 2014-08-18 NOTE — MAU Provider Note (Signed)
History     CSN: [id number]  Arrival date and time: 08/18/14 7829   First Provider Initiated Contact with Patient 08/18/14 [phone]      Chief Complaint  Patient presents with  . Vaginal Bleeding   HPI [name] is a 28 y.o. [phone] at [redacted]w[redacted]d who presents to MAU today with complaint of vaginal bleeding since this morning. She states that the bleeding is very light spotting. She had [place] at [redacted]w[redacted]d with IUGS without YS or FP and small [facility]. Patient was supposed to follow-up with [facility] for first OB visit and follow-up US to confirm IUP, but states that they would not see her until mid-October. The patient denies vaginal discharge, lower abdominal pain or UTI symptoms. She states +N/V over the last 4 days and mild upper abdominal pain recently.    OB History   Grav Para Term Preterm Abortions TAB SAB Ect Mult Living   Past Medical History  Diagnosis Date  . Asthma   . Anemia   . Abnormal Pap smear   . Headache(784.0)   . Onset of menses     age of 6, regular, lasting 3 to 4 days, medium to heavy flow  . Gonorrhea   . Chlamydia   . Herpes     Last outbreak August 2014  . Anxiety   . Bipolar 1 disorder   . Pregnancy induced hypertension   . Preterm labor   . Infection     UTI  . Depression     doing well  . Vaginal Pap smear, abnormal     colpo, ok since    Past Surgical History  Procedure Laterality Date  . Colposcopy    . Vaginal delivery      X3  . Wisdom tooth extraction      Family History  Problem Relation Age of Onset  . Arthritis    . Asthma    . Glaucoma    . Heart disease    . Hypertension    . Prostate cancer    . Cataracts    . COPD Father   . Prostate cancer Father   . Glaucoma Father   . Cataracts Father   . Hypertension Father   . Hypertension Maternal Grandmother   . Diabetes Maternal Grandmother     History  Substance Use Topics  . Smoking status: Former Smoker    Types: Cigarettes    Quit date: 12/20/2012   . Smokeless tobacco: Former Neurosurgeon     Comment: vapor-quit  . Alcohol Use: No    Allergies: No Known Allergies  No prescriptions prior to admission    ROS Physical Exam   Blood pressure 110/66, pulse 57, temperature 98 F (36.7 C), temperature source Oral, resp. rate 18, height  (1.626 m), weight 166 lb (75.297 kg), last menstrual period 06/16/2014, unknown if currently breastfeeding.  Physical Exam  Constitutional: She is oriented to person, place, and time. She appears well-developed and well-nourished. No distress.  HENT:  Head: Normocephalic.  Cardiovascular: Normal rate.   Respiratory: Effort normal.  GI: Soft. She exhibits no distension and no mass. There is no tenderness. There is no rebound and no guarding.  Genitourinary: Uterus is enlarged (appropriate for GA). Uterus is not tender. Cervix exhibits no motion tenderness, no discharge and no friability. No bleeding around the vagina. Vaginal discharge (small amount of thin, white discharge noted) found.  Neurological: She is alert and oriented to person, place, and time.  Skin: Skin is warm and dry. No erythema.  Psychiatric: She has a normal mood and affect.   Results for orders placed during the hospital encounter of 08/18/14 (from the past 24 hour(s))  URINALYSIS, ROUTINE W REFLEX MICROSCOPIC     Status: Abnormal   Collection Time    08/18/14  9:34 AM      Result Value Ref Range   Color, Urine YELLOW  YELLOW   APPearance CLOUDY (*) CLEAR   Specific Gravity, Urine 1.025  1.005 - 1.030   pH 6.0  5.0 - 8.0   Glucose, UA NEGATIVE  NEGATIVE mg/dL   Hgb urine dipstick LARGE (*) NEGATIVE   Bilirubin Urine NEGATIVE  NEGATIVE   Ketones, ur NEGATIVE  NEGATIVE mg/dL   Protein, ur NEGATIVE  NEGATIVE mg/dL   Urobilinogen, UA 0.2  0.0 - 1.0 mg/dL   Nitrite NEGATIVE  NEGATIVE   Leukocytes, UA SMALL (*) NEGATIVE  URINE MICROSCOPIC-ADD ON     Status: Abnormal   Collection Time    08/18/14  9:34 AM      Result Value Ref  Range   Squamous Epithelial / LPF FEW (*) RARE   WBC, UA 11-20  <3 WBC/hpf   RBC / HPF TOO NUMEROUS TO COUNT  <3 RBC/hpf   Urine-Other MUCOUS PRESENT     US Ob Transvaginal  08/18/2014   CLINICAL DATA:  Vaginal spotting.  EXAM: TRANSVAGINAL OB ULTRASOUND  TECHNIQUE: Transvaginal ultrasound was performed for complete evaluation of the gestation as well as the maternal uterus, adnexal regions, and pelvic cul-de-sac.  COMPARISON:  07/29/2014  FINDINGS: Intrauterine gestational sac: Visualized/normal in shape.  Yolk sac:  Present  Embryo:  Present  Cardiac Activity: Present  Heart Rate: 140 bpm  CRL:   19  mm   8 w 3 d                  [place] EDC: 03/27/2015  Maternal uterus/adnexae: Normal appearance of the left ovary measuring 4.4 x 2.2 x 3.0 cm. Normal appearance of the right ovary measuring 3.6 x 1.6 x 1.8 cm. No free fluid. No subchorionic hemorrhage.  IMPRESSION: Single live intrauterine pregnancy. Calculated gestational age is 8 weeks and 3 days.   Electronically Signed   By: Richarda Overlie M.D.   On: 08/18/2014 11:22    MAU Course  Procedures None  MDM [place] to confirm viability today  Assessment and Plan  A: SIUP at [redacted]w[redacted]d Trichomonas, urine Vaginal bleeding in pregnancy prior to [redacted] weeks gestation  P: Discharge home Rx for Flagyl given to patient Partner treatment advised Bleeding/first trimester precautions discussed Patient advised to keep appointment to start prenatal care with [facility] as scheduled Patient may return to MAU as needed or if her condition were to change or worsen   Marny Lowenstein, PA-C  08/18/2014, 11:34 AM

## 2014-08-18 NOTE — MAU Provider Note (Signed)
Attestation of Attending Supervision of Advanced Practitioner: Evaluation and management procedures were performed by the PA/NP/CNM/OB Fellow under my supervision/collaboration. Chart reviewed and agree with management and plan.  Rylee Nuzum V 08/18/2014 4:59 PM

## 2014-09-10 ENCOUNTER — Encounter: Payer: Self-pay | Admitting: Obstetrics

## 2014-09-23 ENCOUNTER — Encounter (HOSPITAL_COMMUNITY): Payer: Self-pay | Admitting: *Deleted

## 2014-10-09 ENCOUNTER — Emergency Department (HOSPITAL_COMMUNITY)
Admission: EM | Admit: 2014-10-09 | Discharge: 2014-10-09 | Disposition: A | Discharge status: Home / Self Care | Payer: Medicaid Other | Attending: Emergency Medicine | Admitting: Emergency Medicine

## 2014-10-09 ENCOUNTER — Encounter (HOSPITAL_COMMUNITY): Payer: Self-pay | Admitting: Emergency Medicine

## 2014-10-09 DIAGNOSIS — Z87891 Personal history of nicotine dependence: Secondary | ICD-10-CM | POA: Insufficient documentation

## 2014-10-09 DIAGNOSIS — O99352 Diseases of the nervous system complicating pregnancy, second trimester: Secondary | ICD-10-CM | POA: Insufficient documentation

## 2014-10-09 DIAGNOSIS — Z8751 Personal history of pre-term labor: Secondary | ICD-10-CM | POA: Insufficient documentation

## 2014-10-09 DIAGNOSIS — G4489 Other headache syndrome: Secondary | ICD-10-CM | POA: Diagnosis not present

## 2014-10-09 DIAGNOSIS — Z8659 Personal history of other mental and behavioral disorders: Secondary | ICD-10-CM | POA: Diagnosis not present

## 2014-10-09 DIAGNOSIS — Z3A16 16 weeks gestation of pregnancy: Secondary | ICD-10-CM | POA: Diagnosis not present

## 2014-10-09 DIAGNOSIS — Z792 Long term (current) use of antibiotics: Secondary | ICD-10-CM | POA: Insufficient documentation

## 2014-10-09 DIAGNOSIS — J3489 Other specified disorders of nose and nasal sinuses: Secondary | ICD-10-CM | POA: Insufficient documentation

## 2014-10-09 DIAGNOSIS — Z8619 Personal history of other infectious and parasitic diseases: Secondary | ICD-10-CM | POA: Insufficient documentation

## 2014-10-09 DIAGNOSIS — Z862 Personal history of diseases of the blood and blood-forming organs and certain disorders involving the immune mechanism: Secondary | ICD-10-CM | POA: Diagnosis not present

## 2014-10-09 DIAGNOSIS — J45909 Unspecified asthma, uncomplicated: Secondary | ICD-10-CM | POA: Insufficient documentation

## 2014-10-09 DIAGNOSIS — IMO0001 Reserved for inherently not codable concepts without codable children: Secondary | ICD-10-CM

## 2014-10-09 HISTORY — DX: Unspecified pre-eclampsia, unspecified trimester: O14.90

## 2014-10-09 LAB — URINALYSIS, ROUTINE W REFLEX MICROSCOPIC
Bilirubin Urine: NEGATIVE
GLUCOSE, UA: NEGATIVE mg/dL
Hgb urine dipstick: NEGATIVE
KETONES UR: NEGATIVE mg/dL
NITRITE: NEGATIVE
Protein, ur: NEGATIVE mg/dL
Specific Gravity, Urine: 1.023 (ref 1.005–1.030)
UROBILINOGEN UA: 0.2 mg/dL (ref 0.0–1.0)
pH: 7 (ref 5.0–8.0)

## 2014-10-09 LAB — URINE MICROSCOPIC-ADD ON

## 2014-10-09 MED ORDER — ACETAMINOPHEN 325 MG PO TABS
650.0000 mg | ORAL_TABLET | Freq: Once | ORAL | Status: AC
  Administered 2014-10-09: 650 mg via ORAL
  Filled 2014-10-09: qty 2

## 2014-10-09 MED ORDER — PRENATAL COMPLETE 14-0.4 MG PO TABS: 1.0000 | ORAL_TABLET | Freq: Every day | ORAL | Status: DC

## 2014-10-09 NOTE — ED Provider Notes (Signed)
CSN: [id number]     Arrival date & time 10/09/14  [id number] History   First MD Initiated Contact with Patient 10/09/14 0830     Chief Complaint  Patient presents with  . Headache     (Consider location/radiation/quality/duration/timing/severity/associated sxs/prior Treatment) HPI Comments: Patient is a 59P4 female, currently 15w6, US confirmed Single IUP, presents emergency room chief complaint of daily headache over the last 2-3 weeks. The patient reports waking up with headache today at 0500. She reports posterior headache and frontal headache worsened with stress and "fussin at my kids". Partially relieved with lying down and sleeping.  Patient reports intermittent nausea, resolved. She also reports intermittently seeing spots, resolved no visual disturbances at this time. Patient denies treatment prior to arrival over the last 2-3 weeks. Denies numbness, tingling. Patient missed her OB appointment on 10/21, no prenatal care.  Reports noncompliance with prenatal vitamins due to causing constipation.  Patient is a 28 y.o. female presenting with headaches. The history is provided by the patient. No language interpreter was used.  Headache Associated symptoms: nausea   Associated symptoms: no abdominal pain, no diarrhea, no fever, no numbness, no photophobia and no vomiting     Past Medical History  Diagnosis Date  . Asthma   . Anemia   . Abnormal Pap smear   . Headache(784.0)   . Onset of menses     age of 89, regular, lasting 3 to 4 days, medium to heavy flow  . Gonorrhea   . Chlamydia   . Herpes     Last outbreak August 2014  . Anxiety   . Bipolar 1 disorder   . Pregnancy induced hypertension   . Preterm labor   . Infection     UTI  . Depression     doing well  . Vaginal Pap smear, abnormal     colpo, ok since   Past Surgical History  Procedure Laterality Date  . Colposcopy    . Vaginal delivery      X3  . Wisdom tooth extraction     Family History  Problem Relation  Age of Onset  . Arthritis    . Asthma    . Glaucoma    . Heart disease    . Hypertension    . Prostate cancer    . Cataracts    . COPD Father   . Prostate cancer Father   . Glaucoma Father   . Cataracts Father   . Hypertension Father   . Hypertension Maternal Grandmother   . Diabetes Maternal Grandmother    History  Substance Use Topics  . Smoking status: Former Smoker    Types: Cigarettes    Quit date: 12/20/2012  . Smokeless tobacco: Former NeurosurgeonUser     Comment: vapor-quit  . Alcohol Use: No   OB History    Gravida Para Term Preterm AB TAB SAB Ectopic Multiple Living   7 4 2 2 2  2   4      Review of Systems  Constitutional: Negative for fever and chills.  Eyes: Negative for photophobia and visual disturbance.  Gastrointestinal: Positive for nausea. Negative for vomiting, abdominal pain and diarrhea.  Genitourinary: Negative for dysuria, vaginal bleeding, vaginal discharge and pelvic pain.  Neurological: Positive for headaches. Negative for weakness and numbness.      Allergies  Review of patient's allergies indicates no known allergies.  Home Medications   Prior to Admission medications   Medication Sig Start Date End Date Taking? Authorizing Provider  metroNIDAZOLE (FLAGYL) 500 MG tablet Take 4 tablets (2,000 mg total) by mouth once. 08/18/14   Marny LowensteinJulie N Wenzel, PA-C   BP 106/67 mmHg  Pulse 54  Temp(Src) 97.8 F (36.6 C) (Oral)  Resp 16  SpO2 100%  LMP 06/16/2014 Physical Exam  Constitutional: She is oriented to person, place, and time. She appears well-developed and well-nourished. No distress.  HENT:  Head: Normocephalic and atraumatic.  Right Ear: External ear normal.  Left Ear: External ear normal.  Nose: Rhinorrhea present. Right sinus exhibits maxillary sinus tenderness and frontal sinus tenderness. Left sinus exhibits maxillary sinus tenderness and frontal sinus tenderness.  Mouth/Throat: Oropharynx is clear and moist.  Eyes: EOM are normal. Pupils  are equal, round, and reactive to light.  Neck: Normal range of motion. Neck supple.  Cardiovascular: Normal rate.   Pulmonary/Chest: Effort normal and breath sounds normal. No respiratory distress. She has no wheezes. She has no rales.  Abdominal: Soft. There is no tenderness.  Gravid uterus  Neurological: She is alert and oriented to person, place, and time.  Skin: Skin is warm and dry. No rash noted. She is not diaphoretic.  Psychiatric: She has a normal mood and affect. Her behavior is normal.  Nursing note and vitals reviewed.   ED Course  Procedures (including critical care time) Labs Review Labs Reviewed  URINALYSIS, ROUTINE W REFLEX MICROSCOPIC - Abnormal; Notable for the following:    APPearance TURBID (*)    Leukocytes, UA MODERATE (*)    All other components within normal limits  URINE MICROSCOPIC-ADD ON - Abnormal; Notable for the following:    Bacteria, UA FEW (*)    All other components within normal limits    Imaging Review No results found.   EKG Interpretation None      MDM   Final diagnoses:  Headache syndrome  History of pregnancy   Pregnant female presents with headache ongoing for several weeks, afebrile, no neurologic deficits on exam. Plan to treat with Tylenol and reevaluate, UA ordered. UA without signs of infection or protein. Re-eval pt resting comfortably in room. Reports moderate resolution of symptoms.  Discussed UA  Results with the patient.  Patient high-fived me after results.  Request to be discharged home.  Patient reports scheduling an appointment with OB 12/8 at 8:45 AM. Encouraged patient to keep scheduled appointment, take prenatal vitamins. Return precautions given. Reports understanding and no other concerns at this time.  Patient is stable for discharge at this time.   Mellody DrownLauren Nisreen Guise, PA-C 10/09/14 1520  Enid SkeensJoshua M Zavitz, MD 10/09/14 [id number]

## 2014-10-09 NOTE — Discharge Instructions (Signed)
[facility] for 10/29/2014. 8:45AM Call for a follow up appointment with a Family or Primary Care Provider.  Return if Symptoms worsen.   Take medication as prescribed.  Start taking your prenatal vitamins. Drink plenty of fluids.

## 2014-10-09 NOTE — ED Notes (Signed)
Bed: ZO10WA14 Expected date:  Expected time:  Means of arrival:  Comments: EMS [redacted] wk Pregnant, HA

## 2014-10-09 NOTE — ED Notes (Addendum)
Per EMS pt G7P4 [redacted] weeks pregnant c/o neck stiffness, "seeing floating black spots," headache for 3 weeks, worsening at 0600 today when she woke up, 1 episode of nausea at 0700 which has since resolved. Pt states she had pre-eclampsia with most recent pregnancy, was hospitalized. Pt states she has been smoking cigarettes until 3 weeks ago. Denies ETOH or other drug use. No prenatal care.

## 2014-10-29 ENCOUNTER — Other Ambulatory Visit: Payer: Self-pay | Admitting: Obstetrics & Gynecology

## 2014-10-29 ENCOUNTER — Encounter: Payer: Self-pay | Admitting: Obstetrics & Gynecology

## 2014-10-29 ENCOUNTER — Ambulatory Visit (INDEPENDENT_AMBULATORY_CARE_PROVIDER_SITE_OTHER): Payer: Medicaid Other | Admitting: Obstetrics & Gynecology

## 2014-10-29 VITALS — BP 111/69 | HR 53 | Wt 157.9 lb

## 2014-10-29 DIAGNOSIS — O0932 Supervision of pregnancy with insufficient antenatal care, second trimester: Secondary | ICD-10-CM

## 2014-10-29 LAB — POCT URINALYSIS DIP (DEVICE)
Bilirubin Urine: NEGATIVE
GLUCOSE, UA: NEGATIVE mg/dL
Ketones, ur: NEGATIVE mg/dL
NITRITE: NEGATIVE
Protein, ur: NEGATIVE mg/dL
Specific Gravity, Urine: >=1.03 (ref 1.005–1.030)
UROBILINOGEN UA: 0.2 mg/dL (ref 0.0–1.0)
pH: 6 (ref 5.0–8.0)

## 2014-10-29 NOTE — Patient Instructions (Signed)
Second Trimester of Pregnancy The second trimester is from week 13 through week 28, months 4 through 6. The second trimester is often a time when you feel your best. Your body has also adjusted to being pregnant, and you begin to feel better physically. Usually, morning sickness has lessened or quit completely, you may have more energy, and you may have an increase in appetite. The second trimester is also a time when the fetus is growing rapidly. At the end of the sixth month, the fetus is about 9 inches long and weighs about 1 pounds. You will likely begin to feel the baby move (quickening) between 18 and 20 weeks of the pregnancy. BODY CHANGES Your body goes through many changes during pregnancy. The changes vary from woman to woman.   Your weight will continue to increase. You will notice your lower abdomen bulging out.  You may begin to get stretch marks on your hips, abdomen, and breasts.  You may develop headaches that can be relieved by medicines approved by your health care provider.  You may urinate more often because the fetus is pressing on your bladder.  You may develop or continue to have heartburn as a result of your pregnancy.  You may develop constipation because certain hormones are causing the muscles that push waste through your intestines to slow down.  You may develop hemorrhoids or swollen, bulging veins (varicose veins).  You may have back pain because of the weight gain and pregnancy hormones relaxing your joints between the bones in your pelvis and as a result of a shift in weight and the muscles that support your balance.  Your breasts will continue to grow and be tender.  Your gums may bleed and may be sensitive to brushing and flossing.  Dark spots or blotches (chloasma, mask of pregnancy) may develop on your face. This will likely fade after the baby is born.  A dark line from your belly button to the pubic area (linea nigra) may appear. This will likely fade  after the baby is born.  You may have changes in your hair. These can include thickening of your hair, rapid growth, and changes in texture. Some women also have hair loss during or after pregnancy, or hair that feels dry or thin. Your hair will most likely return to normal after your baby is born. WHAT TO EXPECT AT YOUR PRENATAL VISITS During a routine prenatal visit:  You will be weighed to make sure you and the fetus are growing normally.  Your blood pressure will be taken.  Your abdomen will be measured to track your baby's growth.  The fetal heartbeat will be listened to.  Any test results from the previous visit will be discussed. Your health care provider may ask you:  How you are feeling.  If you are feeling the baby move.  If you have had any abnormal symptoms, such as leaking fluid, bleeding, severe headaches, or abdominal cramping.  If you have any questions. Other tests that may be performed during your second trimester include:  Blood tests that check for:  Low iron levels (anemia).  Gestational diabetes (between 24 and 28 weeks).  Rh antibodies.  Urine tests to check for infections, diabetes, or protein in the urine.  An ultrasound to confirm the proper growth and development of the baby.  An amniocentesis to check for possible genetic problems.  Fetal screens for spina bifida and Down syndrome. HOME CARE INSTRUCTIONS   Avoid all smoking, herbs, alcohol, and unprescribed   drugs. These chemicals affect the formation and growth of the baby.  Follow your health care provider's instructions regarding medicine use. There are medicines that are either safe or unsafe to take during pregnancy.  Exercise only as directed by your health care provider. Experiencing uterine cramps is a good sign to stop exercising.  Continue to eat regular, healthy meals.  Wear a good support bra for breast tenderness.  Do not use hot tubs, steam rooms, or saunas.  Wear your  seat belt at all times when driving.  Avoid raw meat, uncooked cheese, cat litter boxes, and soil used by cats. These carry germs that can cause birth defects in the baby.  Take your prenatal vitamins.  Try taking a stool softener (if your health care provider approves) if you develop constipation. Eat more high-fiber foods, such as fresh vegetables or fruit and whole grains. Drink plenty of fluids to keep your urine clear or pale yellow.  Take warm sitz baths to soothe any pain or discomfort caused by hemorrhoids. Use hemorrhoid cream if your health care provider approves.  If you develop varicose veins, wear support hose. Elevate your feet for 15 minutes, 3-4 times a day. Limit salt in your diet.  Avoid heavy lifting, wear low heel shoes, and practice good posture.  Rest with your legs elevated if you have leg cramps or low back pain.  Visit your dentist if you have not gone yet during your pregnancy. Use a soft toothbrush to brush your teeth and be gentle when you floss.  A sexual relationship may be continued unless your health care provider directs you otherwise.  Continue to go to all your prenatal visits as directed by your health care provider. SEEK MEDICAL CARE IF:   You have dizziness.  You have mild pelvic cramps, pelvic pressure, or nagging pain in the abdominal area.  You have persistent nausea, vomiting, or diarrhea.  You have a bad smelling vaginal discharge.  You have pain with urination. SEEK IMMEDIATE MEDICAL CARE IF:   You have a fever.  You are leaking fluid from your vagina.  You have spotting or bleeding from your vagina.  You have severe abdominal cramping or pain.  You have rapid weight gain or loss.  You have shortness of breath with chest pain.  You notice sudden or extreme swelling of your face, hands, ankles, feet, or legs.  You have not felt your baby move in over an hour.  You have severe headaches that do not go away with  medicine.  You have vision changes. Document Released: 11/02/2001 Document Revised: 11/13/2013 Document Reviewed: 01/09/2013 Exit[employer] Patient Information 2015 [employer]. This information is not intended to replace advice given to you by your health care provider. Make sure you discuss any questions you have with your health care provider.  

## 2014-10-29 NOTE — Progress Notes (Signed)
[name]'s last menstrual period was 06/16/2014. US at 6 weeks, previous patient of [facility] for NOB. Delivered 07/2013 and had preeclampsia postpartum. Wet prep sent. Pap 02/2013 normal  Subjective: NOB, was patient of [facility] for other pregnancies    [name] is a [id number] 11014w5d being seen today for her first obstetrical visit.  Her obstetrical history is significant for pre-eclampsia and previoius preterm birth. Patient does intend to breast feed. Pregnancy history fully reviewed.  Patient reports vaginal irritation.  Filed Vitals:   10/29/14 0844  BP: 111/69  Pulse: 53  Weight: 157 lb 14.4 oz (71.623 kg)    HISTORY: OB History  Gravida Para Term Preterm AB SAB TAB Ectopic Multiple Living  7 4 2 2 2 2    4     # Outcome Date GA Lbr Len/2nd Weight Sex Delivery Anes PTL Lv  7 Current           6 Term 08/27/13 5821w6d 11:13 / 00:18  M Vag-Spont EPI  Y     Comments: caput  5 Term 03/31/10 1763w0d  6 lb 1 oz (2.75 kg) M Vag-Spont   Y     Comments: No complications  4 Preterm 02/10/09 6636w0d  6 lb 5 oz (2.863 kg) M Tammy Cole  Y Y  3 SAB 2008          2 Preterm 04/29/99 2148w0d  4 lb 7 oz (2.013 kg) F Vag-Spont  Y Y  1 SAB              Past Medical History  Diagnosis Date  . Asthma   . Anemia   . Abnormal Pap smear   . Headache(784.0)   . Onset of menses     age of 89, regular, lasting 3 to 4 days, medium to heavy flow  . Gonorrhea   . Chlamydia   . Herpes     Last outbreak August 2014  . Anxiety   . Bipolar 1 disorder   . Pregnancy induced hypertension   . Preterm labor   . Infection     UTI  . Depression     doing well  . Vaginal Pap smear, abnormal     colpo, ok since  . Pre-eclampsia    Past Surgical History  Procedure Laterality Date  . Colposcopy    . Vaginal delivery      X3  . Wisdom tooth extraction     Family History  Problem Relation Age of Onset  . Arthritis    . Asthma    . Glaucoma    . Heart disease    . Hypertension    . Prostate cancer     . Cataracts    . COPD Father   . Prostate cancer Father   . Glaucoma Father   . Cataracts Father   . Hypertension Father   . Hypertension Maternal Grandmother   . Diabetes Maternal Grandmother      Exam    Uterus:     Pelvic Exam:    Perineum: No Hemorrhoids   Vulva: normal   Vagina:  thin grey discharge   pH:     Cervix: no lesions   Adnexa: not evaluated   Bony Pelvis: average  System: Breast:  normal appearance, no masses or tenderness   Skin: normal coloration and turgor, no rashes    Neurologic: oriented, oriented   Extremities: normal strength, tone, and muscle mass   HEENT neck supple with midline trachea   Mouth/Teeth  mucous membranes moist, pharynx normal without lesions and dental hygiene good   Neck supple   Cardiovascular: regular rate and rhythm   Respiratory:  appears well, vitals normal, no respiratory distress, acyanotic, normal RR, neck free of mass or lymphadenopathy, chest clear, no wheezing, crepitations, rhonchi, normal symmetric air entry   Abdomen: soft, non-tender; bowel sounds normal; no masses,  no organomegaly   Urinary: urethral meatus normal      Assessment:    Pregnancy: [id number] Patient Active Problem List   Diagnosis Date Noted  . Late prenatal care 10/29/2014  . Unspecified symptom associated with female genital organs 05/14/2013  . Urinary tract infection, site not specified 02/26/2013  . Vitamin D deficiency 02/26/2013  . Vaginitis and vulvovaginitis, unspecified 02/26/2013  . H/O herpes genitalis 01/09/2013  . History of depressed bipolar disorder 01/09/2013        Plan:     Initial labs drawn. Prenatal vitamins. Problem list reviewed and updated. Genetic Screening discussed Quad Screen: ordered.  Ultrasound discussed; fetal survey: ordered.  Follow up in 4 weeks.50% of 30 min visit spent on counseling and coordination of care.  Discussed history and risk factors for PTB. 17 P offered but she declines as her last 2  pregnancies were full term   Tammy Cole 10/29/2014

## 2014-10-29 NOTE — Progress Notes (Signed)
Patient thinks that she has BV, has thin discharge with irritation.

## 2014-10-30 LAB — PRENATAL PROFILE (SOLSTAS)
ANTIBODY SCREEN: NEGATIVE
BASOS ABS: 0 10*3/uL (ref 0.0–0.1)
BASOS PCT: 0 % (ref 0–1)
EOS PCT: 0 % (ref 0–5)
Eosinophils Absolute: 0 10*3/uL (ref 0.0–0.7)
HCT: 33.2 % — ABNORMAL LOW (ref 36.0–46.0)
HIV 1&2 Ab, 4th Generation: NONREACTIVE
Hemoglobin: 12 g/dL (ref 12.0–15.0)
Hepatitis B Surface Ag: NEGATIVE
Lymphocytes Relative: 33 % (ref 12–46)
Lymphs Abs: 1.7 10*3/uL (ref 0.7–4.0)
MCH: 31.7 pg (ref 26.0–34.0)
MCHC: 36.1 g/dL — ABNORMAL HIGH (ref 30.0–36.0)
MCV: 87.6 fL (ref 78.0–100.0)
MONO ABS: 0.5 10*3/uL (ref 0.1–1.0)
MONOS PCT: 10 % (ref 3–12)
MPV: 9.8 fL (ref 9.4–12.4)
Neutro Abs: 2.9 10*3/uL (ref 1.7–7.7)
Neutrophils Relative %: 57 % (ref 43–77)
PLATELETS: 164 10*3/uL (ref 150–400)
RBC: 3.79 MIL/uL — ABNORMAL LOW (ref 3.87–5.11)
RDW: 13.2 % (ref 11.5–15.5)
RH TYPE: POSITIVE
Rubella: 2.74 Index — ABNORMAL HIGH (ref <0.90)
WBC: 5 10*3/uL (ref 4.0–10.5)

## 2014-10-30 LAB — WET PREP, GENITAL
Clue Cells Wet Prep HPF POC: NONE SEEN
Trich, Wet Prep: NONE SEEN
WBC, Wet Prep HPF POC: NONE SEEN

## 2014-10-31 LAB — AFP, QUAD SCREEN
AFP: 51.6 ng/mL
Curr Gest Age: 18.5 wks.days
HCG, Total: 20.6 IU/mL
INH: 610.7 pg/mL
Interpretation-AFP: POSITIVE — AB
MOM FOR INH: 3.65
MoM for AFP: 0.97
MoM for hCG: 0.8
Open Spina bifida: NEGATIVE
Osb Risk: 1:29200 {titer}
Tri 18 Scr Risk Est: NEGATIVE
Trisomy 18 (Edward) Syndrome Interp.: 1:8870 {titer}
uE3 Mom: 0.64
uE3 Value: 0.93 ng/mL

## 2014-10-31 LAB — HEMOGLOBINOPATHY EVALUATION
HGB A2 QUANT: 2.9 % (ref 2.2–3.2)
HGB F QUANT: 0.3 % (ref 0.0–2.0)
HGB S QUANTITAION: 0 %
Hemoglobin Other: 0 %
Hgb A: 96.8 % (ref 96.8–97.8)

## 2014-10-31 LAB — GC/CHLAMYDIA PROBE AMP
CT PROBE, AMP APTIMA: NEGATIVE
GC PROBE AMP APTIMA: NEGATIVE

## 2014-10-31 LAB — CULTURE, OB URINE: Colony Count: 30000

## 2014-11-01 ENCOUNTER — Ambulatory Visit (HOSPITAL_COMMUNITY)
Admission: RE | Admit: 2014-11-01 | Discharge: 2014-11-01 | Disposition: A | Discharge status: Home / Self Care | Payer: Medicaid Other | Source: Ambulatory Visit | Attending: Obstetrics & Gynecology | Admitting: Obstetrics & Gynecology

## 2014-11-01 DIAGNOSIS — Z3A19 19 weeks gestation of pregnancy: Secondary | ICD-10-CM | POA: Diagnosis not present

## 2014-11-01 DIAGNOSIS — O0932 Supervision of pregnancy with insufficient antenatal care, second trimester: Secondary | ICD-10-CM

## 2014-11-01 DIAGNOSIS — Z36 Encounter for antenatal screening of mother: Secondary | ICD-10-CM | POA: Diagnosis present

## 2014-11-01 DIAGNOSIS — O09212 Supervision of pregnancy with history of pre-term labor, second trimester: Secondary | ICD-10-CM | POA: Diagnosis not present

## 2014-11-01 DIAGNOSIS — O289 Unspecified abnormal findings on antenatal screening of mother: Secondary | ICD-10-CM | POA: Diagnosis not present

## 2014-11-01 DIAGNOSIS — Z3689 Encounter for other specified antenatal screening: Secondary | ICD-10-CM | POA: Insufficient documentation

## 2014-11-04 LAB — CANNABANOIDS (GC/LC/MS), URINE: THC-COOH (GC/LC/MS), ur confirm: 601 ng/mL — AB (ref <5)

## 2014-11-05 LAB — PRESCRIPTION MONITORING PROFILE (19 PANEL)
Amphetamine/Meth: NEGATIVE ng/mL
BENZODIAZEPINE SCREEN, URINE: NEGATIVE ng/mL
Barbiturate Screen, Urine: NEGATIVE ng/mL
Buprenorphine, Urine: NEGATIVE ng/mL
Carisoprodol, Urine: NEGATIVE ng/mL
Cocaine Metabolites: NEGATIVE ng/mL
Creatinine, Urine: 204.52 mg/dL (ref >20.0)
FENTANYL URINE: NEGATIVE ng/mL
MDMA URINE: NEGATIVE ng/mL
MEPERIDINE UR: NEGATIVE ng/mL
Methadone Screen, Urine: NEGATIVE ng/mL
Methaqualone: NEGATIVE ng/mL
NITRITES URINE, INITIAL: NEGATIVE ug/mL
OPIATE SCREEN, URINE: NEGATIVE ng/mL
Oxycodone Screen, Ur: NEGATIVE ng/mL
PH URINE, INITIAL: 6.1 pH (ref 4.5–8.9)
PHENCYCLIDINE, UR: NEGATIVE ng/mL
PROPOXYPHENE: NEGATIVE ng/mL
TRAMADOL UR: NEGATIVE ng/mL
Tapentadol, urine: NEGATIVE ng/mL
Zolpidem, Urine: NEGATIVE ng/mL

## 2014-11-06 ENCOUNTER — Telehealth: Payer: Self-pay | Admitting: *Deleted

## 2014-11-06 NOTE — Telephone Encounter (Signed)
Patient called and stated that she has been having a severe headache since last night. It was made worse by lights and movement. Patient reports headache is still severe this morning so bad that she is crying. I advised that she come to MAU for evaluation. Patient is agreeable to this. She is also concerned about her blood pressure, I advised that she inform staff at MAU about this concern as well. Patient agrees.

## 2014-11-18 ENCOUNTER — Encounter: Payer: Self-pay | Admitting: *Deleted

## 2014-11-19 ENCOUNTER — Encounter: Payer: Self-pay | Admitting: Obstetrics & Gynecology

## 2014-11-26 ENCOUNTER — Encounter: Payer: Self-pay | Admitting: Advanced Practice Midwife

## 2014-11-26 ENCOUNTER — Ambulatory Visit (INDEPENDENT_AMBULATORY_CARE_PROVIDER_SITE_OTHER): Payer: Medicaid Other | Admitting: Advanced Practice Midwife

## 2014-11-26 ENCOUNTER — Encounter: Payer: Self-pay | Admitting: Obstetrics & Gynecology

## 2014-11-26 VITALS — BP 101/64 | HR 50 | Temp 98.2°F | Wt 158.7 lb

## 2014-11-26 DIAGNOSIS — O09212 Supervision of pregnancy with history of pre-term labor, second trimester: Secondary | ICD-10-CM

## 2014-11-26 DIAGNOSIS — O0930 Supervision of pregnancy with insufficient antenatal care, unspecified trimester: Secondary | ICD-10-CM

## 2014-11-26 DIAGNOSIS — O289 Unspecified abnormal findings on antenatal screening of mother: Secondary | ICD-10-CM

## 2014-11-26 DIAGNOSIS — O0932 Supervision of pregnancy with insufficient antenatal care, second trimester: Secondary | ICD-10-CM

## 2014-11-26 DIAGNOSIS — O139 Gestational [pregnancy-induced] hypertension without significant proteinuria, unspecified trimester: Secondary | ICD-10-CM

## 2014-11-26 DIAGNOSIS — O280 Abnormal hematological finding on antenatal screening of mother: Secondary | ICD-10-CM

## 2014-11-26 LAB — POCT URINALYSIS DIP (DEVICE)
BILIRUBIN URINE: NEGATIVE
GLUCOSE, UA: NEGATIVE mg/dL
Ketones, ur: 15 mg/dL — AB
Nitrite: NEGATIVE
Protein, ur: NEGATIVE mg/dL
Specific Gravity, Urine: 1.025 (ref 1.005–1.030)
Urobilinogen, UA: 0.2 mg/dL (ref 0.0–1.0)
pH: 6 (ref 5.0–8.0)

## 2014-11-26 NOTE — Progress Notes (Signed)
C/o of infrequent contractions, yet states at times they can be painful. C/o of frequent headaches.

## 2014-11-26 NOTE — Patient Instructions (Signed)
Second Trimester of Pregnancy The second trimester is from week 13 through week 28, months 4 through 6. The second trimester is often a time when you feel your best. Your body has also adjusted to being pregnant, and you begin to feel better physically. Usually, morning sickness has lessened or quit completely, you may have more energy, and you may have an increase in appetite. The second trimester is also a time when the fetus is growing rapidly. At the end of the sixth month, the fetus is about 9 inches long and weighs about 1 pounds. You will likely begin to feel the baby move (quickening) between 18 and 20 weeks of the pregnancy. BODY CHANGES Your body goes through many changes during pregnancy. The changes vary from woman to woman.   Your weight will continue to increase. You will notice your lower abdomen bulging out.  You may begin to get stretch marks on your hips, abdomen, and breasts.  You may develop headaches that can be relieved by medicines approved by your health care provider.  You may urinate more often because the fetus is pressing on your bladder.  You may develop or continue to have heartburn as a result of your pregnancy.  You may develop constipation because certain hormones are causing the muscles that push waste through your intestines to slow down.  You may develop hemorrhoids or swollen, bulging veins (varicose veins).  You may have back pain because of the weight gain and pregnancy hormones relaxing your joints between the bones in your pelvis and as a result of a shift in weight and the muscles that support your balance.  Your breasts will continue to grow and be tender.  Your gums may bleed and may be sensitive to brushing and flossing.  Dark spots or blotches (chloasma, mask of pregnancy) may develop on your face. This will likely fade after the baby is born.  A dark line from your belly button to the pubic area (linea nigra) may appear. This will likely fade  after the baby is born.  You may have changes in your hair. These can include thickening of your hair, rapid growth, and changes in texture. Some women also have hair loss during or after pregnancy, or hair that feels dry or thin. Your hair will most likely return to normal after your baby is born. WHAT TO EXPECT AT YOUR PRENATAL VISITS During a routine prenatal visit:  You will be weighed to make sure you and the fetus are growing normally.  Your blood pressure will be taken.  Your abdomen will be measured to track your baby's growth.  The fetal heartbeat will be listened to.  Any test results from the previous visit will be discussed. Your health care provider may ask you:  How you are feeling.  If you are feeling the baby move.  If you have had any abnormal symptoms, such as leaking fluid, bleeding, severe headaches, or abdominal cramping.  If you have any questions. Other tests that may be performed during your second trimester include:  Blood tests that check for:  Low iron levels (anemia).  Gestational diabetes (between 24 and 28 weeks).  Rh antibodies.  Urine tests to check for infections, diabetes, or protein in the urine.  An ultrasound to confirm the proper growth and development of the baby.  An amniocentesis to check for possible genetic problems.  Fetal screens for spina bifida and Down syndrome. HOME CARE INSTRUCTIONS   Avoid all smoking, herbs, alcohol, and unprescribed   drugs. These chemicals affect the formation and growth of the baby.  Follow your health care provider's instructions regarding medicine use. There are medicines that are either safe or unsafe to take during pregnancy.  Exercise only as directed by your health care provider. Experiencing uterine cramps is a good sign to stop exercising.  Continue to eat regular, healthy meals.  Wear a good support bra for breast tenderness.  Do not use hot tubs, steam rooms, or saunas.  Wear your  seat belt at all times when driving.  Avoid raw meat, uncooked cheese, cat litter boxes, and soil used by cats. These carry germs that can cause birth defects in the baby.  Take your prenatal vitamins.  Try taking a stool softener (if your health care provider approves) if you develop constipation. Eat more high-fiber foods, such as fresh vegetables or fruit and whole grains. Drink plenty of fluids to keep your urine clear or pale yellow.  Take warm sitz baths to soothe any pain or discomfort caused by hemorrhoids. Use hemorrhoid cream if your health care provider approves.  If you develop varicose veins, wear support hose. Elevate your feet for 15 minutes, 3-4 times a day. Limit salt in your diet.  Avoid heavy lifting, wear low heel shoes, and practice good posture.  Rest with your legs elevated if you have leg cramps or low back pain.  Visit your dentist if you have not gone yet during your pregnancy. Use a soft toothbrush to brush your teeth and be gentle when you floss.  A sexual relationship may be continued unless your health care provider directs you otherwise.  Continue to go to all your prenatal visits as directed by your health care provider. SEEK MEDICAL CARE IF:   You have dizziness.  You have mild pelvic cramps, pelvic pressure, or nagging pain in the abdominal area.  You have persistent nausea, vomiting, or diarrhea.  You have a bad smelling vaginal discharge.  You have pain with urination. SEEK IMMEDIATE MEDICAL CARE IF:   You have a fever.  You are leaking fluid from your vagina.  You have spotting or bleeding from your vagina.  You have severe abdominal cramping or pain.  You have rapid weight gain or loss.  You have shortness of breath with chest pain.  You notice sudden or extreme swelling of your face, hands, ankles, feet, or legs.  You have not felt your baby move in over an hour.  You have severe headaches that do not go away with  medicine.  You have vision changes. Document Released: 11/02/2001 Document Revised: 11/13/2013 Document Reviewed: 01/09/2013 Exit[employer] Patient Information 2015 [employer]. This information is not intended to replace advice given to you by your health care provider. Make sure you discuss any questions you have with your health care provider.  

## 2014-11-26 NOTE — Progress Notes (Signed)
Doing well. Will schedule f/u US. Needs it for views of heart, cervical length (they recommend serial), and growth. Also needs genetic counseling. Will schedule. Asking about contraception. Wanted nexplanon, but does not want bleeding issues. Info sheets on options given. Discussed BTL. Wanted it last time "but I got scared".  Discussed needs to sign papers if wants

## 2014-11-29 ENCOUNTER — Encounter: Payer: Self-pay | Admitting: *Deleted

## 2014-12-03 ENCOUNTER — Other Ambulatory Visit (HOSPITAL_COMMUNITY): Payer: Self-pay | Admitting: Obstetrics and Gynecology

## 2014-12-03 ENCOUNTER — Ambulatory Visit (HOSPITAL_COMMUNITY)
Admission: RE | Admit: 2014-12-03 | Discharge: 2014-12-03 | Disposition: A | Discharge status: Home / Self Care | Payer: Medicaid Other | Source: Ambulatory Visit | Attending: Advanced Practice Midwife | Admitting: Advanced Practice Midwife

## 2014-12-03 DIAGNOSIS — O289 Unspecified abnormal findings on antenatal screening of mother: Secondary | ICD-10-CM | POA: Diagnosis present

## 2014-12-03 DIAGNOSIS — Z3A23 23 weeks gestation of pregnancy: Secondary | ICD-10-CM

## 2014-12-03 DIAGNOSIS — O280 Abnormal hematological finding on antenatal screening of mother: Secondary | ICD-10-CM

## 2014-12-03 NOTE — Progress Notes (Signed)
Genetic Counseling  High-Risk Gestation Note  Appointment Date:  12/03/2014 Referred By: Aviva SignsWilliams, Marie L, CNM Date of Birth:  1986/01/28    Pregnancy History: [id number] Estimated Date of Delivery: 03/27/15 Estimated Gestational Age: 758w5d Attending: Damaris Hippoenney, Jeffrey, MD   [name] was seen for genetic counseling because of an increased risk for fetal Down syndrome based on maternal serum Quad screening through [facility].  She was counseled regarding the Quad screen result and the associated 1 in 92 risk for fetal Down syndrome.  We reviewed chromosomes, nondisjunction, and the common features and variable prognosis of Down syndrome.  In addition, we reviewed the screen adjusted reduction in risks for trisomy 18 and ONTDs.  We also discussed other explanations for a screen positive result including: a gestational dating error, differences in maternal metabolism, and normal variation. They understand that this screening is not diagnostic for Down syndrome but provides a risk assessment.  We reviewed available screening options including noninvasive prenatal screening (NIPS)/cell free fetal DNA (cffDNA) testing, and detailed ultrasound.  She was counseled that screening tests are used to modify a patient's a priori risk for aneuploidy, typically based on age. This estimate provides a pregnancy specific risk assessment. We reviewed the benefits and limitations of each option. Specifically, we discussed the conditions for which each test screens, the detection rates, and false positive rates of each. She was also counseled regarding diagnostic testing via amniocentesis. We reviewed the approximate 1 in 300-500 risk for complications for amniocentesis, including spontaneous pregnancy loss.   After consideration of all the options, she elected to proceed with NIPS.  Those results will be available in 8-10 days.  The patient also expressed interest in having a detailed ultrasound.  A  complete ultrasound was performed today. The ultrasound report will be documented separately. There were no visualized fetal anomalies or markers suggestive of aneuploidy. Amniocentesis was declined today.  She understands that screening tests cannot rule out all birth defects or genetic syndromes. The patient was advised of this limitation and states she still does not want additional testing at this time.   [name] was provided with written information regarding sickle cell anemia (SCA) including the carrier frequency and incidence in the African-American population, the availability of carrier testing and prenatal diagnosis if indicated.  In addition, we discussed that hemoglobinopathies are routinely screened for as part of the [place] newborn screening panel.  [name] had hemoglobin electrophoresis performed previously.  We reviewed the normal results today.   Both family histories were reviewed and found to be noncontributory for birth defects, intellectual disability, and known genetic conditions. Without further information regarding the provided family history, an accurate genetic risk cannot be calculated. Further genetic counseling is warranted if more information is obtained.  [name] denied exposure to environmental toxins or chemical agents. She denied the use of alcohol and tobacco. She denied significant viral illnesses during the course of her pregnancy.   I counseled Tammy Cole for approximately 38 minutes regarding the above risks and available options.   Despina Ariashristy Jisselle Poth, MS  Certified Genetic Counselor

## 2014-12-03 NOTE — Addendum Note (Signed)
Encounter addended by: Durwin NoraJeffrey M Denney, MD on: 12/03/2014  1:47 PM<BR>     Documentation filed: Charges VN

## 2014-12-10 ENCOUNTER — Other Ambulatory Visit (HOSPITAL_COMMUNITY): Payer: Self-pay

## 2014-12-10 ENCOUNTER — Telehealth (HOSPITAL_COMMUNITY): Payer: Self-pay | Admitting: MS"

## 2014-12-10 NOTE — Telephone Encounter (Signed)
Called Sharlot Gowdaeira F Fesler to discuss her cell free fetal DNA test results.  Ms. Sharlot Gowdaeira F Game had Panorama testing through [facility].  Testing was offered because of Down syndrome risk from Quad screen.  The patient was identified by name and DOB.  We reviewed that these are within normal limits, showing a less than 1 in 10,000 risk for trisomies 21, 18 and 13, and monosomy X (Turner syndrome).  In addition, the risk for triploidy/vanishing twin and sex chromosome trisomies (47,XXX and 47,XXY) was also low risk.  We reviewed that this testing identifies > 99% of pregnancies with trisomy 9221, trisomy 7413, sex chromosome trisomies (47,XXX and 47,XXY), and triploidy. The detection rate for trisomy 18 is 96%.  The detection rate for monosomy X is ~92%.  The false positive rate is <0.1% for all conditions. Testing was also consistent with female fetal sex.  She understands that this testing does not identify all genetic conditions.    Ms. Davene CostainBullock asked if her blood levels were still elevated for possible preeclampsia and placental dysfunction. Discussed that the level of the protein from the Quad screen [facility](DIA) that was elevated was not assessed by Panorama and that she would still be followed given the previous elevated DIA. All questions were answered to her satisfaction, she was encouraged to call with additional questions or concerns.  Quinn PlowmanKaren Yanci Bachtell, MS Patent attorneyCertified Genetic Counselor

## 2014-12-24 ENCOUNTER — Ambulatory Visit (INDEPENDENT_AMBULATORY_CARE_PROVIDER_SITE_OTHER): Payer: Medicaid Other | Admitting: Advanced Practice Midwife

## 2014-12-24 VITALS — BP 103/65 | HR 65 | Temp 98.5°F | Wt 162.7 lb

## 2014-12-24 DIAGNOSIS — L299 Pruritus, unspecified: Secondary | ICD-10-CM

## 2014-12-24 DIAGNOSIS — Z3492 Encounter for supervision of normal pregnancy, unspecified, second trimester: Secondary | ICD-10-CM

## 2014-12-24 DIAGNOSIS — Z23 Encounter for immunization: Secondary | ICD-10-CM

## 2014-12-24 LAB — CBC
HCT: 33.4 % — ABNORMAL LOW (ref 36.0–46.0)
HEMOGLOBIN: 11.3 g/dL — AB (ref 12.0–15.0)
MCH: 31.4 pg (ref 26.0–34.0)
MCHC: 33.8 g/dL (ref 30.0–36.0)
MCV: 92.8 fL (ref 78.0–100.0)
MPV: 10 fL (ref 8.6–12.4)
Platelets: 155 10*3/uL (ref 150–400)
RBC: 3.6 MIL/uL — ABNORMAL LOW (ref 3.87–5.11)
RDW: 12.9 % (ref 11.5–15.5)
WBC: 6 10*3/uL (ref 4.0–10.5)

## 2014-12-24 LAB — POCT URINALYSIS DIP (DEVICE)
BILIRUBIN URINE: NEGATIVE
GLUCOSE, UA: NEGATIVE mg/dL
Ketones, ur: NEGATIVE mg/dL
Nitrite: NEGATIVE
PROTEIN: NEGATIVE mg/dL
Specific Gravity, Urine: 1.025 (ref 1.005–1.030)
Urobilinogen, UA: 0.2 mg/dL (ref 0.0–1.0)
pH: 7 (ref 5.0–8.0)

## 2014-12-24 MED ORDER — HYDROXYZINE PAMOATE 25 MG PO CAPS: 25.0000 mg | ORAL_CAPSULE | Freq: Three times a day (TID) | ORAL | Status: DC | PRN

## 2014-12-24 MED ORDER — TETANUS-DIPHTH-ACELL PERTUSSIS 5-2.5-18.5 LF-MCG/0.5 IM SUSP
0.5000 mL | Freq: Once | INTRAMUSCULAR | Status: AC
  Administered 2014-12-24: 0.5 mL via INTRAMUSCULAR

## 2014-12-24 NOTE — Progress Notes (Signed)
Doing well.  Good fetal movement, denies vaginal bleeding, LOF, regular contractions.  Leukocytes and trace blood in urine--sent for culture. Generalized itching without visible rash.  Itching started 2-3 weeks ago.  No one else in household itching, no change to soap/detergent.  Bile acids drawn today. Vistaril 25 mg PO TID PRN.  28 week labs today.

## 2014-12-24 NOTE — Progress Notes (Signed)
Pt complains of itching all over body for the past 3 weeks 28 wk labs with 1 hour glucose, Tdap vaccine requested

## 2014-12-25 ENCOUNTER — Telehealth: Payer: Self-pay | Admitting: Advanced Practice Midwife

## 2014-12-25 ENCOUNTER — Encounter: Payer: Self-pay | Admitting: Advanced Practice Midwife

## 2014-12-25 LAB — HIV ANTIBODY (ROUTINE TESTING W REFLEX): HIV 1&2 Ab, 4th Generation: NONREACTIVE

## 2014-12-25 LAB — GLUCOSE TOLERANCE, 1 HOUR (50G) W/O FASTING: GLUCOSE 1 HOUR GTT: 45 mg/dL — AB (ref 70–140)

## 2014-12-25 LAB — RPR

## 2014-12-25 NOTE — Telephone Encounter (Signed)
Returned pt's call. Pt is [redacted] weeks pregnant. Son Dx'd w/ Hand, foot, mouth Dz today. Pt denies fever, rash. Asking if this is dangerous in pregnancy and if she needs to do anything about it. Per UpToDate and CDC web site minimal risk at [redacted] weeks gestation. (Primary concern is SAB or neonatal infection if acquired close to time of delivery-rare.) Per consult w/ Dr. Penne LashLeggett will check antibodies to see if pt is immune or newly exposed. Will determine F/U based on results. Pt will get Coxsackie A and B IgG and IgM tomorrow at [facility]. Strongly encouraged excellent hand-washing.

## 2014-12-26 LAB — CULTURE, OB URINE
COLONY COUNT: NO GROWTH
Organism ID, Bacteria: NO GROWTH

## 2014-12-26 LAB — BILE ACIDS, TOTAL: BILE ACIDS TOTAL: 5 umol/L (ref 0–19)

## 2014-12-27 ENCOUNTER — Telehealth: Payer: Self-pay | Admitting: *Deleted

## 2014-12-27 NOTE — Telephone Encounter (Signed)
Pt left message requesting her test results from last visit. She is concerned about the bile acids and diabetes.  I returned her call and stated that she does not have gestational diabetes and her bile acids were normal. Test results were wnl - actually her sugar test result was a little low.  Pt remarked that she did not feel well tat Tammy Cole when she arrived home - she was shaky and nauseous.  She feels better now. I stated that this was most likely from the low blood sugar. Pt then stated that she always gets a lot of pelvic pain late in pregnancy and wants to know if she can be induced @ [redacted] wks gestation.  I told her that would not be possible for the reason of pelvic pain. induction @ 37 wks would only occur if she or the baby was having a medical complication.  Pt voiced understanding of all information.

## 2015-01-07 ENCOUNTER — Encounter: Payer: Self-pay | Admitting: Physician Assistant

## 2015-01-07 ENCOUNTER — Ambulatory Visit (INDEPENDENT_AMBULATORY_CARE_PROVIDER_SITE_OTHER): Payer: Medicaid Other | Admitting: Physician Assistant

## 2015-01-07 VITALS — BP 105/51 | HR 76 | Temp 98.1°F | Wt 167.2 lb

## 2015-01-07 DIAGNOSIS — Z3493 Encounter for supervision of normal pregnancy, unspecified, third trimester: Secondary | ICD-10-CM

## 2015-01-07 DIAGNOSIS — O0933 Supervision of pregnancy with insufficient antenatal care, third trimester: Secondary | ICD-10-CM

## 2015-01-07 LAB — POCT URINALYSIS DIP (DEVICE)
BILIRUBIN URINE: NEGATIVE
Glucose, UA: NEGATIVE mg/dL
Hgb urine dipstick: NEGATIVE
KETONES UR: NEGATIVE mg/dL
NITRITE: NEGATIVE
PH: 7 (ref 5.0–8.0)
Protein, ur: NEGATIVE mg/dL
Specific Gravity, Urine: 1.015 (ref 1.005–1.030)
Urobilinogen, UA: 1 mg/dL (ref 0.0–1.0)

## 2015-01-07 NOTE — Patient Instructions (Signed)

## 2015-01-07 NOTE — Progress Notes (Signed)
28 weeks, denies LOF, vag bleeding, dysuria.  Endorses good fetal movement.  Does complain of intermittent strong ctx.   PNV qd  PTL precautions Pt desiring BTL.  Extensive discussion.  Pt states 5 kids is enough and her body is not holding up well to additional pregnancies.  She is advised the surgery is permanent and there would be no further pregnancies.  She is to put more thought into this decision and then sign consent at next visit if still desired.   RTC 2 weeks

## 2015-01-14 ENCOUNTER — Other Ambulatory Visit (HOSPITAL_COMMUNITY): Payer: Self-pay | Admitting: Obstetrics and Gynecology

## 2015-01-14 ENCOUNTER — Other Ambulatory Visit (HOSPITAL_COMMUNITY): Payer: Self-pay | Admitting: Maternal and Fetal Medicine

## 2015-01-14 ENCOUNTER — Ambulatory Visit (HOSPITAL_COMMUNITY)
Admission: RE | Admit: 2015-01-14 | Discharge: 2015-01-14 | Disposition: A | Discharge status: Home / Self Care | Payer: Medicaid Other | Source: Ambulatory Visit | Attending: Advanced Practice Midwife | Admitting: Advanced Practice Midwife

## 2015-01-14 ENCOUNTER — Encounter (HOSPITAL_COMMUNITY): Payer: Self-pay

## 2015-01-14 DIAGNOSIS — O280 Abnormal hematological finding on antenatal screening of mother: Secondary | ICD-10-CM

## 2015-01-14 DIAGNOSIS — O09293 Supervision of pregnancy with other poor reproductive or obstetric history, third trimester: Secondary | ICD-10-CM | POA: Insufficient documentation

## 2015-01-14 DIAGNOSIS — Z8751 Personal history of pre-term labor: Secondary | ICD-10-CM

## 2015-01-14 DIAGNOSIS — O09899 Supervision of other high risk pregnancies, unspecified trimester: Secondary | ICD-10-CM | POA: Insufficient documentation

## 2015-01-14 DIAGNOSIS — O288 Other abnormal findings on antenatal screening of mother: Secondary | ICD-10-CM

## 2015-01-14 DIAGNOSIS — O283 Abnormal ultrasonic finding on antenatal screening of mother: Secondary | ICD-10-CM | POA: Diagnosis not present

## 2015-01-14 DIAGNOSIS — Z3A29 29 weeks gestation of pregnancy: Secondary | ICD-10-CM | POA: Insufficient documentation

## 2015-01-19 ENCOUNTER — Encounter (HOSPITAL_COMMUNITY): Payer: Self-pay | Admitting: *Deleted

## 2015-01-19 ENCOUNTER — Inpatient Hospital Stay (HOSPITAL_COMMUNITY)
Admission: AD | Admit: 2015-01-19 | Discharge: 2015-01-19 | Disposition: A | Discharge status: Home / Self Care | Payer: Medicaid Other | Source: Ambulatory Visit | Attending: Obstetrics & Gynecology | Admitting: Obstetrics & Gynecology

## 2015-01-19 DIAGNOSIS — O9989 Other specified diseases and conditions complicating pregnancy, childbirth and the puerperium: Secondary | ICD-10-CM | POA: Diagnosis not present

## 2015-01-19 DIAGNOSIS — Z87891 Personal history of nicotine dependence: Secondary | ICD-10-CM | POA: Insufficient documentation

## 2015-01-19 DIAGNOSIS — Z3A31 31 weeks gestation of pregnancy: Secondary | ICD-10-CM | POA: Diagnosis not present

## 2015-01-19 DIAGNOSIS — N949 Unspecified condition associated with female genital organs and menstrual cycle: Secondary | ICD-10-CM | POA: Diagnosis present

## 2015-01-19 DIAGNOSIS — O26893 Other specified pregnancy related conditions, third trimester: Secondary | ICD-10-CM

## 2015-01-19 DIAGNOSIS — O365930 Maternal care for other known or suspected poor fetal growth, third trimester, not applicable or unspecified: Secondary | ICD-10-CM | POA: Diagnosis not present

## 2015-01-19 DIAGNOSIS — R102 Pelvic and perineal pain: Secondary | ICD-10-CM

## 2015-01-19 NOTE — MAU Provider Note (Signed)
S: Pt states pain has been going on for a while. Pulling type pain in low abd and back. Better when at rest. O: SVE cl/th/post/high. FHR pattern reassurring. No contractons. VSS. A: common discomfort of pregnancy P: tylenol, benadryl, maternity girdle, heating pad on low.

## 2015-01-19 NOTE — MAU Note (Signed)
Pt presents to MAU with complaints of pelvic pain. Denies any vaginal bleeding or LOF. Pt states that she a pregnancy belt but it does not help at all

## 2015-01-19 NOTE — MAU Provider Note (Signed)
History  Chief Complaint:  Pelvic Pain  Tammy Cole is a 29 y.o. [phone] female at 7964w0d presenting with complaint of pelvic pain an pressure, unrelieved by wearing her maternity belt.   Reports active fetal movement, contractions: none, vaginal bleeding: none, membranes: intact. Denies uti s/s, abnormal/malodorous vag d/c or vulvovaginal itching/irritation.   [facility]  Next visit 01/21/2015. Pregnancy complicated by poor fetal growth, abnormal quad screen, high inhibin, & bipolar disorder.  Obstetrical History: OB History    Gravida Para Term Preterm AB TAB SAB Ectopic Multiple Living   7 4 2 2 2  2   4       Past Medical History: Past Medical History  Diagnosis Date  . Asthma   . Anemia   . Abnormal Pap smear   . Headache(784.0)   . Onset of menses     age of 89, regular, lasting 3 to 4 days, medium to heavy flow  . Gonorrhea   . Chlamydia   . Herpes     Last outbreak August 2014  . Anxiety   . Bipolar 1 disorder   . Pregnancy induced hypertension   . Preterm labor   . Infection     UTI  . Depression     doing well  . Vaginal Pap smear, abnormal     colpo, ok since  . Pre-eclampsia     Past Surgical History: Past Surgical History  Procedure Laterality Date  . Colposcopy    . Vaginal delivery      X3  . Wisdom tooth extraction      Social History: History   Social History  . Marital Status: Single    Spouse Name: N/A  . Number of Children: N/A  . Years of Education: N/A   Occupational History  . Disabled    Social History Main Topics  . Smoking status: Former Smoker    Types: Cigarettes    Quit date: 12/20/2012  . Smokeless tobacco: Former NeurosurgeonUser     Comment: vapor-quit  . Alcohol Use: No  . Drug Use: No  . Sexual Activity: Yes    Birth Control/ Protection: None     Comment: No intercourse since + UPT   Other Topics Concern  . None   Social History Narrative    Allergies: No Known Allergies  Prescriptions prior to  admission  Medication Sig Dispense Refill Last Dose  . Prenatal Vit-Min-FA-Fish Oil ([facility] PRENATAL GUMMY PO) Take 2 each by mouth daily.   01/18/2015 at Unknown time  . hydrOXYzine (VISTARIL) 25 MG capsule Take 1 capsule (25 mg total) by mouth 3 (three) times daily as needed. (Patient not taking: Reported on 01/07/2015) 30 capsule 2 Not Taking  . metroNIDAZOLE (FLAGYL) 500 MG tablet Take 4 tablets (2,000 mg total) by mouth once. (Patient not taking: Reported on 10/10/2014) 4 tablet 0 Not Taking  . Prenatal Vit-Fe Fumarate-FA (PRENATAL COMPLETE) 14-0.4 MG TABS Take 1 tablet by mouth daily. (Patient not taking: Reported on 10/29/2014) 30 each 0 Not Taking    Review of Systems  Pertinent pos/neg as indicated in HPI  Physical Exam  Blood pressure 114/73, pulse 56, temperature 97.6 F (36.4 C), resp. rate 18, last menstrual period 06/16/2014, unknown if currently breastfeeding. General appearance: alert, cooperative and mild distress Lungs: normal effort Heart: regular rate and rhythm Abdomen: gravid, soft, non-tender Extremities: no edema  Spec exam: deferred Cultures/Specimens: none    Presentation: unsure  Fetal monitoring: FHR: 140 bpm, variability: moderate,  Accelerations: Present,  decelerations:  Absent Uterine activity: no contractions  MAU Course  NST reactive, no decels or contractions noted.  SVE closed.  Labs:  No results found for this or any previous visit (from the past 24 hour(s)).  Imaging:  n/a  Assessment and Plan  A:  [redacted]w[redacted]d SIUP  [id number]  Normal discomforts of third trimester  Cat 1 FHR P:  Discharge  Reviewed s/s preterm labor vs normal discomforts of pregnancy  Keep next appt at [facility] on 01/21/2015 as scheduled   Jessaca Philippi Wynne SNM 2/28/201610:25 AM

## 2015-01-19 NOTE — Discharge Instructions (Signed)
Abdominal Pain During Pregnancy °Belly (abdominal) pain is common during pregnancy. Most of the time, it is not a serious problem. Other times, it can be a sign that something is wrong with the pregnancy. Always tell your doctor if you have belly pain. °HOME CARE °Monitor your belly pain for any changes. The following actions may help you feel better: °· Do not have sex (intercourse) or put anything in your vagina until you feel better. °· Rest until your pain stops. °· Drink clear fluids if you feel sick to your stomach (nauseous). Do not eat solid food until you feel better. °· Only take medicine as told by your doctor. °· Keep all doctor visits as told. °GET HELP RIGHT AWAY IF:  °· You are bleeding, leaking fluid, or pieces of tissue come out of your vagina. °· You have more pain or cramping. °· You keep throwing up (vomiting). °· You have pain when you pee (urinate) or have blood in your pee. °· You have a fever. °· You do not feel your baby moving as much. °· You feel very weak or feel like passing out. °· You have trouble breathing, with or without belly pain. °· You have a very bad headache and belly pain. °· You have fluid leaking from your vagina and belly pain. °· You keep having watery poop (diarrhea). °· Your belly pain does not go away after resting, or the pain gets worse. °MAKE SURE YOU:  °· Understand these instructions. °· Will watch your condition. °· Will get help right away if you are not doing well or get worse. °Document Released: 10/27/2009 Document Revised: 07/11/2013 Document Reviewed: 06/07/2013 °ExitCare® Patient Information ©2015 [employer] LLC. This information is not intended to replace advice given to you by your health care provider. Make sure you discuss any questions you have with your health care provider. ° °

## 2015-01-21 ENCOUNTER — Ambulatory Visit (INDEPENDENT_AMBULATORY_CARE_PROVIDER_SITE_OTHER): Payer: Medicare Other | Admitting: Physician Assistant

## 2015-01-21 VITALS — BP 105/69 | HR 58 | Temp 98.8°F | Wt 166.0 lb

## 2015-01-21 DIAGNOSIS — O0933 Supervision of pregnancy with insufficient antenatal care, third trimester: Secondary | ICD-10-CM

## 2015-01-21 LAB — POCT URINALYSIS DIP (DEVICE)
Bilirubin Urine: NEGATIVE
Glucose, UA: NEGATIVE mg/dL
Ketones, ur: NEGATIVE mg/dL
Nitrite: NEGATIVE
PROTEIN: NEGATIVE mg/dL
Specific Gravity, Urine: 1.025 (ref 1.005–1.030)
Urobilinogen, UA: 0.2 mg/dL (ref 0.0–1.0)
pH: 6 (ref 5.0–8.0)

## 2015-01-21 NOTE — Progress Notes (Signed)
Pt reports going to MAU for pelvic pain on 01/19/15 Edema- hands/feet Benadryl is not effective for itching

## 2015-01-21 NOTE — Progress Notes (Signed)
BTS paperwork filled out

## 2015-01-21 NOTE — Patient Instructions (Signed)
Postpartum Tubal Ligation A postpartum tubal ligation (PPTL) is a procedure that blocks the fallopian tubes right after childbirth or 1-2 days after childbirth. PPTL is done before the uterus returns to its normal location. The procedure is also called a minilaparotomy. By blocking the fallopian tubes, the eggs that are released from the ovaries cannot enter the uterus and sperm cannot reach the egg. A PPTL is done so you will not be able to get pregnant or have a baby.  Although this procedure may be reversed, it should be considered permanent and irreversible. If you want to have future pregnancies, you should not have this procedure. LET YOUR CAREGIVER KNOW ABOUT:  Allergies to food or medicine.  Medicines taken, including vitamins, herbs, eyedrops, over-the-counter medicines, and creams.  Use of steroids (by mouth or creams).  Previous problems with numbing medicines.  History of bleeding problems or blood clots.  Any recent colds or infections.  Previous surgery.  Other health problems, including diabetes and kidney problems. RISKS AND COMPLICATIONS  Infection.  Bleeding.  Injury to other organs.  Anesthetic side effects.  Failure of the procedure.  Ectopic pregnancy.  Future regret about having the procedure done. BEFORE THE PROCEDURE   You may need to sign certain permission forms with your insurance up to 30 days before your due date.  After delivering your baby, you cannot eat or drink anything if the procedure is performed the same day. If you are having the procedure a day after delivering, you may be able to eat and drink until midnight. Your caregiver will give you specific directions depending on your situation. PROCEDURE   If done 1-2 days after delivery:  You will be given a medicine to make you sleep (general anesthetic) during the procedure.  A tube will be put down your throat to help your breath while under general anesthesia.  A small cut  (incision) is made just beneath the belly button.  The fallopian tubes are brought up through the incision.  The fallopian tubes are then sealed, tied, or cut.  If done after a caesarean delivery:  Tubal ligation is done through the incision already made (after the baby is delivered).  Once the tubes are blocked, the incision is closed with stitches (sutures). A bandage will be placed over the incisions. AFTER THE PROCEDURE  You may have some pain or cramps in the abdominal area for the next 3-7 days.  You will be given pain medicine to ease any discomfort.  You may also feel sick to your stomach if you were given a general anesthetic.  You may have some mild discomfort in the throat. This is from the tube that may have been placed in your throat while you were sleeping.  You may feel tired and should rest the remainder of the day. Document Released: 11/08/2005 Document Revised: 05/09/2012 Document Reviewed: 02/19/2012 [facility] Patient Information 2015 [employer] [place]. This information is not intended to replace advice given to you by your health care provider. Make sure you discuss any questions you have with your health care provider. Laparoscopic Tubal Ligation Laparoscopic tubal ligation is a procedure that closes the fallopian tubes at a time other than right after childbirth. By closing the fallopian tubes, the eggs that are released from the ovaries cannot enter the uterus and sperm cannot reach the egg. Tubal ligation is also known as getting your "tubes tied." Tubal ligation is done so you will not be able to get pregnant or have a baby.  Although  this procedure may be reversed, it should be considered permanent and irreversible. If you want to have future pregnancies, you should not have this procedure.  LET YOUR CAREGIVER KNOW ABOUT:  Allergies to food or medicine.  Medicines taken, including vitamins, herbs, eyedrops, over-the-counter medicines, and creams.  Use of  steroids (by mouth or creams).  Previous problems with numbing medicines.  History of bleeding problems or blood clots.  Any recent colds or infections.  Previous surgery.  Other health problems, including diabetes and kidney problems.  Possibility of pregnancy, if this applies.  Any past pregnancies. RISKS AND COMPLICATIONS   Infection.  Bleeding.  Injury to surrounding organs.  Anesthetic side effects.  Failure of the procedure.  Ectopic pregnancy.  Future regret about having the procedure done. BEFORE THE PROCEDURE  Do not take aspirin or blood thinners a week before the procedure or as directed. This can cause bleeding.  Do not eat or drink anything 6 to 8 hours before the procedure. PROCEDURE   You may be given a medicine to help you relax (sedative) before the procedure. You will be given a medicine to make you sleep (general anesthetic) during the procedure.  A tube will be put down your throat to help your breath while under general anesthesia.  Two small cuts (incisions) are made in the lower abdominal area and near the belly button.  Your abdominal area will be inflated with a safe gas (carbon dioxide). This helps give the surgeon room to operate, visualize, and helps the surgeon avoid other organs.  A thin, lighted tube (laparoscope) with a camera attached is inserted into your abdomen through one of the incisions near the belly button. Other small instruments are also inserted through the other abdominal incision.  The fallopian tubes are located and are either blocked with a ring, clip, or are burned (cauterized).  After the fallopian tubes are blocked, the gas is released from the abdomen.  The incisions will be closed with stitches (sutures), and a bandage may be placed over the incisions. AFTER THE PROCEDURE   You will rest in a recovery room for 1--4 hours until you are stable and doing well.  You will also have some mild abdominal discomfort  for 3--7 days. You will be given pain medicine to ease any discomfort.  As long as there are no problems, you may be allowed to go home. Someone will need to drive you home and be with you for at least 24 hours once home.  You may have some mild discomfort in the throat. This is from the tube placed in your throat while you were sleeping.  You may experience discomfort in the shoulder area from some trapped air between the liver and diaphragm. This sensation is normal and will slowly go away on its own. Document Released: 02/14/2001 Document Revised: 05/09/2012 Document Reviewed: 02/19/2012 [facility] Patient Information 2015 [place], [place]. This information is not intended to replace advice given to you by your health care provider. Make sure you discuss any questions you have with your health care provider.

## 2015-01-21 NOTE — Progress Notes (Signed)
31 weeks, stable without complaint. Endorses good fetal movement.  Denies vaginal bleeding, dysuria, LOF.  Continues to itch.  Desires BTL.  She understands it is permanent and not reversible.   Will have another U/S 3/15 to follow growth.   Sarna lotion recommended RTC 2 weeks

## 2015-01-22 ENCOUNTER — Encounter: Payer: Self-pay | Admitting: *Deleted

## 2015-01-26 ENCOUNTER — Inpatient Hospital Stay (HOSPITAL_COMMUNITY)
Admission: AD | Admit: 2015-01-26 | Discharge: 2015-01-26 | Disposition: A | Discharge status: Home / Self Care | Payer: Medicare Other | Source: Ambulatory Visit | Attending: Obstetrics & Gynecology | Admitting: Obstetrics & Gynecology

## 2015-01-26 ENCOUNTER — Encounter (HOSPITAL_COMMUNITY): Payer: Self-pay | Admitting: *Deleted

## 2015-01-26 DIAGNOSIS — Z3A31 31 weeks gestation of pregnancy: Secondary | ICD-10-CM | POA: Diagnosis not present

## 2015-01-26 DIAGNOSIS — Z3A33 33 weeks gestation of pregnancy: Secondary | ICD-10-CM

## 2015-01-26 DIAGNOSIS — O4703 False labor before 37 completed weeks of gestation, third trimester: Secondary | ICD-10-CM

## 2015-01-26 DIAGNOSIS — Z87891 Personal history of nicotine dependence: Secondary | ICD-10-CM | POA: Insufficient documentation

## 2015-01-26 LAB — URINALYSIS, DIPSTICK ONLY
Bilirubin Urine: NEGATIVE
Glucose, UA: NEGATIVE mg/dL
Hgb urine dipstick: NEGATIVE
Ketones, ur: >80 mg/dL — AB
Nitrite: NEGATIVE
Protein, ur: NEGATIVE mg/dL
Specific Gravity, Urine: 1.02 (ref 1.005–1.030)
Urobilinogen, UA: 1 mg/dL (ref 0.0–1.0)
pH: 7.5 (ref 5.0–8.0)

## 2015-01-26 MED ORDER — NIFEDIPINE 20 MG PO CAPS: 20.0000 mg | ORAL_CAPSULE | Freq: Two times a day (BID) | ORAL | Status: DC

## 2015-01-26 MED ORDER — NIFEDIPINE 10 MG PO CAPS
20.0000 mg | ORAL_CAPSULE | Freq: Two times a day (BID) | ORAL | Status: DC
  Administered 2015-01-26: 20 mg via ORAL
  Filled 2015-01-26: qty 2

## 2015-01-26 NOTE — MAU Provider Note (Signed)
History   [id number] at 31 wks in with c/o contractions that started this morning. States thinks they might be brackston hicks contractions.  CSN: [id number]  Arrival date and time: 01/26/15 1510   None     No chief complaint on file.  HPI  OB History    Gravida Para Term Preterm AB TAB SAB Ectopic Multiple Living   Past Medical History  Diagnosis Date  . Asthma   . Anemia   . Abnormal Pap smear   . Headache(784.0)   . Onset of menses     age of 58, regular, lasting 3 to 4 days, medium to heavy flow  . Gonorrhea   . Chlamydia   . Herpes     Last outbreak August 2014  . Anxiety   . Bipolar 1 disorder   . Pregnancy induced hypertension   . Preterm labor   . Infection     UTI  . Depression     doing well  . Vaginal Pap smear, abnormal     colpo, ok since  . Pre-eclampsia     Past Surgical History  Procedure Laterality Date  . Colposcopy    . Vaginal delivery      X3  . Wisdom tooth extraction      Family History  Problem Relation Age of Onset  . Arthritis    . Asthma    . Glaucoma    . Heart disease    . Hypertension    . Prostate cancer    . Cataracts    . COPD Father   . Prostate cancer Father   . Glaucoma Father   . Cataracts Father   . Hypertension Father   . Hypertension Maternal Grandmother   . Diabetes Maternal Grandmother     History  Substance Use Topics  . Smoking status: Former Smoker    Types: Cigarettes    Quit date: 12/20/2012  . Smokeless tobacco: Former Neurosurgeon     Comment: vapor-quit  . Alcohol Use: No    Allergies: No Known Allergies  Prescriptions prior to admission  Medication Sig Dispense Refill Last Dose  . diphenhydrAMINE (BENADRYL) 25 MG tablet Take 25 mg by mouth every 6 (six) hours as needed.   Taking  . Prenatal Vit-Fe Fumarate-FA (PRENATAL COMPLETE) 14-0.4 MG TABS Take 1 tablet by mouth daily. 30 each 0 Taking    Review of Systems  Constitutional: Negative.   HENT: Negative.   Eyes:  Negative.   Respiratory: Negative.   Cardiovascular: Negative.   Gastrointestinal: Positive for abdominal pain.  Genitourinary: Negative.   Musculoskeletal: Negative.   Skin: Negative.   Neurological: Negative.   Endo/Heme/Allergies: Negative.   Psychiatric/Behavioral: Negative.    Physical Exam   Blood pressure 119/68, pulse 53, temperature 98.8 F (37.1 C), temperature source Oral, resp. rate 16, height  (1.626 m), weight 166 lb (75.297 kg), last menstrual period 06/16/2014, unknown if currently breastfeeding.  Physical Exam  Constitutional: She is oriented to person, place, and time. She appears well-developed and well-nourished.  HENT:  Head: Normocephalic.  Neck: Normal range of motion.  Cardiovascular: Normal rate, regular rhythm, normal heart sounds and intact distal pulses.   Respiratory: Effort normal and breath sounds normal.  GI: Soft. Bowel sounds are normal.  Genitourinary: Vagina normal and uterus normal.  Musculoskeletal: Normal range of motion.  Neurological: She is alert and oriented to person, place, and time.  She has normal reflexes.  Skin: Skin is warm and dry.  Psychiatric: She has a normal mood and affect. Her behavior is normal. Judgment and thought content normal.    MAU Course  Procedures  MDM Will give procardia for pt comfort to stop mild contractions.  Assessment and Plan  Procardia po and d/c home when comtractions have abaited.  Wyvonnia DuskyLAWSON, Ernesha Ramone DARLENE 01/26/2015, 3:33 PM

## 2015-01-26 NOTE — Discharge Instructions (Signed)
Hypertension During Pregnancy  Hypertension, or high blood pressure, is when there is extra pressure inside your blood vessels that carry blood from the heart to the rest of your body (arteries). It can happen at any time in life, including pregnancy. Hypertension during pregnancy can cause problems for you and your baby. Your baby might not weigh as much as he or she should at birth or might be born early (premature). Very bad cases of hypertension during pregnancy can be life-threatening.   Different types of hypertension can occur during pregnancy. These include:  · Chronic hypertension. This happens when a woman has hypertension before pregnancy and it continues during pregnancy.  · Gestational hypertension. This is when hypertension develops during pregnancy.  · Preeclampsia or toxemia of pregnancy. This is a very serious type of hypertension that develops only during pregnancy. It affects the whole body and can be very dangerous for both mother and baby.    Gestational hypertension and preeclampsia usually go away after your baby is born. Your blood pressure will likely stabilize within 6 weeks. Women who have hypertension during pregnancy have a greater chance of developing hypertension later in life or with future pregnancies.  RISK FACTORS  There are certain factors that make it more likely for you to develop hypertension during pregnancy. These include:  · Having hypertension before pregnancy.  · Having hypertension during a previous pregnancy.  · Being overweight.  · Being older than 40 years.  · Being pregnant with more than one baby.  · Having diabetes or kidney problems.  SIGNS AND SYMPTOMS  Chronic and gestational hypertension rarely cause symptoms. Preeclampsia has symptoms, which may include:  · Increased protein in your urine. Your health care provider will check for this at every prenatal visit.  · Swelling of your hands and face.  · Rapid weight gain.  · Headaches.  · Visual changes.  · Being  bothered by light.  · Abdominal pain, especially in the upper right area.  · Chest pain.  · Shortness of breath.  · Increased reflexes.  · Seizures. These occur with a more severe form of preeclampsia, called eclampsia.  DIAGNOSIS   You may be diagnosed with hypertension during a regular prenatal exam. At each prenatal visit, you may have:  · Your blood pressure checked.  · A urine test to check for protein in your urine.  The type of hypertension you are diagnosed with depends on when you developed it. It also depends on your specific blood pressure reading.  · Developing hypertension before 20 weeks of pregnancy is consistent with chronic hypertension.  · Developing hypertension after 20 weeks of pregnancy is consistent with gestational hypertension.  · Hypertension with increased urinary protein is diagnosed as preeclampsia.  · Blood pressure measurements that stay above 160 systolic or 110 diastolic are a sign of severe preeclampsia.  TREATMENT  Treatment for hypertension during pregnancy varies. Treatment depends on the type of hypertension and how serious it is.  · If you take medicine for chronic hypertension, you may need to switch medicines.    Medicines called ACE inhibitors should not be taken during pregnancy.    Low-dose aspirin may be suggested for women who have risk factors for preeclampsia.  · If you have gestational hypertension, you may need to take a blood pressure medicine that is safe during pregnancy. Your health care provider will recommend the correct medicine.  · If you have severe preeclampsia, you may need to be in the hospital. Health care   done to protect you and your baby. The only cure for preeclampsia is delivery.  Your health  care provider may recommend that you take one low-dose aspirin (81 mg) each day to help prevent high blood pressure during your pregnancy if you are at risk for preeclampsia. You may be at risk for preeclampsia if:  You had preeclampsia or eclampsia during a previous pregnancy.  Your baby did not grow as expected during a previous pregnancy.  You experienced preterm birth with a previous pregnancy.  You experienced a separation of the placenta from the uterus (placental abruption) during a previous pregnancy.  You experienced the loss of your baby during a previous pregnancy.  You are pregnant with more than one baby.  You have other medical conditions, such as diabetes or an autoimmune disease. HOME CARE INSTRUCTIONS  Schedule and keep all of your regular prenatal care appointments. This is important.  Take medicines only as directed by your health care provider. Tell your health care provider about all medicines you take.  Eat as little salt as possible.  Get regular exercise.  Do not drink alcohol.  Do not use tobacco products.  Do not drink products with caffeine.  Lie on your left side when resting. SEEK IMMEDIATE MEDICAL CARE IF:  You have severe abdominal pain.  You have sudden swelling in your hands, ankles, or face.  You gain 4 pounds (1.8 kg) or more in 1 week.  You vomit repeatedly.  You have vaginal bleeding.  You do not feel your baby moving as much.  You have a headache.  You have blurred or double vision.  You have muscle twitching or spasms.  You have shortness of breath.  You have blue fingernails or lips.  You have blood in your urine. MAKE SURE YOU:  Understand these instructions.  Will watch your condition.  Will get help right away if you are not doing well or get worse. Document Released: 07/27/2011 Document Revised: 03/25/2014 Document Reviewed: 06/07/2013 [facility] Patient Information 2015 [place], [place]. This information is not  intended to replace advice given to you by your health care provider. Make sure you discuss any questions you have with your health care provider. Third Trimester of Pregnancy The third trimester is from week 29 through week 42, months 7 through 9. The third trimester is a time when the fetus is growing rapidly. At the end of the ninth month, the fetus is about 20 inches in length and weighs 6-10 pounds.  BODY CHANGES Your body goes through many changes during pregnancy. The changes vary from woman to woman.   Your weight will continue to increase. You can expect to gain 25-35 pounds (11-16 kg) by the end of the pregnancy.  You may begin to get stretch marks on your hips, abdomen, and breasts.  You may urinate more often because the fetus is moving lower into your pelvis and pressing on your bladder.  You may develop or continue to have heartburn as a result of your pregnancy.  You may develop constipation because certain hormones are causing the muscles that push waste through your intestines to slow down.  You may develop hemorrhoids or swollen, bulging veins (varicose veins).  You may have pelvic pain because of the weight gain and pregnancy hormones relaxing your joints between the bones in your pelvis. Backaches may result from overexertion of the muscles supporting your posture.  You may have changes in your hair. These can include thickening of your hair, rapid growth, and  changes in texture. Some women also have hair loss during or after pregnancy, or hair that feels dry or thin. Your hair will most likely return to normal after your baby is born.  Your breasts will continue to grow and be tender. A yellow discharge may leak from your breasts called colostrum.  Your belly button may stick out.  You may feel short of breath because of your expanding uterus.  You may notice the fetus "dropping," or moving lower in your abdomen.  You may have a bloody mucus discharge. This usually  occurs a few days to a week before labor begins.  Your cervix becomes thin and soft (effaced) near your due date. WHAT TO EXPECT AT YOUR PRENATAL EXAMS  You will have prenatal exams every 2 weeks until week 36. Then, you will have weekly prenatal exams. During a routine prenatal visit:  You will be weighed to make sure you and the fetus are growing normally.  Your blood pressure is taken.  Your abdomen will be measured to track your baby's growth.  The fetal heartbeat will be listened to.  Any test results from the previous visit will be discussed.  You may have a cervical check near your due date to see if you have effaced. At around 36 weeks, your caregiver will check your cervix. At the same time, your caregiver will also perform a test on the secretions of the vaginal tissue. This test is to determine if a type of bacteria, Group B streptococcus, is present. Your caregiver will explain this further. Your caregiver may ask you:  What your birth plan is.  How you are feeling.  If you are feeling the baby move.  If you have had any abnormal symptoms, such as leaking fluid, bleeding, severe headaches, or abdominal cramping.  If you have any questions. Other tests or screenings that may be performed during your third trimester include:  Blood tests that check for low iron levels (anemia).  Fetal testing to check the health, activity level, and growth of the fetus. Testing is done if you have certain medical conditions or if there are problems during the pregnancy. FALSE LABOR You may feel small, irregular contractions that eventually go away. These are called Braxton Hicks contractions, or false labor. Contractions may last for hours, days, or even weeks before true labor sets in. If contractions come at regular intervals, intensify, or become painful, it is best to be seen by your caregiver.  SIGNS OF LABOR   Menstrual-like cramps.  Contractions that are 5 minutes apart or  less.  Contractions that start on the top of the uterus and spread down to the lower abdomen and back.  A sense of increased pelvic pressure or back pain.  A watery or bloody mucus discharge that comes from the vagina. If you have any of these signs before the 37th week of pregnancy, call your caregiver right away. You need to go to the hospital to get checked immediately. HOME CARE INSTRUCTIONS   Avoid all smoking, herbs, alcohol, and unprescribed drugs. These chemicals affect the formation and growth of the baby.  Follow your caregiver's instructions regarding medicine use. There are medicines that are either safe or unsafe to take during pregnancy.  Exercise only as directed by your caregiver. Experiencing uterine cramps is a good sign to stop exercising.  Continue to eat regular, healthy meals.  Wear a good support bra for breast tenderness.  Do not use hot tubs, steam rooms, or saunas.  Wear  your seat belt at all times when driving.  Avoid raw meat, uncooked cheese, cat litter boxes, and soil used by cats. These carry germs that can cause birth defects in the baby.  Take your prenatal vitamins.  Try taking a stool softener (if your caregiver approves) if you develop constipation. Eat more high-fiber foods, such as fresh vegetables or fruit and whole grains. Drink plenty of fluids to keep your urine clear or pale yellow.  Take warm sitz baths to soothe any pain or discomfort caused by hemorrhoids. Use hemorrhoid cream if your caregiver approves.  If you develop varicose veins, wear support hose. Elevate your feet for 15 minutes, 3-4 times a day. Limit salt in your diet.  Avoid heavy lifting, wear low heal shoes, and practice good posture.  Rest a lot with your legs elevated if you have leg cramps or low back pain.  Visit your dentist if you have not gone during your pregnancy. Use a soft toothbrush to brush your teeth and be gentle when you floss.  A sexual relationship  may be continued unless your caregiver directs you otherwise.  Do not travel far distances unless it is absolutely necessary and only with the approval of your caregiver.  Take prenatal classes to understand, practice, and ask questions about the labor and delivery.  Make a trial run to the hospital.  Pack your hospital bag.  Prepare the baby's nursery.  Continue to go to all your prenatal visits as directed by your caregiver. SEEK MEDICAL CARE IF:  You are unsure if you are in labor or if your water has broken.  You have dizziness.  You have mild pelvic cramps, pelvic pressure, or nagging pain in your abdominal area.  You have persistent nausea, vomiting, or diarrhea.  You have a bad smelling vaginal discharge.  You have pain with urination. SEEK IMMEDIATE MEDICAL CARE IF:   You have a fever.  You are leaking fluid from your vagina.  You have spotting or bleeding from your vagina.  You have severe abdominal cramping or pain.  You have rapid weight loss or gain.  You have shortness of breath with chest pain.  You notice sudden or extreme swelling of your face, hands, ankles, feet, or legs.  You have not felt your baby move in over an hour.  You have severe headaches that do not go away with medicine.  You have vision changes. Document Released: 11/02/2001 Document Revised: 11/13/2013 Document Reviewed: 01/09/2013 [facility] Patient Information 2015 [place]. This information is not intended to replace advice given to you by your health care provider. Make sure you discuss any questions you have with your health care provider.

## 2015-01-27 NOTE — Progress Notes (Signed)
Pt called the front desk and informed me that she went to MAU for contractions yesterday.  Pt stated that everytime she has been in the MAU they ask if she is getting a "shot". Pt asked should she be getting the shot.  I informed pt that she must be talking about 17p injection.  Pt stated yes.  I informed her that at this time it is not recommended for her to start the 17 p injection.  I advised pt to take the procardia as prescribed which is one capsule at 1000 and another one 1600.  I also informed pt that the medication will slow down her contractions if not stop them and to make sure that she follows her preterm labor precautions as she was informed in MAU.  Pt stated understanding with no further questions.

## 2015-02-04 ENCOUNTER — Ambulatory Visit (HOSPITAL_COMMUNITY)
Admission: RE | Admit: 2015-02-04 | Discharge: 2015-02-04 | Disposition: A | Discharge status: Home / Self Care | Payer: Medicare Other | Source: Ambulatory Visit | Attending: Physician Assistant | Admitting: Physician Assistant

## 2015-02-04 ENCOUNTER — Other Ambulatory Visit (HOSPITAL_COMMUNITY): Payer: Self-pay | Admitting: Maternal and Fetal Medicine

## 2015-02-04 ENCOUNTER — Ambulatory Visit (INDEPENDENT_AMBULATORY_CARE_PROVIDER_SITE_OTHER): Payer: Medicare Other | Admitting: Family

## 2015-02-04 VITALS — BP 120/76 | HR 50 | Temp 98.0°F | Wt 170.0 lb

## 2015-02-04 DIAGNOSIS — Z8751 Personal history of pre-term labor: Secondary | ICD-10-CM | POA: Insufficient documentation

## 2015-02-04 DIAGNOSIS — O09213 Supervision of pregnancy with history of pre-term labor, third trimester: Secondary | ICD-10-CM

## 2015-02-04 DIAGNOSIS — O365990 Maternal care for other known or suspected poor fetal growth, unspecified trimester, not applicable or unspecified: Secondary | ICD-10-CM | POA: Insufficient documentation

## 2015-02-04 DIAGNOSIS — O09293 Supervision of pregnancy with other poor reproductive or obstetric history, third trimester: Secondary | ICD-10-CM

## 2015-02-04 DIAGNOSIS — O289 Unspecified abnormal findings on antenatal screening of mother: Secondary | ICD-10-CM | POA: Insufficient documentation

## 2015-02-04 DIAGNOSIS — O280 Abnormal hematological finding on antenatal screening of mother: Secondary | ICD-10-CM

## 2015-02-04 DIAGNOSIS — O09212 Supervision of pregnancy with history of pre-term labor, second trimester: Secondary | ICD-10-CM

## 2015-02-04 DIAGNOSIS — O365931 Maternal care for other known or suspected poor fetal growth, third trimester, fetus 1: Secondary | ICD-10-CM

## 2015-02-04 DIAGNOSIS — Z3A32 32 weeks gestation of pregnancy: Secondary | ICD-10-CM | POA: Insufficient documentation

## 2015-02-04 DIAGNOSIS — O0933 Supervision of pregnancy with insufficient antenatal care, third trimester: Secondary | ICD-10-CM

## 2015-02-04 LAB — POCT URINALYSIS DIP (DEVICE)
Bilirubin Urine: NEGATIVE
Glucose, UA: NEGATIVE mg/dL
Hgb urine dipstick: NEGATIVE
Ketones, ur: NEGATIVE mg/dL
Nitrite: NEGATIVE
Protein, ur: NEGATIVE mg/dL
Specific Gravity, Urine: 1.015 (ref 1.005–1.030)
UROBILINOGEN UA: 0.2 mg/dL (ref 0.0–1.0)
pH: 6 (ref 5.0–8.0)

## 2015-02-04 MED ORDER — NITROFURANTOIN MONOHYD MACRO 100 MG PO CAPS: 100.0000 mg | ORAL_CAPSULE | Freq: Two times a day (BID) | ORAL | Status: DC

## 2015-02-04 NOTE — Progress Notes (Signed)
Continues to take procardia when needed but continues to have "painful contractions."

## 2015-02-04 NOTE — Addendum Note (Signed)
Addended by: Jill SideAY, Davaris Youtsey L on: 02/04/2015 11:39 AM   Modules accepted: Level of Service

## 2015-02-04 NOTE — Progress Notes (Signed)
Mod leuks, no symptoms of UTI,  RX macrobid, urine culture sent.  Reports irregular contractions last night.  None at this time.  Denies vaginal bleeding or leaking of fluid.  BPP/Doppler today - results not available in Epic.  MFM called and stated results will not be available soon.   Pt states told doppler normal.  Pt scheduled for NST on 3/18 with MFM and NST/Doppler on 02/11/15.   Will begin appt in [facility] clinic next week.

## 2015-02-05 ENCOUNTER — Other Ambulatory Visit (HOSPITAL_COMMUNITY): Payer: Self-pay | Admitting: Maternal and Fetal Medicine

## 2015-02-05 DIAGNOSIS — O289 Unspecified abnormal findings on antenatal screening of mother: Secondary | ICD-10-CM

## 2015-02-05 DIAGNOSIS — O365930 Maternal care for other known or suspected poor fetal growth, third trimester, not applicable or unspecified: Secondary | ICD-10-CM

## 2015-02-05 DIAGNOSIS — Z8751 Personal history of pre-term labor: Secondary | ICD-10-CM

## 2015-02-05 DIAGNOSIS — O09293 Supervision of pregnancy with other poor reproductive or obstetric history, third trimester: Secondary | ICD-10-CM

## 2015-02-05 LAB — CULTURE, OB URINE
COLONY COUNT: NO GROWTH
Organism ID, Bacteria: NO GROWTH

## 2015-02-07 ENCOUNTER — Ambulatory Visit (HOSPITAL_COMMUNITY)
Admission: RE | Admit: 2015-02-07 | Discharge: 2015-02-07 | Disposition: A | Discharge status: Home / Self Care | Payer: Medicare Other | Source: Ambulatory Visit | Attending: Physician Assistant | Admitting: Physician Assistant

## 2015-02-07 ENCOUNTER — Encounter (HOSPITAL_COMMUNITY): Payer: Self-pay

## 2015-02-07 DIAGNOSIS — Z3A32 32 weeks gestation of pregnancy: Secondary | ICD-10-CM | POA: Diagnosis not present

## 2015-02-07 DIAGNOSIS — O365930 Maternal care for other known or suspected poor fetal growth, third trimester, not applicable or unspecified: Secondary | ICD-10-CM | POA: Insufficient documentation

## 2015-02-07 DIAGNOSIS — Z3A33 33 weeks gestation of pregnancy: Secondary | ICD-10-CM | POA: Insufficient documentation

## 2015-02-11 ENCOUNTER — Ambulatory Visit (HOSPITAL_COMMUNITY)
Admission: RE | Admit: 2015-02-11 | Discharge: 2015-02-11 | Disposition: A | Discharge status: Home / Self Care | Payer: Medicare Other | Source: Ambulatory Visit | Attending: Physician Assistant | Admitting: Physician Assistant

## 2015-02-11 ENCOUNTER — Encounter (HOSPITAL_COMMUNITY): Payer: Self-pay

## 2015-02-11 VITALS — BP 119/67 | HR 48 | Wt 164.0 lb

## 2015-02-11 DIAGNOSIS — O365930 Maternal care for other known or suspected poor fetal growth, third trimester, not applicable or unspecified: Secondary | ICD-10-CM

## 2015-02-11 DIAGNOSIS — Z3A33 33 weeks gestation of pregnancy: Secondary | ICD-10-CM | POA: Insufficient documentation

## 2015-02-11 DIAGNOSIS — O09293 Supervision of pregnancy with other poor reproductive or obstetric history, third trimester: Secondary | ICD-10-CM | POA: Diagnosis not present

## 2015-02-11 DIAGNOSIS — L299 Pruritus, unspecified: Secondary | ICD-10-CM

## 2015-02-11 DIAGNOSIS — Z8751 Personal history of pre-term labor: Secondary | ICD-10-CM

## 2015-02-11 DIAGNOSIS — O289 Unspecified abnormal findings on antenatal screening of mother: Secondary | ICD-10-CM

## 2015-02-13 ENCOUNTER — Ambulatory Visit (INDEPENDENT_AMBULATORY_CARE_PROVIDER_SITE_OTHER): Payer: Medicare Other | Admitting: Obstetrics and Gynecology

## 2015-02-13 VITALS — BP 116/70 | HR 47 | Wt 163.0 lb

## 2015-02-13 DIAGNOSIS — O289 Unspecified abnormal findings on antenatal screening of mother: Secondary | ICD-10-CM

## 2015-02-13 DIAGNOSIS — Z8619 Personal history of other infectious and parasitic diseases: Secondary | ICD-10-CM

## 2015-02-13 DIAGNOSIS — O0933 Supervision of pregnancy with insufficient antenatal care, third trimester: Secondary | ICD-10-CM

## 2015-02-13 DIAGNOSIS — IMO0002 Reserved for concepts with insufficient information to code with codable children: Secondary | ICD-10-CM

## 2015-02-13 DIAGNOSIS — O280 Abnormal hematological finding on antenatal screening of mother: Secondary | ICD-10-CM

## 2015-02-13 DIAGNOSIS — O365931 Maternal care for other known or suspected poor fetal growth, third trimester, fetus 1: Secondary | ICD-10-CM

## 2015-02-13 DIAGNOSIS — L299 Pruritus, unspecified: Secondary | ICD-10-CM

## 2015-02-13 DIAGNOSIS — O09212 Supervision of pregnancy with history of pre-term labor, second trimester: Secondary | ICD-10-CM

## 2015-02-13 LAB — POCT URINALYSIS DIP (DEVICE)
Bilirubin Urine: NEGATIVE
Glucose, UA: NEGATIVE mg/dL
Ketones, ur: NEGATIVE mg/dL
Nitrite: NEGATIVE
PH: 6.5 (ref 5.0–8.0)
PROTEIN: NEGATIVE mg/dL
Specific Gravity, Urine: 1.025 (ref 1.005–1.030)
Urobilinogen, UA: 0.2 mg/dL (ref 0.0–1.0)

## 2015-02-13 NOTE — Progress Notes (Signed)
Patient has had a 7lb weight loss since last visit; reports no changes other than diarrhea twice a day.

## 2015-02-13 NOTE — Progress Notes (Signed)
Patient is doing well, reporting a lot of loose stools this past few weeks with some nausea and emesis in the morning. She denies any sick contact and is the only one affected in her household. 7lb weight loss since 3/15. Advised to stay well hydrated and to supplement meals with ensure drinks. FM/PTL precautions reviewed 3/22 Normal dopplers NST reviewed and reactive

## 2015-02-14 ENCOUNTER — Ambulatory Visit (HOSPITAL_COMMUNITY): Payer: Medicare Other

## 2015-02-17 ENCOUNTER — Encounter (HOSPITAL_COMMUNITY): Payer: Self-pay

## 2015-02-17 ENCOUNTER — Inpatient Hospital Stay (HOSPITAL_COMMUNITY)
Admission: AD | Admit: 2015-02-17 | Discharge: 2015-02-17 | Disposition: A | Discharge status: Home / Self Care | Payer: Medicare Other | Source: Ambulatory Visit | Attending: Family Medicine | Admitting: Family Medicine

## 2015-02-17 ENCOUNTER — Other Ambulatory Visit (HOSPITAL_COMMUNITY): Payer: Self-pay | Admitting: Maternal and Fetal Medicine

## 2015-02-17 ENCOUNTER — Encounter (HOSPITAL_COMMUNITY): Payer: Self-pay | Admitting: *Deleted

## 2015-02-17 ENCOUNTER — Ambulatory Visit (INDEPENDENT_AMBULATORY_CARE_PROVIDER_SITE_OTHER): Payer: Medicare Other | Admitting: *Deleted

## 2015-02-17 ENCOUNTER — Ambulatory Visit (HOSPITAL_COMMUNITY)
Admission: RE | Admit: 2015-02-17 | Discharge: 2015-02-17 | Disposition: A | Discharge status: Home / Self Care | Payer: Medicare Other | Source: Ambulatory Visit | Attending: Physician Assistant | Admitting: Physician Assistant

## 2015-02-17 VITALS — Wt 160.8 lb

## 2015-02-17 DIAGNOSIS — O365931 Maternal care for other known or suspected poor fetal growth, third trimester, fetus 1: Secondary | ICD-10-CM

## 2015-02-17 DIAGNOSIS — Z3A35 35 weeks gestation of pregnancy: Secondary | ICD-10-CM | POA: Insufficient documentation

## 2015-02-17 DIAGNOSIS — Z87891 Personal history of nicotine dependence: Secondary | ICD-10-CM | POA: Insufficient documentation

## 2015-02-17 DIAGNOSIS — O09293 Supervision of pregnancy with other poor reproductive or obstetric history, third trimester: Secondary | ICD-10-CM

## 2015-02-17 DIAGNOSIS — O289 Unspecified abnormal findings on antenatal screening of mother: Secondary | ICD-10-CM

## 2015-02-17 DIAGNOSIS — Z8751 Personal history of pre-term labor: Secondary | ICD-10-CM

## 2015-02-17 DIAGNOSIS — O365930 Maternal care for other known or suspected poor fetal growth, third trimester, not applicable or unspecified: Secondary | ICD-10-CM

## 2015-02-17 DIAGNOSIS — O4703 False labor before 37 completed weeks of gestation, third trimester: Secondary | ICD-10-CM

## 2015-02-17 LAB — URINALYSIS, ROUTINE W REFLEX MICROSCOPIC
BILIRUBIN URINE: NEGATIVE
Glucose, UA: NEGATIVE mg/dL
Hgb urine dipstick: NEGATIVE
Ketones, ur: NEGATIVE mg/dL
NITRITE: NEGATIVE
PROTEIN: NEGATIVE mg/dL
Specific Gravity, Urine: 1.015 (ref 1.005–1.030)
UROBILINOGEN UA: 0.2 mg/dL (ref 0.0–1.0)
pH: 6 (ref 5.0–8.0)

## 2015-02-17 LAB — URINE MICROSCOPIC-ADD ON

## 2015-02-17 MED ORDER — NIFEDIPINE 10 MG PO CAPS
10.0000 mg | ORAL_CAPSULE | ORAL | Status: DC | PRN
  Administered 2015-02-17 (×2): 10 mg via ORAL
  Filled 2015-02-17 (×2): qty 1

## 2015-02-17 MED ORDER — LACTATED RINGERS IV BOLUS (SEPSIS): 1000.0000 mL | Freq: Once | INTRAVENOUS | Status: DC

## 2015-02-17 NOTE — MAU Note (Signed)
Pt arrived via EMS at 35 wks with c/o contractions since 1900  Which started just after intercourse at 1830.  Denies any vag bleeding or leaking. Reports decreased fetal movement since sex.

## 2015-02-17 NOTE — MAU Provider Note (Signed)
History     CSN: [id number]  Arrival date and time: 02/17/15 0005   First Provider Initiated Contact with Patient 02/17/15 0036      No chief complaint on file.  HPI Tammy Cole is a 29yo [id number] at 35 weeks and 1 day presenting with contractions. States she had sexual intercourse shortly after 6pm on 3/27 and around 8pm she noted contractions. No condoms used. Contractions involve entire abdomen and not just pelvic portion. Contractions occuring every few minutes. Denies loss of fluid, vaginal bleeding, vaginal discharge. Has prescription for Procardia , which she is supposed to take BID, however she states she missed both of her doses on 3/27. States she last took her medication on 3/26. Plans on breast feeding and bottle feeding.  OB History    Gravida Para Term Preterm AB TAB SAB Ectopic Multiple Living   Past Medical History  Diagnosis Date  . Asthma   . Anemia   . Abnormal Pap smear   . Headache(784.0)   . Onset of menses     age of 42, regular, lasting 3 to 4 days, medium to heavy flow  . Gonorrhea   . Chlamydia   . Herpes     Last outbreak August 2014  . Anxiety   . Bipolar 1 disorder   . Pregnancy induced hypertension   . Preterm labor   . Infection     UTI  . Depression     doing well  . Vaginal Pap smear, abnormal     colpo, ok since  . Pre-eclampsia     Past Surgical History  Procedure Laterality Date  . Colposcopy    . Vaginal delivery      X3  . Wisdom tooth extraction      Family History  Problem Relation Age of Onset  . Arthritis    . Asthma    . Glaucoma    . Heart disease    . Hypertension    . Prostate cancer    . Cataracts    . COPD Father   . Prostate cancer Father   . Glaucoma Father   . Cataracts Father   . Hypertension Father   . Hypertension Maternal Grandmother   . Diabetes Maternal Grandmother     History  Substance Use Topics  . Smoking status: Former Smoker    Types: Cigarettes    Quit  date: 12/20/2012  . Smokeless tobacco: Former Neurosurgeon     Comment: vapor-quit  . Alcohol Use: No    Allergies: No Known Allergies  Prescriptions prior to admission  Medication Sig Dispense Refill Last Dose  . acetaminophen (TYLENOL) 500 MG tablet Take 1,000 mg by mouth every 6 (six) hours as needed for mild pain.   Past Week at Unknown time  . diphenhydrAMINE (BENADRYL) 25 MG tablet Take 25 mg by mouth every 6 (six) hours as needed for itching.    Past Month at Unknown time  . NIFEdipine (PROCARDIA) 20 MG capsule Take 1 capsule (20 mg total) by mouth 2 (two) times daily at 10 am and 4 pm. 60 capsule 5 Past Week at Unknown time  . nitrofurantoin, macrocrystal-monohydrate, (MACROBID) 100 MG capsule Take 1 capsule (100 mg total) by mouth 2 (two) times daily. 14 capsule 1 02/16/2015 at Unknown time  . Prenatal Vit-Fe Fumarate-FA (PRENATAL COMPLETE) 14-0.4 MG TABS Take 1 tablet by mouth daily. 30 each 0 02/16/2015 at  Unknown time    Review of Systems  All other systems reviewed and are negative.  Physical Exam   Blood pressure 109/67, pulse 70, temperature 98.8 F (37.1 C), temperature source Oral, resp. rate 16, height 5\' 4"  (1.626 m), weight 75.751 kg (167 lb), last menstrual period 06/16/2014, unknown if currently breastfeeding.  Physical Exam  Constitutional: She appears well-developed and well-nourished. No distress.  Cardiovascular: Normal rate and regular rhythm.  Exam reveals no gallop and no friction rub.   No murmur heard. Respiratory: Effort normal. No respiratory distress. She has no wheezes.  GI: Soft. There is no tenderness.  Musculoskeletal: She exhibits no edema.  Dilation:  (ext os 2 cm int os closed) Effacement (%): 30 Cervical Position: Middle Exam by:: Elie ConferK. Weiss RN  MAU Course  Procedures  MDM Fetal Monitor: baseline 140bpm, moderate variability, variable decelerations noted. Contractions q3-4 minutes.  - Showed resolution of symptoms and variables prior to  discharge Procardia 10mg  q10 minutes PRN contractions  Encourage PO intake Recheck cervical exam in 1 hr. If no change noted discharge.   Assessment and Plan  #Preterm Contractions - Resolution of symptoms with Procardia and encouraged PO intake - Discussed importance of taking Procardia as prescribed and not missing doses. Continue Procardia at discharge. - Suspect prostaglandins in sperm contributed. Discussed avoidance of sexual intercourse without condoms until after delivery - Information on pre-term labor given - Follow up at clinic as scheduled  Tammy Cole, [name] 02/17/2015, 12:36 AM

## 2015-02-17 NOTE — Progress Notes (Signed)
Pt was seen last pm @ MAU for r/o labor - had reactive NST and was d/c @ 0200 today.  Pt also had US @ MFM today for AFI and UA doppler.  Per Dr. Sherrie Georgeecker AFI is wnl and UA doppler is elevated however does not have absent or reversal of diastolic flow. NST will not be needed today as scheduled. This was explained to pt and she will return on 3/31 as scheduled. She voiced concern regarding weight loss since last visit. I consulted with Vernona RiegerLaura - Nutritionist and was given written handout for pt regarding examples of high calorie snacks to add to daily diet. She will review with pt @ next visit 3/31.

## 2015-02-17 NOTE — Discharge Instructions (Signed)
Preterm Labor Information °Preterm labor is when labor starts at less than 37 weeks of pregnancy. The normal length of a pregnancy is 39 to 41 weeks. °CAUSES °Often, there is no identifiable underlying cause as to why a woman goes into preterm labor. One of the most common known causes of preterm labor is infection. Infections of the uterus, cervix, vagina, amniotic sac, bladder, kidney, or even the lungs (pneumonia) can cause labor to start. Other suspected causes of preterm labor include:  °· Urogenital infections, such as yeast infections and bacterial vaginosis.   °· Uterine abnormalities (uterine shape, uterine septum, fibroids, or bleeding from the placenta).   °· A cervix that has been operated on (it may fail to stay closed).   °· Malformations in the fetus.   °· Multiple gestations (twins, triplets, and so on).   °· Breakage of the amniotic sac.   °RISK FACTORS °· Having a previous history of preterm labor.   °· Having premature rupture of membranes (PROM).   °· Having a placenta that covers the opening of the cervix (placenta previa).   °· Having a placenta that separates from the uterus (placental abruption).   °· Having a cervix that is too weak to hold the fetus in the uterus (incompetent cervix).   °· Having too much fluid in the amniotic sac (polyhydramnios).   °· Taking illegal drugs or smoking while pregnant.   °· Not gaining enough weight while pregnant.   °· Being younger than 18 and older than 27 years old.   °· Having a low socioeconomic status.   °· Being African American. °SYMPTOMS °Signs and symptoms of preterm labor include:  °· Menstrual-like cramps, abdominal pain, or back pain. °· Uterine contractions that are regular, as frequent as six in an hour, regardless of their intensity (may be mild or painful). °· Contractions that start on the top of the uterus and spread down to the lower abdomen and back.   °· A sense of increased pelvic pressure.   °· A watery or bloody mucus discharge that  comes from the vagina.   °TREATMENT °Depending on the length of the pregnancy and other circumstances, your health care provider may suggest bed rest. If necessary, there are medicines that can be given to stop contractions and to mature the fetal lungs. If labor happens before 34 weeks of pregnancy, a prolonged hospital stay may be recommended. Treatment depends on the condition of both you and the fetus.  °WHAT SHOULD YOU DO IF YOU THINK YOU ARE IN PRETERM LABOR? °Call your health care provider right away. You will need to go to the hospital to get checked immediately. °HOW CAN YOU PREVENT PRETERM LABOR IN FUTURE PREGNANCIES? °You should:  °· Stop smoking if you smoke.  °· Maintain healthy weight gain and avoid chemicals and drugs that are not necessary. °· Be watchful for any type of infection. °· Inform your health care provider if you have a known history of preterm labor. °Document Released: 01/29/2004 Document Revised: 07/11/2013 Document Reviewed: 12/11/2012 °[employer]® Patient Information ©2015 [employer]. This information is not intended to replace advice given to you by your health care provider. Make sure you discuss any questions you have with your health care provider. ° ° ° °Braxton Hicks Contractions °Contractions of the uterus can occur throughout pregnancy. Contractions are not always a sign that you are in labor.  °WHAT ARE BRAXTON HICKS CONTRACTIONS?  °Contractions that occur before labor are called Braxton Hicks contractions, or false labor. Toward the end of pregnancy (32-34 weeks), these contractions can develop more often and may become more forceful. This is   because these contractions do not result in opening (dilatation) and thinning of the cervix. They are sometimes difficult to tell apart from true labor because these contractions can be forceful and people have different pain tolerances. You should not feel embarrassed if you go to the hospital with false labor. Sometimes, the  only way to tell if you are in true labor is for your health care provider to look for changes in the cervix. If there are no prenatal problems or other health problems associated with the pregnancy, it is completely safe to be sent home with false labor and await the onset of true labor. HOW CAN YOU TELL THE DIFFERENCE BETWEEN TRUE AND FALSE LABOR? False Labor  The contractions of false labor are usually shorter and not as hard as those of true labor.   The contractions are usually irregular.   The contractions are often felt in the front of the lower abdomen and in the groin.   The contractions may go away when you walk around or change positions while lying down.   The contractions get weaker and are shorter lasting as time goes on.   The contractions do not usually become progressively stronger, regular, and closer together as with true labor.  True Labor  Contractions in true labor last 30-70 seconds, become very regular, usually become more intense, and increase in frequency.   The contractions do not go away with walking.   The discomfort is usually felt in the top of the uterus and spreads to the lower abdomen and low back.   True labor can be determined by your health care provider with an exam. This will show that the cervix is dilating and getting thinner.  WHAT TO REMEMBER  Keep up with your usual exercises and follow other instructions given by your health care provider.   Take medicines as directed by your health care provider.   Keep your regular prenatal appointments.   Eat and drink lightly if you think you are going into labor.   If Braxton Hicks contractions are making you uncomfortable:   Change your position from lying down or resting to walking, or from walking to resting.   Sit and rest in a tub of warm water.   Drink 2-3 glasses of water. Dehydration may cause these contractions.   Do slow and deep breathing several times an hour.   WHEN SHOULD I SEEK IMMEDIATE MEDICAL CARE? Seek immediate medical care if:  Your contractions become stronger, more regular, and closer together.   You have fluid leaking or gushing from your vagina.   You have a fever.   You pass blood-tinged mucus.   You have vaginal bleeding.   You have continuous abdominal pain.   You have low back pain that you never had before.   You feel your baby's head pushing down and causing pelvic pressure.   Your baby is not moving as much as it used to.  Document Released: 11/08/2005 Document Revised: 11/13/2013 Document Reviewed: 08/20/2013 [facility] Patient Information 2015 [place], [place]. This information is not intended to replace advice given to you by your health care provider. Make sure you discuss any questions you have with your health care provider.

## 2015-02-20 ENCOUNTER — Ambulatory Visit (INDEPENDENT_AMBULATORY_CARE_PROVIDER_SITE_OTHER): Payer: Medicare Other | Admitting: Family Medicine

## 2015-02-20 VITALS — BP 117/76 | HR 44 | Temp 97.9°F | Wt 163.0 lb

## 2015-02-20 DIAGNOSIS — O365931 Maternal care for other known or suspected poor fetal growth, third trimester, fetus 1: Secondary | ICD-10-CM

## 2015-02-20 LAB — POCT URINALYSIS DIP (DEVICE)
BILIRUBIN URINE: NEGATIVE
Glucose, UA: NEGATIVE mg/dL
KETONES UR: NEGATIVE mg/dL
Nitrite: NEGATIVE
Protein, ur: NEGATIVE mg/dL
Specific Gravity, Urine: 1.025 (ref 1.005–1.030)
Urobilinogen, UA: 0.2 mg/dL (ref 0.0–1.0)
pH: 7 (ref 5.0–8.0)

## 2015-02-20 LAB — OB RESULTS CONSOLE GC/CHLAMYDIA
Chlamydia: NEGATIVE
Gonorrhea: NEGATIVE

## 2015-02-20 LAB — OB RESULTS CONSOLE GBS: GBS: NEGATIVE

## 2015-02-20 NOTE — Progress Notes (Signed)
US for growth and BPP @ [facility] on 4/4

## 2015-02-20 NOTE — Progress Notes (Signed)
Moderate leuks and trace hgb in urine.

## 2015-02-20 NOTE — Progress Notes (Signed)
Nutrition note: consult re: wt loss Pt has lost 5# total but has been below prepregnancy wt most of her pregnancy. Pt did gain ~2# since last visit (3/28). Pt reports eating 5-6x/d. Pt stated she has stopped eating fast food as much but no other changes that she could think of. Intake of juice/ sweet tea appears excessive. Pt is taking a gummy multivitamin. Pt reports no N&V but has some heartburn in the evenings. Pt received verbal & written education on general nutrition during pregnancy. Also provided handout on Small Frequent Meals with suggestions of mini meals that are high calorie. Encouraged protein with all meals & snacks. Encouraged PNV or 2 chewable multivitamins since gummy vitamins do not have iron. Discussed wt gain goals of 0.6#/wk. Pt agrees to consume energy dense foods with each meal & snack. Pt does not have WIC but plans to apply. Pt plans to BF. F/u in 1-2 wks Blondell RevealLaura Haik Mahoney, MS, RD, LDN, [facility] SpringsBCLC

## 2015-02-20 NOTE — Patient Instructions (Signed)
Third Trimester of Pregnancy The third trimester is from week 29 through week 42, months 7 through 9. The third trimester is a time when the fetus is growing rapidly. At the end of the ninth month, the fetus is about 20 inches in length and weighs 6-10 pounds.  BODY CHANGES Your body goes through many changes during pregnancy. The changes vary from woman to woman.   Your weight will continue to increase. You can expect to gain 25-35 pounds (11-16 kg) by the end of the pregnancy.  You may begin to get stretch marks on your hips, abdomen, and breasts.  You may urinate more often because the fetus is moving lower into your pelvis and pressing on your bladder.  You may develop or continue to have heartburn as a result of your pregnancy.  You may develop constipation because certain hormones are causing the muscles that push waste through your intestines to slow down.  You may develop hemorrhoids or swollen, bulging veins (varicose veins).  You may have pelvic pain because of the weight gain and pregnancy hormones relaxing your joints between the bones in your pelvis. Backaches may result from overexertion of the muscles supporting your posture.  You may have changes in your hair. These can include thickening of your hair, rapid growth, and changes in texture. Some women also have hair loss during or after pregnancy, or hair that feels dry or thin. Your hair will most likely return to normal after your baby is born.  Your breasts will continue to grow and be tender. A yellow discharge may leak from your breasts called colostrum.  Your belly button may stick out.  You may feel short of breath because of your expanding uterus.  You may notice the fetus "dropping," or moving lower in your abdomen.  You may have a bloody mucus discharge. This usually occurs a few days to a week before labor begins.  Your cervix becomes thin and soft (effaced) near your due date. WHAT TO EXPECT AT YOUR  PRENATAL EXAMS  You will have prenatal exams every 2 weeks until week 36. Then, you will have weekly prenatal exams. During a routine prenatal visit:  You will be weighed to make sure you and the fetus are growing normally.  Your blood pressure is taken.  Your abdomen will be measured to track your baby's growth.  The fetal heartbeat will be listened to.  Any test results from the previous visit will be discussed.  You may have a cervical check near your due date to see if you have effaced. At around 36 weeks, your caregiver will check your cervix. At the same time, your caregiver will also perform a test on the secretions of the vaginal tissue. This test is to determine if a type of bacteria, Group B streptococcus, is present. Your caregiver will explain this further. Your caregiver may ask you:  What your birth plan is.  How you are feeling.  If you are feeling the baby move.  If you have had any abnormal symptoms, such as leaking fluid, bleeding, severe headaches, or abdominal cramping.  If you have any questions. Other tests or screenings that may be performed during your third trimester include:  Blood tests that check for low iron levels (anemia).  Fetal testing to check the health, activity level, and growth of the fetus. Testing is done if you have certain medical conditions or if there are problems during the pregnancy. FALSE LABOR You may feel small, irregular contractions that   eventually go away. These are called Braxton Hicks contractions, or false labor. Contractions may last for hours, days, or even weeks before true labor sets in. If contractions come at regular intervals, intensify, or become painful, it is best to be seen by your caregiver.  SIGNS OF LABOR   Menstrual-like cramps.  Contractions that are 5 minutes apart or less.  Contractions that start on the top of the uterus and spread down to the lower abdomen and back.  A sense of increased pelvic  pressure or back pain.  A watery or bloody mucus discharge that comes from the vagina. If you have any of these signs before the 37th week of pregnancy, call your caregiver right away. You need to go to the hospital to get checked immediately. HOME CARE INSTRUCTIONS   Avoid all smoking, herbs, alcohol, and unprescribed drugs. These chemicals affect the formation and growth of the baby.  Follow your caregiver's instructions regarding medicine use. There are medicines that are either safe or unsafe to take during pregnancy.  Exercise only as directed by your caregiver. Experiencing uterine cramps is a good sign to stop exercising.  Continue to eat regular, healthy meals.  Wear a good support bra for breast tenderness.  Do not use hot tubs, steam rooms, or saunas.  Wear your seat belt at all times when driving.  Avoid raw meat, uncooked cheese, cat litter boxes, and soil used by cats. These carry germs that can cause birth defects in the baby.  Take your prenatal vitamins.  Try taking a stool softener (if your caregiver approves) if you develop constipation. Eat more high-fiber foods, such as fresh vegetables or fruit and whole grains. Drink plenty of fluids to keep your urine clear or pale yellow.  Take warm sitz baths to soothe any pain or discomfort caused by hemorrhoids. Use hemorrhoid cream if your caregiver approves.  If you develop varicose veins, wear support hose. Elevate your feet for 15 minutes, 3-4 times a day. Limit salt in your diet.  Avoid heavy lifting, wear low heal shoes, and practice good posture.  Rest a lot with your legs elevated if you have leg cramps or low back pain.  Visit your dentist if you have not gone during your pregnancy. Use a soft toothbrush to brush your teeth and be gentle when you floss.  A sexual relationship may be continued unless your caregiver directs you otherwise.  Do not travel far distances unless it is absolutely necessary and only  with the approval of your caregiver.  Take prenatal classes to understand, practice, and ask questions about the labor and delivery.  Make a trial run to the hospital.  Pack your hospital bag.  Prepare the baby's nursery.  Continue to go to all your prenatal visits as directed by your caregiver. SEEK MEDICAL CARE IF:  You are unsure if you are in labor or if your water has broken.  You have dizziness.  You have mild pelvic cramps, pelvic pressure, or nagging pain in your abdominal area.  You have persistent nausea, vomiting, or diarrhea.  You have a bad smelling vaginal discharge.  You have pain with urination. SEEK IMMEDIATE MEDICAL CARE IF:   You have a fever.  You are leaking fluid from your vagina.  You have spotting or bleeding from your vagina.  You have severe abdominal cramping or pain.  You have rapid weight loss or gain.  You have shortness of breath with chest pain.  You notice sudden or extreme swelling   of your face, hands, ankles, feet, or legs.  You have not felt your baby move in over an hour.  You have severe headaches that do not go away with medicine.  You have vision changes. Document Released: 11/02/2001 Document Revised: 11/13/2013 Document Reviewed: 01/09/2013 [employer] Patient Information 2015 [employer]. This information is not intended to replace advice given to you by your health care provider. Make sure you discuss any questions you have with your health care provider.  Breastfeeding Deciding to breastfeed is one of the best choices you can make for you and your baby. A change in hormones during pregnancy causes your breast tissue to grow and increases the number and size of your milk ducts. These hormones also allow proteins, sugars, and fats from your blood supply to make breast milk in your milk-producing glands. Hormones prevent breast milk from being released before your baby is born as well as prompt milk flow after birth. Once  breastfeeding has begun, thoughts of your baby, as well as his or her sucking or crying, can stimulate the release of milk from your milk-producing glands.  BENEFITS OF BREASTFEEDING For Your Baby  Your first milk (colostrum) helps your baby's digestive system function better.   There are antibodies in your milk that help your baby fight off infections.   Your baby has a lower incidence of asthma, allergies, and sudden infant death syndrome.   The nutrients in breast milk are better for your baby than infant formulas and are designed uniquely for your baby's needs.   Breast milk improves your baby's brain development.   Your baby is less likely to develop other conditions, such as childhood obesity, asthma, or type 2 diabetes mellitus.  For You   Breastfeeding helps to create a very special bond between you and your baby.   Breastfeeding is convenient. Breast milk is always available at the correct temperature and costs nothing.   Breastfeeding helps to burn calories and helps you lose the weight gained during pregnancy.   Breastfeeding makes your uterus contract to its prepregnancy size faster and slows bleeding (lochia) after you give birth.   Breastfeeding helps to lower your risk of developing type 2 diabetes mellitus, osteoporosis, and breast or ovarian cancer later in life. SIGNS THAT YOUR BABY IS HUNGRY Early Signs of Hunger  Increased alertness or activity.  Stretching.  Movement of the head from side to side.  Movement of the head and opening of the mouth when the corner of the mouth or cheek is stroked (rooting).  Increased sucking sounds, smacking lips, cooing, sighing, or squeaking.  Hand-to-mouth movements.  Increased sucking of fingers or hands. Late Signs of Hunger  Fussing.  Intermittent crying. Extreme Signs of Hunger Signs of extreme hunger will require calming and consoling before your baby will be able to breastfeed successfully. Do not  wait for the following signs of extreme hunger to occur before you initiate breastfeeding:   Restlessness.  A loud, strong cry.   Screaming. BREASTFEEDING BASICS Breastfeeding Initiation  Find a comfortable place to sit or lie down, with your neck and back well supported.  Place a pillow or rolled up blanket under your baby to bring him or her to the level of your breast (if you are seated). Nursing pillows are specially designed to help support your arms and your baby while you breastfeed.  Make sure that your baby's abdomen is facing your abdomen.   Gently massage your breast. With your fingertips, massage from your chest   wall toward your nipple in a circular motion. This encourages milk flow. You may need to continue this action during the feeding if your milk flows slowly.  Support your breast with 4 fingers underneath and your thumb above your nipple. Make sure your fingers are well away from your nipple and your baby's mouth.   Stroke your baby's lips gently with your finger or nipple.   When your baby's mouth is open wide enough, quickly bring your baby to your breast, placing your entire nipple and as much of the colored area around your nipple (areola) as possible into your baby's mouth.   More areola should be visible above your baby's upper lip than below the lower lip.   Your baby's tongue should be between his or her lower gum and your breast.   Ensure that your baby's mouth is correctly positioned around your nipple (latched). Your baby's lips should create a seal on your breast and be turned out (everted).  It is common for your baby to suck about 2-3 minutes in order to start the flow of breast milk. Latching Teaching your baby how to latch on to your breast properly is very important. An improper latch can cause nipple pain and decreased milk supply for you and poor weight gain in your baby. Also, if your baby is not latched onto your nipple properly, he or she  may swallow some air during feeding. This can make your baby fussy. Burping your baby when you switch breasts during the feeding can help to get rid of the air. However, teaching your baby to latch on properly is still the best way to prevent fussiness from swallowing air while breastfeeding. Signs that your baby has successfully latched on to your nipple:    Silent tugging or silent sucking, without causing you pain.   Swallowing heard between every 3-4 sucks.    Muscle movement above and in front of his or her ears while sucking.  Signs that your baby has not successfully latched on to nipple:   Sucking sounds or smacking sounds from your baby while breastfeeding.  Nipple pain. If you think your baby has not latched on correctly, slip your finger into the corner of your baby's mouth to break the suction and place it between your baby's gums. Attempt breastfeeding initiation again. Signs of Successful Breastfeeding Signs from your baby:   A gradual decrease in the number of sucks or complete cessation of sucking.   Falling asleep.   Relaxation of his or her body.   Retention of a small amount of milk in his or her mouth.   Letting go of your breast by himself or herself. Signs from you:  Breasts that have increased in firmness, weight, and size 1-3 hours after feeding.   Breasts that are softer immediately after breastfeeding.  Increased milk volume, as well as a change in milk consistency and color by the fifth day of breastfeeding.   Nipples that are not sore, cracked, or bleeding. Signs That Your Baby is Getting Enough Milk  Wetting at least 3 diapers in a 24-hour period. The urine should be clear and pale yellow by age 5 days.  At least 3 stools in a 24-hour period by age 5 days. The stool should be soft and yellow.  At least 3 stools in a 24-hour period by age 7 days. The stool should be seedy and yellow.  No loss of weight greater than 10% of birth weight  during the first 3   days of age.  Average weight gain of 4-7 ounces (113-198 g) per week after age 4 days.  Consistent daily weight gain by age 5 days, without weight loss after the age of 2 weeks. After a feeding, your baby may spit up a small amount. This is common. BREASTFEEDING FREQUENCY AND DURATION Frequent feeding will help you make more milk and can prevent sore nipples and breast engorgement. Breastfeed when you feel the need to reduce the fullness of your breasts or when your baby shows signs of hunger. This is called "breastfeeding on demand." Avoid introducing a pacifier to your baby while you are working to establish breastfeeding (the first 4-6 weeks after your baby is born). After this time you may choose to use a pacifier. Research has shown that pacifier use during the first year of a baby's life decreases the risk of sudden infant death syndrome (SIDS). Allow your baby to feed on each breast as long as he or she wants. Breastfeed until your baby is finished feeding. When your baby unlatches or falls asleep while feeding from the first breast, offer the second breast. Because newborns are often sleepy in the first few weeks of life, you may need to awaken your baby to get him or her to feed. Breastfeeding times will vary from baby to baby. However, the following rules can serve as a guide to help you ensure that your baby is properly fed:  Newborns (babies 4 weeks of age or younger) may breastfeed every 1-3 hours.  Newborns should not go longer than 3 hours during the day or 5 hours during the night without breastfeeding.  You should breastfeed your baby a minimum of 8 times in a 24-hour period until you begin to introduce solid foods to your baby at around 6 months of age. BREAST MILK PUMPING Pumping and storing breast milk allows you to ensure that your baby is exclusively fed your breast milk, even at times when you are unable to breastfeed. This is especially important if you are  going back to work while you are still breastfeeding or when you are not able to be present during feedings. Your lactation consultant can give you guidelines on how long it is safe to store breast milk.  A breast pump is a machine that allows you to pump milk from your breast into a sterile bottle. The pumped breast milk can then be stored in a refrigerator or freezer. Some breast pumps are operated by hand, while others use electricity. Ask your lactation consultant which type will work best for you. Breast pumps can be purchased, but some hospitals and breastfeeding support groups lease breast pumps on a monthly basis. A lactation consultant can teach you how to hand express breast milk, if you prefer not to use a pump.  CARING FOR YOUR BREASTS WHILE YOU BREASTFEED Nipples can become dry, cracked, and sore while breastfeeding. The following recommendations can help keep your breasts moisturized and healthy:  Avoid using soap on your nipples.   Wear a supportive bra. Although not required, special nursing bras and tank tops are designed to allow access to your breasts for breastfeeding without taking off your entire bra or top. Avoid wearing underwire-style bras or extremely tight bras.  Air dry your nipples for 3-4minutes after each feeding.   Use only cotton bra pads to absorb leaked breast milk. Leaking of breast milk between feedings is normal.   Use lanolin on your nipples after breastfeeding. Lanolin helps to maintain your skin's   normal moisture barrier. If you use pure lanolin, you do not need to wash it off before feeding your baby again. Pure lanolin is not toxic to your baby. You may also hand express a few drops of breast milk and gently massage that milk into your nipples and allow the milk to air dry. In the first few weeks after giving birth, some women experience extremely full breasts (engorgement). Engorgement can make your breasts feel heavy, warm, and tender to the touch.  Engorgement peaks within 3-5 days after you give birth. The following recommendations can help ease engorgement:  Completely empty your breasts while breastfeeding or pumping. You may want to start by applying warm, moist heat (in the shower or with warm water-soaked hand towels) just before feeding or pumping. This increases circulation and helps the milk flow. If your baby does not completely empty your breasts while breastfeeding, pump any extra milk after he or she is finished.  Wear a snug bra (nursing or regular) or tank top for 1-2 days to signal your body to slightly decrease milk production.  Apply ice packs to your breasts, unless this is too uncomfortable for you.  Make sure that your baby is latched on and positioned properly while breastfeeding. If engorgement persists after 48 hours of following these recommendations, contact your health care provider or a lactation consultant. OVERALL HEALTH CARE RECOMMENDATIONS WHILE BREASTFEEDING  Eat healthy foods. Alternate between meals and snacks, eating 3 of each per day. Because what you eat affects your breast milk, some of the foods may make your baby more irritable than usual. Avoid eating these foods if you are sure that they are negatively affecting your baby.  Drink milk, fruit juice, and water to satisfy your thirst (about 10 glasses a day).   Rest often, relax, and continue to take your prenatal vitamins to prevent fatigue, stress, and anemia.  Continue breast self-awareness checks.  Avoid chewing and smoking tobacco.  Avoid alcohol and drug use. Some medicines that may be harmful to your baby can pass through breast milk. It is important to ask your health care provider before taking any medicine, including all over-the-counter and prescription medicine as well as vitamin and herbal supplements. It is possible to become pregnant while breastfeeding. If birth control is desired, ask your health care provider about options that  will be safe for your baby. SEEK MEDICAL CARE IF:   You feel like you want to stop breastfeeding or have become frustrated with breastfeeding.  You have painful breasts or nipples.  Your nipples are cracked or bleeding.  Your breasts are red, tender, or warm.  You have a swollen area on either breast.  You have a fever or chills.  You have nausea or vomiting.  You have drainage other than breast milk from your nipples.  Your breasts do not become full before feedings by the fifth day after you give birth.  You feel sad and depressed.  Your baby is too sleepy to eat well.  Your baby is having trouble sleeping.   Your baby is wetting less than 3 diapers in a 24-hour period.  Your baby has less than 3 stools in a 24-hour period.  Your baby's skin or the white part of his or her eyes becomes yellow.   Your baby is not gaining weight by 5 days of age. SEEK IMMEDIATE MEDICAL CARE IF:   Your baby is overly tired (lethargic) and does not want to wake up and feed.  Your baby   develops an unexplained fever. Document Released: 11/08/2005 Document Revised: 11/13/2013 Document Reviewed: 05/02/2013 [employer] Patient Information 2015 [employer]. This information is not intended to replace advice given to you by your health care provider. Make sure you discuss any questions you have with your health care provider.  

## 2015-02-20 NOTE — Progress Notes (Signed)
NST reviewed and reactive. Cultures today Growth 1344 gms, < 10%, AC < 3% For IOL at 37 wks

## 2015-02-21 ENCOUNTER — Telehealth (HOSPITAL_COMMUNITY): Payer: Self-pay | Admitting: *Deleted

## 2015-02-21 ENCOUNTER — Encounter (HOSPITAL_COMMUNITY): Payer: Self-pay | Admitting: *Deleted

## 2015-02-21 DIAGNOSIS — Z3A34 34 weeks gestation of pregnancy: Secondary | ICD-10-CM | POA: Insufficient documentation

## 2015-02-21 DIAGNOSIS — O365990 Maternal care for other known or suspected poor fetal growth, unspecified trimester, not applicable or unspecified: Secondary | ICD-10-CM | POA: Insufficient documentation

## 2015-02-21 LAB — GC/CHLAMYDIA PROBE AMP
CT PROBE, AMP APTIMA: NEGATIVE
GC Probe RNA: NEGATIVE

## 2015-02-21 NOTE — Telephone Encounter (Signed)
Preadmission screen  

## 2015-02-22 LAB — CULTURE, BETA STREP (GROUP B ONLY)

## 2015-02-24 ENCOUNTER — Ambulatory Visit (HOSPITAL_COMMUNITY)
Admission: RE | Admit: 2015-02-24 | Discharge: 2015-02-24 | Disposition: A | Discharge status: Home / Self Care | Payer: Medicare Other | Source: Ambulatory Visit | Attending: Physician Assistant | Admitting: Physician Assistant

## 2015-02-24 ENCOUNTER — Encounter (HOSPITAL_COMMUNITY): Payer: Self-pay

## 2015-02-24 DIAGNOSIS — Z36 Encounter for antenatal screening of mother: Secondary | ICD-10-CM | POA: Insufficient documentation

## 2015-02-24 DIAGNOSIS — O365931 Maternal care for other known or suspected poor fetal growth, third trimester, fetus 1: Secondary | ICD-10-CM

## 2015-02-24 DIAGNOSIS — O365930 Maternal care for other known or suspected poor fetal growth, third trimester, not applicable or unspecified: Secondary | ICD-10-CM | POA: Insufficient documentation

## 2015-02-24 DIAGNOSIS — Z3A36 36 weeks gestation of pregnancy: Secondary | ICD-10-CM | POA: Diagnosis not present

## 2015-02-24 DIAGNOSIS — O289 Unspecified abnormal findings on antenatal screening of mother: Secondary | ICD-10-CM

## 2015-02-24 DIAGNOSIS — O09293 Supervision of pregnancy with other poor reproductive or obstetric history, third trimester: Secondary | ICD-10-CM | POA: Diagnosis not present

## 2015-02-24 DIAGNOSIS — Z8751 Personal history of pre-term labor: Secondary | ICD-10-CM

## 2015-02-26 ENCOUNTER — Telehealth: Payer: Self-pay | Admitting: *Deleted

## 2015-02-26 NOTE — Telephone Encounter (Signed)
Pt called to front desk and refused to leave message on nurse line. I spoke with her and she is concerned about some cramps that she is experiencing. Patient states that it has been going on since last night. Contractions are not regular. Baby is moving well, no bleeding, discharge or leakage of fluid. Advised patient if any of the above happen she should come to [facility] for evaluation. Otherwise she should take tylenol and increase water intake. She has an appointment in the morning and MD will evaluate at that time. Patient had no further questions or concerns.

## 2015-02-27 ENCOUNTER — Ambulatory Visit (INDEPENDENT_AMBULATORY_CARE_PROVIDER_SITE_OTHER): Payer: Medicare Other | Admitting: Obstetrics and Gynecology

## 2015-02-27 ENCOUNTER — Telehealth: Payer: Self-pay | Admitting: General Practice

## 2015-02-27 VITALS — BP 121/66 | HR 52 | Wt 166.0 lb

## 2015-02-27 DIAGNOSIS — O09213 Supervision of pregnancy with history of pre-term labor, third trimester: Secondary | ICD-10-CM | POA: Diagnosis not present

## 2015-02-27 DIAGNOSIS — O289 Unspecified abnormal findings on antenatal screening of mother: Secondary | ICD-10-CM

## 2015-02-27 DIAGNOSIS — O365993 Maternal care for other known or suspected poor fetal growth, unspecified trimester, fetus 3: Secondary | ICD-10-CM

## 2015-02-27 DIAGNOSIS — O280 Abnormal hematological finding on antenatal screening of mother: Secondary | ICD-10-CM

## 2015-02-27 DIAGNOSIS — O365991 Maternal care for other known or suspected poor fetal growth, unspecified trimester, fetus 1: Secondary | ICD-10-CM

## 2015-02-27 DIAGNOSIS — O365931 Maternal care for other known or suspected poor fetal growth, third trimester, fetus 1: Secondary | ICD-10-CM

## 2015-02-27 DIAGNOSIS — O09212 Supervision of pregnancy with history of pre-term labor, second trimester: Secondary | ICD-10-CM

## 2015-02-27 DIAGNOSIS — O0933 Supervision of pregnancy with insufficient antenatal care, third trimester: Secondary | ICD-10-CM

## 2015-02-27 LAB — POCT URINALYSIS DIP (DEVICE)
Glucose, UA: NEGATIVE mg/dL
Ketones, ur: NEGATIVE mg/dL
Nitrite: NEGATIVE
Protein, ur: 30 mg/dL — AB
Urobilinogen, UA: 0.2 mg/dL (ref 0.0–1.0)
pH: 6 (ref 5.0–8.0)

## 2015-02-27 MED ORDER — VALACYCLOVIR HCL 1 G PO TABS: 1000.0000 mg | ORAL_TABLET | Freq: Every day | ORAL | Status: DC

## 2015-02-27 NOTE — Progress Notes (Signed)
Small bilirubin, trace hgb and mod leuks in urine.

## 2015-02-27 NOTE — Telephone Encounter (Signed)
Patient called and left message stating she wants to talk to a nurse about her UA results. Called patient back and she states she was just curious as to why her urine came back for so much stuff like protein. States last time she had protein in her urine with her last pregnancy she ended up having pre-eclampsia. Told patient she is not experiencing any urinary symptoms or problems which is a good thing. Told patient it can be influenced by recent diet or hydration. Told patient that with pre-eclampsia there are a lot of other things going on like high blood pressure not just protein in the urine. Told patient she had a very small amount of protein in her urine. Patient verbalized understanding and had no other questions

## 2015-02-27 NOTE — Progress Notes (Signed)
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

## 2015-02-27 NOTE — Progress Notes (Signed)
Patient is doing well without complaints. FM/labor precautions reviewed. 4/4 ultrasound reviewed with patient. Patient scheduled for IOL at 37 weeks on Sunday. All questions answered. RX Valtrex provided NST reviewed and reactive

## 2015-02-27 NOTE — Progress Notes (Signed)
Nutrition note: f/u re: wt loss Pt has gained 3# since last week & still at a total loss of 2#. Pt stated she feels like she is still eating the same amount but did eat more fast food this past week. Pt reports she is being induced this Sunday. Encouraged pt to decrease juice/ sugar-sweetened beverages. [facility]. Blondell RevealLaura Armida Vickroy, MS, RD, LDN, [facility]

## 2015-03-02 ENCOUNTER — Encounter (HOSPITAL_COMMUNITY): Payer: Self-pay

## 2015-03-02 ENCOUNTER — Inpatient Hospital Stay (HOSPITAL_COMMUNITY): Payer: Medicare Other | Admitting: Anesthesiology

## 2015-03-02 ENCOUNTER — Inpatient Hospital Stay (HOSPITAL_COMMUNITY)
Admission: RE | Admit: 2015-03-02 | Discharge: 2015-03-05 | DRG: 765 | Disposition: A | Discharge status: Home / Self Care | Payer: Medicare Other | Source: Ambulatory Visit | Attending: Family Medicine | Admitting: Family Medicine

## 2015-03-02 ENCOUNTER — Encounter (HOSPITAL_COMMUNITY)
Admission: RE | Disposition: A | Discharge status: Home / Self Care | Payer: Self-pay | Source: Ambulatory Visit | Attending: Family Medicine

## 2015-03-02 VITALS — BP 139/92 | HR 45 | Temp 98.5°F | Resp 16 | Ht 64.0 in | Wt 166.0 lb

## 2015-03-02 DIAGNOSIS — Z3A37 37 weeks gestation of pregnancy: Secondary | ICD-10-CM | POA: Diagnosis not present

## 2015-03-02 DIAGNOSIS — O9902 Anemia complicating childbirth: Secondary | ICD-10-CM | POA: Diagnosis present

## 2015-03-02 DIAGNOSIS — F319 Bipolar disorder, unspecified: Secondary | ICD-10-CM | POA: Diagnosis present

## 2015-03-02 DIAGNOSIS — F419 Anxiety disorder, unspecified: Secondary | ICD-10-CM | POA: Diagnosis present

## 2015-03-02 DIAGNOSIS — Z302 Encounter for sterilization: Secondary | ICD-10-CM

## 2015-03-02 DIAGNOSIS — O99344 Other mental disorders complicating childbirth: Secondary | ICD-10-CM | POA: Diagnosis present

## 2015-03-02 DIAGNOSIS — O9822 Gonorrhea complicating childbirth: Secondary | ICD-10-CM | POA: Diagnosis present

## 2015-03-02 DIAGNOSIS — Z87891 Personal history of nicotine dependence: Secondary | ICD-10-CM | POA: Diagnosis not present

## 2015-03-02 DIAGNOSIS — O365930 Maternal care for other known or suspected poor fetal growth, third trimester, not applicable or unspecified: Secondary | ICD-10-CM | POA: Diagnosis present

## 2015-03-02 DIAGNOSIS — O9882 Other maternal infectious and parasitic diseases complicating childbirth: Secondary | ICD-10-CM | POA: Diagnosis present

## 2015-03-02 DIAGNOSIS — J45909 Unspecified asthma, uncomplicated: Secondary | ICD-10-CM | POA: Diagnosis present

## 2015-03-02 DIAGNOSIS — O9952 Diseases of the respiratory system complicating childbirth: Secondary | ICD-10-CM | POA: Diagnosis present

## 2015-03-02 DIAGNOSIS — L299 Pruritus, unspecified: Secondary | ICD-10-CM

## 2015-03-02 LAB — TYPE AND SCREEN
ABO/RH(D): B POS
Antibody Screen: NEGATIVE

## 2015-03-02 LAB — RPR: RPR: NONREACTIVE

## 2015-03-02 LAB — CBC
HEMATOCRIT: 35.7 % — AB (ref 36.0–46.0)
Hemoglobin: 12.6 g/dL (ref 12.0–15.0)
MCH: 32.6 pg (ref 26.0–34.0)
MCHC: 35.3 g/dL (ref 30.0–36.0)
MCV: 92.2 fL (ref 78.0–100.0)
Platelets: 141 10*3/uL — ABNORMAL LOW (ref 150–400)
RBC: 3.87 MIL/uL (ref 3.87–5.11)
RDW: 13.2 % (ref 11.5–15.5)
WBC: 7.7 10*3/uL (ref 4.0–10.5)

## 2015-03-02 SURGERY — Surgical Case
Anesthesia: Regional

## 2015-03-02 MED ORDER — SENNOSIDES-DOCUSATE SODIUM 8.6-50 MG PO TABS
2.0000 | ORAL_TABLET | ORAL | Status: DC
  Administered 2015-03-02 – 2015-03-04 (×3): 2 via ORAL
  Filled 2015-03-02 (×3): qty 2

## 2015-03-02 MED ORDER — OXYCODONE-ACETAMINOPHEN 5-325 MG PO TABS
1.0000 | ORAL_TABLET | ORAL | Status: DC | PRN
  Administered 2015-03-02 – 2015-03-05 (×8): 1 via ORAL
  Filled 2015-03-02 (×8): qty 1

## 2015-03-02 MED ORDER — 0.9 % SODIUM CHLORIDE (POUR BTL) OPTIME
TOPICAL | Status: DC | PRN
  Administered 2015-03-02: 100 mL

## 2015-03-02 MED ORDER — ONDANSETRON HCL 4 MG/2ML IJ SOLN
INTRAMUSCULAR | Status: AC
  Filled 2015-03-02: qty 2

## 2015-03-02 MED ORDER — LACTATED RINGERS IV SOLN
500.0000 mL | Freq: Once | INTRAVENOUS | Status: AC
  Administered 2015-03-02: 500 mL via INTRAVENOUS

## 2015-03-02 MED ORDER — FENTANYL CITRATE 0.05 MG/ML IJ SOLN
INTRAMUSCULAR | Status: DC | PRN
  Administered 2015-03-02 (×2): 50 ug via INTRAVENOUS
  Administered 2015-03-02: 100 ug via INTRAVENOUS
  Administered 2015-03-02 (×3): 50 ug via INTRAVENOUS

## 2015-03-02 MED ORDER — SIMETHICONE 80 MG PO CHEW
80.0000 mg | CHEWABLE_TABLET | ORAL | Status: DC | PRN
  Administered 2015-03-04: 80 mg via ORAL
  Filled 2015-03-02 (×3): qty 1

## 2015-03-02 MED ORDER — DIPHENHYDRAMINE HCL 50 MG/ML IJ SOLN: 12.5000 mg | INTRAMUSCULAR | Status: DC | PRN

## 2015-03-02 MED ORDER — PHENYLEPHRINE 40 MCG/ML (10ML) SYRINGE FOR IV PUSH (FOR BLOOD PRESSURE SUPPORT)
PREFILLED_SYRINGE | INTRAVENOUS | Status: AC
  Filled 2015-03-02: qty 10

## 2015-03-02 MED ORDER — PROPOFOL 10 MG/ML IV BOLUS
INTRAVENOUS | Status: AC
  Filled 2015-03-02: qty 20

## 2015-03-02 MED ORDER — FENTANYL CITRATE 0.05 MG/ML IJ SOLN
INTRAMUSCULAR | Status: AC
  Filled 2015-03-02: qty 5

## 2015-03-02 MED ORDER — EPHEDRINE 5 MG/ML INJ: 10.0000 mg | INTRAVENOUS | Status: DC | PRN

## 2015-03-02 MED ORDER — FENTANYL 2.5 MCG/ML BUPIVACAINE 1/10 % EPIDURAL INFUSION (WH - ANES): 14.0000 mL/h | INTRAMUSCULAR | Status: DC | PRN

## 2015-03-02 MED ORDER — BUPIVACAINE HCL (PF) 0.25 % IJ SOLN
INTRAMUSCULAR | Status: DC | PRN
  Administered 2015-03-02: 18 mL
  Administered 2015-03-02: 2 mL

## 2015-03-02 MED ORDER — LIDOCAINE HCL (CARDIAC) 20 MG/ML IV SOLN
INTRAVENOUS | Status: DC | PRN
  Administered 2015-03-02: 40 mg via INTRAVENOUS

## 2015-03-02 MED ORDER — PROMETHAZINE HCL 25 MG/ML IJ SOLN: 6.2500 mg | INTRAMUSCULAR | Status: DC | PRN

## 2015-03-02 MED ORDER — LACTATED RINGERS IV SOLN
INTRAVENOUS | Status: DC
  Administered 2015-03-02 (×2): via INTRAVENOUS

## 2015-03-02 MED ORDER — PHENYLEPHRINE 8 MG IN D5W 100 ML (0.08MG/ML) PREMIX OPTIME
INJECTION | INTRAVENOUS | Status: AC
  Filled 2015-03-02: qty 100

## 2015-03-02 MED ORDER — ONDANSETRON HCL 4 MG/2ML IJ SOLN: 4.0000 mg | Freq: Four times a day (QID) | INTRAMUSCULAR | Status: DC | PRN

## 2015-03-02 MED ORDER — ACETAMINOPHEN 325 MG PO TABS: 650.0000 mg | ORAL_TABLET | ORAL | Status: DC | PRN

## 2015-03-02 MED ORDER — OXYTOCIN 40 UNITS IN LACTATED RINGERS INFUSION - SIMPLE MED
1.0000 m[IU]/min | INTRAVENOUS | Status: DC
  Administered 2015-03-02: 2 m[IU]/min via INTRAVENOUS
  Filled 2015-03-02: qty 1000

## 2015-03-02 MED ORDER — LANOLIN HYDROUS EX OINT: 1.0000 "application " | TOPICAL_OINTMENT | CUTANEOUS | Status: DC | PRN

## 2015-03-02 MED ORDER — ONDANSETRON HCL 4 MG/2ML IJ SOLN
INTRAMUSCULAR | Status: DC | PRN
  Administered 2015-03-02: 4 mg via INTRAVENOUS

## 2015-03-02 MED ORDER — OXYTOCIN BOLUS FROM INFUSION: 500.0000 mL | INTRAVENOUS | Status: DC

## 2015-03-02 MED ORDER — SODIUM BICARBONATE 8.4 % IV SOLN
INTRAVENOUS | Status: AC
  Filled 2015-03-02: qty 50

## 2015-03-02 MED ORDER — MEASLES, MUMPS & RUBELLA VAC ~~LOC~~ INJ: 0.5000 mL | INJECTION | Freq: Once | SUBCUTANEOUS | Status: DC

## 2015-03-02 MED ORDER — HYDROMORPHONE HCL 1 MG/ML IJ SOLN
INTRAMUSCULAR | Status: AC
  Filled 2015-03-02: qty 1

## 2015-03-02 MED ORDER — MIDAZOLAM HCL 2 MG/2ML IJ SOLN
INTRAMUSCULAR | Status: DC | PRN
  Administered 2015-03-02: 2 mg via INTRAVENOUS

## 2015-03-02 MED ORDER — MORPHINE SULFATE 0.5 MG/ML IJ SOLN
INTRAMUSCULAR | Status: AC
  Filled 2015-03-02: qty 10

## 2015-03-02 MED ORDER — OXYTOCIN 10 UNIT/ML IJ SOLN
40.0000 [IU] | INTRAVENOUS | Status: DC | PRN
  Administered 2015-03-02: 40 [IU] via INTRAVENOUS

## 2015-03-02 MED ORDER — MIDAZOLAM HCL 2 MG/2ML IJ SOLN
INTRAMUSCULAR | Status: AC
  Filled 2015-03-02: qty 2

## 2015-03-02 MED ORDER — WITCH HAZEL-GLYCERIN EX PADS: 1.0000 "application " | MEDICATED_PAD | CUTANEOUS | Status: DC | PRN

## 2015-03-02 MED ORDER — OXYCODONE-ACETAMINOPHEN 5-325 MG PO TABS: 1.0000 | ORAL_TABLET | ORAL | Status: DC | PRN

## 2015-03-02 MED ORDER — IBUPROFEN 600 MG PO TABS
600.0000 mg | ORAL_TABLET | Freq: Four times a day (QID) | ORAL | Status: DC
  Administered 2015-03-02 – 2015-03-05 (×13): 600 mg via ORAL
  Filled 2015-03-02 (×13): qty 1

## 2015-03-02 MED ORDER — BUPIVACAINE LIPOSOME 1.3 % IJ SUSP
20.0000 mL | Freq: Once | INTRAMUSCULAR | Status: AC
  Administered 2015-03-02: 20 mL
  Filled 2015-03-02: qty 20

## 2015-03-02 MED ORDER — FENTANYL CITRATE 0.05 MG/ML IJ SOLN
25.0000 ug | INTRAMUSCULAR | Status: DC | PRN
  Administered 2015-03-02 (×2): 50 ug via INTRAVENOUS

## 2015-03-02 MED ORDER — DEXTROSE IN LACTATED RINGERS 5 % IV SOLN: INTRAVENOUS | Status: DC

## 2015-03-02 MED ORDER — OXYTOCIN 40 UNITS IN LACTATED RINGERS INFUSION - SIMPLE MED: 62.5000 mL/h | INTRAVENOUS | Status: DC

## 2015-03-02 MED ORDER — FENTANYL CITRATE 0.05 MG/ML IJ SOLN
INTRAMUSCULAR | Status: AC
  Filled 2015-03-02: qty 2

## 2015-03-02 MED ORDER — OXYCODONE-ACETAMINOPHEN 5-325 MG PO TABS
2.0000 | ORAL_TABLET | ORAL | Status: DC | PRN
  Administered 2015-03-02: 1 via ORAL
  Administered 2015-03-02 – 2015-03-05 (×3): 2 via ORAL
  Filled 2015-03-02 (×4): qty 2

## 2015-03-02 MED ORDER — LIDOCAINE HCL (PF) 1 % IJ SOLN: 30.0000 mL | INTRAMUSCULAR | Status: DC | PRN

## 2015-03-02 MED ORDER — ZOLPIDEM TARTRATE 5 MG PO TABS: 5.0000 mg | ORAL_TABLET | Freq: Every evening | ORAL | Status: DC | PRN

## 2015-03-02 MED ORDER — SIMETHICONE 80 MG PO CHEW
80.0000 mg | CHEWABLE_TABLET | Freq: Three times a day (TID) | ORAL | Status: DC
  Administered 2015-03-02 – 2015-03-05 (×11): 80 mg via ORAL
  Filled 2015-03-02 (×7): qty 1

## 2015-03-02 MED ORDER — SCOPOLAMINE 1 MG/3DAYS TD PT72
MEDICATED_PATCH | TRANSDERMAL | Status: AC
  Filled 2015-03-02: qty 1

## 2015-03-02 MED ORDER — DIPHENHYDRAMINE HCL 25 MG PO CAPS: 25.0000 mg | ORAL_CAPSULE | Freq: Four times a day (QID) | ORAL | Status: DC | PRN

## 2015-03-02 MED ORDER — PHENYLEPHRINE 40 MCG/ML (10ML) SYRINGE FOR IV PUSH (FOR BLOOD PRESSURE SUPPORT): 80.0000 ug | PREFILLED_SYRINGE | INTRAVENOUS | Status: DC | PRN

## 2015-03-02 MED ORDER — OXYCODONE-ACETAMINOPHEN 5-325 MG PO TABS: 2.0000 | ORAL_TABLET | ORAL | Status: DC | PRN

## 2015-03-02 MED ORDER — CHLOROPROCAINE HCL 3 % IJ SOLN
INTRAMUSCULAR | Status: AC
  Filled 2015-03-02: qty 20

## 2015-03-02 MED ORDER — HYDROMORPHONE HCL 1 MG/ML IJ SOLN
INTRAMUSCULAR | Status: DC | PRN
  Administered 2015-03-02: 1 mg via INTRAVENOUS

## 2015-03-02 MED ORDER — DEXAMETHASONE SODIUM PHOSPHATE 10 MG/ML IJ SOLN
INTRAMUSCULAR | Status: DC | PRN
  Administered 2015-03-02: 5 mg via INTRAVENOUS

## 2015-03-02 MED ORDER — PRENATAL MULTIVITAMIN CH
1.0000 | ORAL_TABLET | Freq: Every day | ORAL | Status: DC
  Administered 2015-03-03 – 2015-03-05 (×3): 1 via ORAL
  Filled 2015-03-02 (×3): qty 1

## 2015-03-02 MED ORDER — MENTHOL 3 MG MT LOZG: 1.0000 | LOZENGE | OROMUCOSAL | Status: DC | PRN

## 2015-03-02 MED ORDER — PROPOFOL 10 MG/ML IV BOLUS
INTRAVENOUS | Status: DC | PRN
  Administered 2015-03-02: 50 mg via INTRAVENOUS
  Administered 2015-03-02: 150 mg via INTRAVENOUS
  Administered 2015-03-02: 50 mg via INTRAVENOUS

## 2015-03-02 MED ORDER — CITRIC ACID-SODIUM CITRATE 334-500 MG/5ML PO SOLN
30.0000 mL | ORAL | Status: DC | PRN
  Administered 2015-03-02: 30 mL via ORAL
  Filled 2015-03-02: qty 15

## 2015-03-02 MED ORDER — DIBUCAINE 1 % RE OINT: 1.0000 "application " | TOPICAL_OINTMENT | RECTAL | Status: DC | PRN

## 2015-03-02 MED ORDER — TETANUS-DIPHTH-ACELL PERTUSSIS 5-2.5-18.5 LF-MCG/0.5 IM SUSP: 0.5000 mL | Freq: Once | INTRAMUSCULAR | Status: DC

## 2015-03-02 MED ORDER — FLEET ENEMA 7-19 GM/118ML RE ENEM: 1.0000 | ENEMA | RECTAL | Status: DC | PRN

## 2015-03-02 MED ORDER — SUCCINYLCHOLINE CHLORIDE 20 MG/ML IJ SOLN
INTRAMUSCULAR | Status: DC | PRN
  Administered 2015-03-02: 100 mg via INTRAVENOUS

## 2015-03-02 MED ORDER — TERBUTALINE SULFATE 1 MG/ML IJ SOLN
0.2500 mg | Freq: Once | INTRAMUSCULAR | Status: DC | PRN
  Filled 2015-03-02: qty 1

## 2015-03-02 MED ORDER — TERBUTALINE SULFATE 1 MG/ML IJ SOLN: 0.2500 mg | Freq: Once | INTRAMUSCULAR | Status: DC | PRN

## 2015-03-02 MED ORDER — MISOPROSTOL 25 MCG QUARTER TABLET
25.0000 ug | ORAL_TABLET | ORAL | Status: DC
  Administered 2015-03-02: 25 ug via VAGINAL
  Filled 2015-03-02: qty 0.25

## 2015-03-02 MED ORDER — OXYTOCIN 10 UNIT/ML IJ SOLN
INTRAMUSCULAR | Status: AC
  Filled 2015-03-02: qty 4

## 2015-03-02 MED ORDER — SIMETHICONE 80 MG PO CHEW
80.0000 mg | CHEWABLE_TABLET | ORAL | Status: DC
  Administered 2015-03-02 – 2015-03-03 (×2): 80 mg via ORAL
  Filled 2015-03-02 (×5): qty 1

## 2015-03-02 MED ORDER — MEPERIDINE HCL 25 MG/ML IJ SOLN
6.2500 mg | INTRAMUSCULAR | Status: DC | PRN
Start: 2015-03-02 — End: 2015-03-02

## 2015-03-02 MED ORDER — CEFAZOLIN SODIUM-DEXTROSE 2-3 GM-% IV SOLR
INTRAVENOUS | Status: DC | PRN
  Administered 2015-03-02: 2 g via INTRAVENOUS

## 2015-03-02 MED ORDER — OXYTOCIN 40 UNITS IN LACTATED RINGERS INFUSION - SIMPLE MED: 62.5000 mL/h | INTRAVENOUS | Status: AC

## 2015-03-02 MED ORDER — LACTATED RINGERS IV SOLN: 500.0000 mL | INTRAVENOUS | Status: DC | PRN

## 2015-03-02 SURGICAL SUPPLY — 36 items
APL SKNCLS STERI-STRIP NONHPOA (GAUZE/BANDAGES/DRESSINGS) ×1
BENZOIN TINCTURE PRP APPL 2/3 (GAUZE/BANDAGES/DRESSINGS) ×2 IMPLANT
CLAMP CORD UMBIL (MISCELLANEOUS) IMPLANT
CLIP FILSHIE TUBAL LIGA STRL (Clip) ×2 IMPLANT
CLOSURE WOUND 1/2 X4 (GAUZE/BANDAGES/DRESSINGS) ×1
CLOTH BEACON ORANGE TIMEOUT ST (SAFETY) ×3 IMPLANT
DRAPE SHEET LG 3/4 BI-LAMINATE (DRAPES) IMPLANT
DRSG OPSITE POSTOP 4X10 (GAUZE/BANDAGES/DRESSINGS) ×3 IMPLANT
DURAPREP 26ML APPLICATOR (WOUND CARE) ×3 IMPLANT
ELECT REM PT RETURN 9FT ADLT (ELECTROSURGICAL) ×3
ELECTRODE REM PT RTRN 9FT ADLT (ELECTROSURGICAL) ×1 IMPLANT
EXTRACTOR VACUUM M CUP 4 TUBE (SUCTIONS) IMPLANT
EXTRACTOR VACUUM M CUP 4' TUBE (SUCTIONS)
GAUZE SPONGE 4X4 12PLY STRL (GAUZE/BANDAGES/DRESSINGS) ×2 IMPLANT
GLOVE BIOGEL PI IND STRL 7.0 (GLOVE) ×1 IMPLANT
GLOVE BIOGEL PI INDICATOR 7.0 (GLOVE) ×2
GLOVE ECLIPSE 7.0 STRL STRAW (GLOVE) ×6 IMPLANT
GOWN STRL REUS W/TWL LRG LVL3 (GOWN DISPOSABLE) ×6 IMPLANT
KIT ABG SYR 3ML LUER SLIP (SYRINGE) IMPLANT
NDL HYPO 25X5/8 SAFETYGLIDE (NEEDLE) IMPLANT
NEEDLE HYPO 22GX1.5 SAFETY (NEEDLE) ×3 IMPLANT
NEEDLE HYPO 25X5/8 SAFETYGLIDE (NEEDLE) IMPLANT
NS IRRIG 1000ML POUR BTL (IV SOLUTION) ×3 IMPLANT
PACK C SECTION WH (CUSTOM PROCEDURE TRAY) ×3 IMPLANT
PAD ABD 7.5X8 STRL (GAUZE/BANDAGES/DRESSINGS) ×2 IMPLANT
PAD OB MATERNITY 4.3X12.25 (PERSONAL CARE ITEMS) ×3 IMPLANT
RTRCTR C-SECT PINK 25CM LRG (MISCELLANEOUS) ×3 IMPLANT
STAPLER VISISTAT 35W (STAPLE) IMPLANT
STRIP CLOSURE SKIN 1/2X4 (GAUZE/BANDAGES/DRESSINGS) ×1 IMPLANT
SUT VIC AB 0 CTX 36 (SUTURE) ×9
SUT VIC AB 0 CTX36XBRD ANBCTRL (SUTURE) ×3 IMPLANT
SUT VIC AB 4-0 KS 27 (SUTURE) IMPLANT
SYR 30ML LL (SYRINGE) ×3 IMPLANT
TAPE CLOTH SURG 4X10 WHT LF (GAUZE/BANDAGES/DRESSINGS) ×2 IMPLANT
TOWEL OR 17X24 6PK STRL BLUE (TOWEL DISPOSABLE) ×3 IMPLANT
TRAY FOLEY CATH SILVER 14FR (SET/KITS/TRAYS/PACK) ×3 IMPLANT

## 2015-03-02 NOTE — Progress Notes (Signed)
TO o.r.

## 2015-03-02 NOTE — Op Note (Signed)
Preoperative Diagnosis:  IUP @ 4772w0d, IUGR, fetal intolerance of labor  Postoperative Diagnosis:  Same, Placental Abruption  Procedure: Primary low transverse cesarean section, Bilateral Tubal Ligation with Filshie clips  Surgeon: Tinnie Gensanya Pratt, M.D.  Assistant: None  Anesthesia: GETT with Sebastian Acheheodore Manny, MD  Findings: Viable female infant, APGAR (1 MIN): 8   APGAR (5 MINS): 9, Cord pH 7.11 Normal tubes and ovaries  Estimated blood loss: 1000 cc  Complications: None known  Specimens: Placenta to pathology  Reason for procedure: Briefly, the patient is a 29 y.o. [id number] at 3472w0d with Induction of labor for IUGR with fetal bradycardia, which recovered but  variable decels to the 50's with each contraction and was on 2 cm dilated. She desired permanent sterility and had signed BTL papers previously. We discussed BTL prior to surgery in the room.    Procedure: Patient is a to the OR where spinal analgesia was administered. She was then placed in a supine position with left lateral tilt. She received 2 g of Ancef at cord clamp and SCDs were in place. A Foley catheter was placed in the bladder. She was prepped with splash of betadine due to FHR in the 50's. A timeout was not performed due to emergency of case. A knife was then used to make a Pfannenstiel incision. This incision was carried out to underlying fascia which was divided in the midline with the knife. The incision was extended laterally, sharply. The rectus was divided in the midline. The peritoneal cavity was entered bluntly. Bladder blade placed. A knife was used to make a low transverse incision on the uterus. This incision was carried down to the amniotic cavity was entered. Fetus was in LOA position and was brought up out of the incision without difficulty. Cord was clamped x 2 and cut. Infant taken to waiting pediatrician. Cord pH and  Cord blood were obtained. Placenta was delivered from the uterus. Uterus was cleaned with dry lap  pads. Alexis retractor placed in abdomen. Uterine incision closed with 0 Vicryl suture in a locked running fashion. Attention was turned to the pt's left tube which was grasped with a Babcock clamp and followed to its fimbriated end. A Filshie clip was placed across the tube 1.5 cm from the cornu.  Attention was turned to the pt's right tube which was grasped with a Babcock clamp and followed to its fimbriated end.  A Filshie clip was placed across the tube 1.5 cm from the cornu. A 1cc of 0.25% Marcaine was injected into the surrounding tubes bilaterally.  Alexis retractor was removed from the abdomen. Peritoneal closure was done with 0 Vicryl suture.  Fascia is closed with 0 Vicryl suture in a running fashion. Subcutaneous tissue infused with a mixture of 0.25% Marcaine and Exparel. Skin closed using 3-0 Vicryl on a Keith needle. Steri strips applied, followed by pressure dressing.  All instrument, needle and lap counts were correct x 2. Patient was awake and taken to PACU stable. Infant to NICU, stable.

## 2015-03-02 NOTE — Progress Notes (Signed)
Tammy Cole is a 29 y.o. [phone] at 7076w0d by ultrasound admitted for induction of labor due to Poor fetal growth.  Subjective:   Objective: BP 134/75 mmHg  Pulse 40  Temp(Src) 98.8 F (37.1 C) (Oral)  Resp 16  Ht 5\' 4"  (1.626 m)  Wt 165 lb (74.844 kg)  BMI 28.31 kg/m2  LMP 06/16/2014      FHT:  FHR: 155 bpm, variability: moderate,  accelerations:  Present,  decelerations:  Present variable UC:   irregular, every 2-10 minutes SVE:   Dilation: 1 Effacement (%): 30 Exam by:: Dr Richarda BladeAdamo  Labs: Lab Results  Component Value Date   WBC 7.7 03/02/2015   HGB 12.6 03/02/2015   HCT 35.7* 03/02/2015   MCV 92.2 03/02/2015   PLT 141* 03/02/2015    Assessment / Plan: Induction of labor due to IUGR,  progressing well on pitocin  Labor: cervical ripening Preeclampsia:  no signs or symptoms of toxicity Fetal Wellbeing:  Category II Pain Control:  Labor support without medications I/D:  n/a Anticipated MOD:  NSVD  Jhana Giarratano DARLENE 03/02/2015, 12:32 PM

## 2015-03-02 NOTE — Anesthesia Postprocedure Evaluation (Signed)
  Anesthesia Post-op Note  Patient: Tammy Cole  Procedure(s) Performed: Procedure(s): CESAREAN SECTION (N/A)  Patient Location: PACU  Anesthesia Type:General  Level of Consciousness: awake, alert  and oriented  Airway and Oxygen Therapy: Patient Spontanous Breathing and Patient connected to nasal cannula oxygen  Post-op Pain: mild  Post-op Assessment: Post-op Vital signs reviewed, Patient's Cardiovascular Status Stable, Respiratory Function Stable, Patent Airway and No signs of Nausea or vomiting  Post-op Vital Signs: Reviewed and stable  Last Vitals:  Filed Vitals:   03/02/15 1545  BP: 128/78  Pulse: 67  Temp:   Resp: 20    Complications: No apparent anesthesia complications

## 2015-03-02 NOTE — Progress Notes (Signed)
 cytotecnserted vaginally

## 2015-03-02 NOTE — Anesthesia Procedure Notes (Signed)
Procedure Name: Intubation Date/Time: 03/02/2015 2:32 PM Performed by: Loann Chahal G Pre-anesthesia Checklist: Patient identified, Patient being monitored, Suction available and Emergency Drugs available Patient Re-evaluated:Patient Re-evaluated prior to inductionOxygen Delivery Method: Circle system utilized Preoxygenation: Pre-oxygenation with 100% oxygen Intubation Type: IV induction, Rapid sequence and Cricoid Pressure applied Laryngoscope Size: Mac and 3 (MDA performed intubation) Grade View: Grade I Tube type: Oral Tube size: 7.0 mm Number of attempts: 1 Airway Equipment and Method: Stylet Placement Confirmation: ETT inserted through vocal cords under direct vision,  breath sounds checked- equal and bilateral and positive ETCO2 Secured at: 22 cm Tube secured with: Tape

## 2015-03-02 NOTE — Transfer of Care (Signed)
Immediate Anesthesia Transfer of Care Note  Patient: Tammy Cole  Procedure(s) Performed: Procedure(s): CESAREAN SECTION (N/A)  Patient Location: PACU  Anesthesia Type:General  Level of Consciousness: awake and alert   Airway & Oxygen Therapy: Patient Spontanous Breathing and Patient connected to face mask oxygen  Post-op Assessment: Report given to RN and Post -op Vital signs reviewed and stable  Post vital signs: Reviewed and stable  Last Vitals:  Filed Vitals:   03/02/15 1341  BP: 124/79  Pulse: 46  Temp:   Resp:     Complications: No apparent anesthesia complications

## 2015-03-02 NOTE — H&P (Signed)
Tammy Cole is a 28 y.o. female presenting for induction for IUGR. No bleeding, LOF. Rare contractions are not painful or regular. Normal FM.    History OB History    Gravida Para Term Preterm AB TAB SAB Ectopic Multiple Living   Past Medical History  Diagnosis Date  . Asthma   . Anemia   . Abnormal Pap smear   . Headache(784.0)   . Onset of menses     age of 62, regular, lasting 3 to 4 days, medium to heavy flow  . Gonorrhea   . Chlamydia   . Herpes     Last outbreak August 2014  . Anxiety   . Bipolar 1 disorder   . Pregnancy induced hypertension   . Preterm labor   . Infection     UTI  . Depression     doing well  . Vaginal Pap smear, abnormal     colpo, ok since  . Pre-eclampsia    Past Surgical History  Procedure Laterality Date  . Colposcopy    . Vaginal delivery      X3  . Wisdom tooth extraction     Family History: family history includes Arthritis in an other family member; Asthma in an other family member; COPD in her father; Cataracts in her father and another family member; Diabetes in her maternal grandmother; Glaucoma in her father and another family member; Heart disease in an other family member; Hypertension in her father, maternal grandmother, and another family member; Prostate cancer in her father and another family member. Social History:  reports that she quit smoking about 2 years ago. Her smoking use included Cigarettes. She has quit using smokeless tobacco. She reports that she does not drink alcohol or use illicit drugs.   Prenatal Transfer Tool  Maternal Diabetes: No Genetic Screening: Abnormal:  Results: Elevated risk of Trisomy 21 Maternal Ultrasounds/Referrals: Abnormal:  Findings:   IUGR Fetal Ultrasounds or other Referrals:  Referred to Materal Fetal Medicine  Maternal Substance Abuse:  Yes:  Type: Marijuana Significant Maternal Medications:  None Significant Maternal Lab Results:  Lab values include: Group B  Strep negative, HBsAG positive Other Comments:  None  ROS See HPI  Dilation: 1 Effacement (%): 30 Exam by:: Tammy Cole Blood pressure 128/85, pulse 58, temperature 98.8 F (37.1 C), temperature source Oral, resp. rate 16, height  (1.626 m), weight 74.844 kg (165 lb), last menstrual period 06/16/2014, unknown if currently breastfeeding. Maternal Exam:  Uterine Assessment: Contraction frequency is rare.   Abdomen: Patient reports no abdominal tenderness. Fetal presentation: vertex  Introitus: Normal vulva. Normal vagina.  Vagina is negative for discharge.  Pelvis: adequate for delivery.   Cervix: Cervix evaluated by digital exam.     Fetal Exam Fetal Monitor Review: Mode: ultrasound.   Baseline rate: 130.  Variability: moderate (6-25 bpm).   Pattern: accelerations present and no decelerations.    Fetal State Assessment: Category I - tracings are normal.     Physical Exam  Nursing note and vitals reviewed. Constitutional: She is oriented to person, place, and time. She appears well-developed and well-nourished. No distress.  HENT:  Head: Normocephalic and atraumatic.  Eyes: Conjunctivae are normal. Right eye exhibits no discharge. Left eye exhibits no discharge. No scleral icterus.  Cardiovascular: Normal rate.   Respiratory: Effort normal. No respiratory distress.  GI: There is no tenderness.  Genitourinary: Vagina normal and uterus normal. No  vaginal discharge found.  Neurological: She is alert and oriented to person, place, and time.  Skin: Skin is warm and dry. No rash noted. She is not diaphoretic.  Psychiatric: She has a normal mood and affect. Her behavior is normal.    Prenatal labs: ABO, Rh: --/--/B POS (04/10 0725) Antibody: PENDING (04/10 0725) Rubella: 2.74 (12/08 1133) RPR: NON REAC (02/02 1008)  HBsAg: NEGATIVE (12/08 1133)  HIV: NONREACTIVE (02/02 1008)  GBS: Negative (03/31 0000)   Assessment/Plan: IOL for IUGR. Cervix not favorable. Will start  cytotec for cervical ripening.   Tammy Cole, Tammy Cole 03/02/2015, 8:28 AM    I assessed this pt and agree with above assessment.

## 2015-03-02 NOTE — Progress Notes (Signed)
Informed MD of continuing Variables.Instructed to use O2, and star pitocin

## 2015-03-02 NOTE — Anesthesia Preprocedure Evaluation (Addendum)
Anesthesia Evaluation  Patient identified by MRN, date of birth, ID band Patient awake    Reviewed: Allergy & Precautions, NPO status , Patient's Chart, lab work & pertinent test results, reviewed documented beta blocker date and time   Airway Mallampati: II   Neck ROM: Full    Dental  (+) Teeth Intact, Dental Advisory Given   Pulmonary asthma , former smoker,  breath sounds clear to auscultation        Cardiovascular hypertension, Pt. on medications Rhythm:Regular     Neuro/Psych Anxiety Depression Bipolar Disorder    GI/Hepatic negative GI ROS, Neg liver ROS, (+)     substance abuse  marijuana use,   Endo/Other    Renal/GU negative Renal ROS     Musculoskeletal   Abdominal (+) + obese,   Peds  Hematology 12/35   Anesthesia Other Findings   Reproductive/Obstetrics (+) Pregnancy 37 week IUGR,                           Anesthesia Physical Anesthesia Plan  ASA: III and emergent  Anesthesia Plan: General   Post-op Pain Management:    Induction: Intravenous  Airway Management Planned: Oral ETT  Additional Equipment:   Intra-op Plan:   Post-operative Plan: Extubation in OR  Informed Consent: I have reviewed the patients History and Physical, chart, labs and discussed the procedure including the risks, benefits and alternatives for the proposed anesthesia with the patient or authorized representative who has indicated his/her understanding and acceptance.     Plan Discussed with:   Anesthesia Plan Comments: (Multimodal pain RX, )        Anesthesia Quick Evaluation

## 2015-03-02 NOTE — Progress Notes (Signed)
After discharged from PACU went to NICU to visit baby x 30 min

## 2015-03-03 ENCOUNTER — Encounter (HOSPITAL_COMMUNITY): Payer: Self-pay | Admitting: Family Medicine

## 2015-03-03 LAB — CBC
HCT: 26.8 % — ABNORMAL LOW (ref 36.0–46.0)
HEMOGLOBIN: 9.5 g/dL — AB (ref 12.0–15.0)
MCH: 32.4 pg (ref 26.0–34.0)
MCHC: 35.4 g/dL (ref 30.0–36.0)
MCV: 91.5 fL (ref 78.0–100.0)
Platelets: 119 10*3/uL — ABNORMAL LOW (ref 150–400)
RBC: 2.93 MIL/uL — AB (ref 3.87–5.11)
RDW: 13.1 % (ref 11.5–15.5)
WBC: 11.7 10*3/uL — ABNORMAL HIGH (ref 4.0–10.5)

## 2015-03-03 NOTE — Progress Notes (Signed)
I spent time with Jolayne Panthereira as she processed her experiences over the last 24 hours with an emergency C-Section for her baby.  She is somewhat in shock still, but she is coping well and is so grateful that her baby is here after such a difficult pregnancy.  She did mention some concerns about balancing caring for her baby here as well as her children at home.  This is particularly difficult because she does not have a car.  I mentioned this concern to Lulu Ridingolleen Shaw, LCSW as well.    We will continue to follow up as we are able, but please page as needs arise.  [address] StreetChaplain Dyanne CarrelKaty Dakin Madani, Bcc Pager, [phone] 4:31 PM     03/03/15 1600  Clinical Encounter Type  Visited With Patient  Visit Type Initial  Referral From Nurse  Spiritual Encounters  Spiritual Needs Emotional

## 2015-03-03 NOTE — Lactation Note (Addendum)
This note was copied from the chart of [name]. Lactation Consultation Note  Patient Name: [name] XBJYN'WToday's Date: 03/03/2015 Reason for consult: Initial assessment NICU baby 22 hours of life. Patient's RN, Tresa EndoKelly, states that mom has just returned from taking about 5mls of EBM to NICU for baby. Mom states that she nursed her next-youngest child for over a year with no issues. Mom states that she is familiar with DEBP use. Mom has NICU booklet, and is aware of EBM storage and transport to hospital. Mom aware of pumping rooms in NICU. Enc mom to keep pumping every 3 hours and to pump after visiting baby. Mom given small bottles and stickers with instructions, and mom has EBM labels. [facility] given [facility], aware of OP/BFSG, community resources, and [facility] after [facility]. [facility] and [facility]. [facility] over as well. Enc mom to call for assistance as needed. Mom aware of LC support in NICU too.  Maternal Data Has patient been taught Hand Expression?: Yes Does the patient have breastfeeding experience prior to this delivery?: Yes  Feeding Feeding Type: Formula Length of feed: 25 min  LATCH Score/Interventions                      Lactation Tools Discussed/Used Pump Review: Setup, frequency, and cleaning;Milk Storage Initiated by:: Women's RN Date initiated:: 03/02/15   Consult Status Consult Status: Follow-up Date: 03/04/15 Follow-up type: In-patient    Geralynn OchsWILLIARD, Tammy Yankee 03/03/2015, 1:31 PM

## 2015-03-03 NOTE — Progress Notes (Signed)
Ur chart review completed.  

## 2015-03-03 NOTE — Progress Notes (Signed)
Subjective: Postpartum Day 1: Cesarean Delivery Patient reports incisional pain and tolerating PO.    Objective: Vital signs in last 24 hours: Temp:  [97.9 F (36.6 C)-98.8 F (37.1 C)] 98.5 F (36.9 C) (04/11 0200) Pulse Rate:  [40-67] 53 (04/11 0200) Resp:  [15-20] 18 (04/11 0200) BP: (114-134)/(71-84) 126/72 mmHg (04/11 0200) SpO2:  [99 %-100 %] 100 % (04/11 0200) Weight:  [166 lb (75.297 kg)] 166 lb (75.297 kg) (04/10 1707)  Physical Exam:  General: alert, cooperative, appears stated age and no distress Lochia: appropriate Uterine Fundus: firm Incision: dressing dry and intact DVT Evaluation: No evidence of DVT seen on physical exam. Negative Homan's sign. No cords or calf tenderness. No significant calf/ankle edema.   Recent Labs  03/02/15 0725  HGB 12.6  HCT 35.7*    Assessment/Plan: Status post Cesarean section. Doing well postoperatively.  Continue current care.  Wyvonnia DuskyLAWSON, MARIE DARLENE 03/03/2015, 7:19 AM
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
## 2015-03-04 NOTE — Progress Notes (Signed)
Post-Op Day #2 Subjective:  Sharlot Gowdaeira F Rockwood is a 29 y.o. [id number] 4928w0d s/p pLTCS POD #2.  No acute events overnight.  Pt denies problems with ambulating, voiding or po intake.  Pt had some nausea this morning that resolved after eating breakfast. She denies vomiting.  Pain is moderately controlled on Percocet.  She has had flatus. She has not had bowel movement.  Lochia Minimal.  Plan for birth control is bilateral tubal ligation.  Method of Feeding: Breast  Objective: Blood pressure 113/75, pulse 55, temperature 99.2 F (37.3 C), temperature source Oral, resp. rate 18, height 5\' 4"  (1.626 m), weight 75.297 kg (166 lb), last menstrual period 06/16/2014, SpO2 100 %, unknown if currently breastfeeding.  Physical Exam:  General: alert, cooperative and no distress Lochia:normal flow Chest: CTAB, normal work of breathing  Heart: RRR no m/r/g Abdomen: +BS, soft, nontender Incision: dressing dry and intact  Uterine Fundus: firm DVT Evaluation: No evidence of DVT seen on physical exam. Extremities: no edema   Recent Labs  03/02/15 0725 03/03/15 0555  HGB 12.6 9.5*  HCT 35.7* 26.8*    Assessment/Plan:  ASSESSMENT: Sharlot Gowdaeira F Kittel is a 29 y.o. [id number] 6728w0d s/p pLTCS POD #2.   Breastfeeding and Contraception BTL   LOS: 2 days   Idalys Santos-Sanchez 03/04/2015, 7:40 AM   OB fellow attestation Post Partum Day 1 I have seen and examined this patient and agree with above documentation in the student's note.   Sharlot Gowdaeira F Pannone is a 29 y.o. [phone] s/p pLTCS.  Pt denies problems with ambulating, voiding or po intake. Pain is moderately well controlled.  Plan for birth control is bilateral tubal ligation.  Method of Feeding: breast  PE:  BP 113/75 mmHg  Pulse 55  Temp(Src) 99.2 F (37.3 C) (Oral)  Resp 18  Ht 5\' 4"  (1.626 m)  Wt 166 lb (75.297 kg)  BMI 28.48 kg/m2  SpO2 100%  LMP 06/16/2014  Breastfeeding? Unknown Fundus firm  Plan for discharge: POD#3  William DaltonMcEachern, Shyniece Scripter,  MD 8:18 AM

## 2015-03-04 NOTE — Lactation Note (Signed)
This note was copied from the chart of [name]. Lactation Consultation Note  Patient Name: [name]: 03/04/2015 Reason for consult: Follow-up assessment NICU baby 47 hours of life. Mom states that her milk is coming in, and she did not pump through the night and woke up with full breasts. Enc mom to pump when breasts uncomfortable, especially while milk is coming in. Enc mom to pump at least 8 times a day, pumping every 2-3 hours, and to set an alarm and sleep 4-5 hours at night, pumping if awakened due to breast discomfort. Discussed supply and demand. Mom is aware of pumping rooms in NICU, and mom [facility]. [facility]. Enc mom to call for assistance as needed.   Maternal Data    Feeding Feeding Type: Breast Milk Length of feed: 15 min  LATCH Score/Interventions                      Lactation Tools Discussed/Used     Consult Status Consult Status: PRN    Geralynn OchsWILLIARD, Kaeo Jacome 03/04/2015, 1:38 PM

## 2015-03-05 MED ORDER — OXYCODONE-ACETAMINOPHEN 5-325 MG PO TABS: 1.0000 | ORAL_TABLET | ORAL | Status: DC | PRN

## 2015-03-05 MED ORDER — IBUPROFEN 600 MG PO TABS: 600.0000 mg | ORAL_TABLET | Freq: Four times a day (QID) | ORAL | Status: AC

## 2015-03-05 NOTE — Progress Notes (Signed)
CSW met with MOB at baby's bedside to follow up on initial assessment on 03/03/15 and to offer support.  MOB states she and baby are doing well, but that she had "an emotional night."  CSW validated and helped process MOB's feelings.  CSW provided MOB with two 31 day bus passes so that she and FOB can get back and forth to the hospital after discharge today.  She was greatly appreciative.  MOB states FOB has gone back to work (doing Customer service manager work"), but plans to visit baby with her in the afternoons.  MOB states her mother plans to stay with them for a while to help with MOB's recovery and help with the other children so she can be at the hospital with [place].  CSW questioned MOB as to whether this is a good plan, given the hx MOB has shared about her mother.  MOB states she and her mother have talked and MGM has made an agreement with MOB that she will not use around the grandchildren.  CSW stated concerns, but MOB seems assured by the discussion she and her mother had.  CSW informed MOB of baby's eligibility for SSI and informed her on how to apply.  MOB signed Request and Authorization for use/Disclosure of Protected Health Information, which is placed in baby's paper chart.  CSW provided her with a copy of baby's Admission Summary in order for her to be able to apply for SSI.  MOB states she receives SSI due to PTSD.  She states she has been getting this benefit since 29 years old.  CSW asked if MOB has a current counselor and she states she does not.  She reports that the last counselor she had was a female and that she did not feel comfortable with him.  CSW discussed the benefits of counseling, if the right counselor is found, and recommends outpatient counseling if MOB is willing.  MOB states and interest.  CSW discussed various options and MOB has decided she would like to try [facility] [place].  CSW provided contact information and informed MOB that she can walk in to initiate services.  MOB  was very appreciative of the information.  CSW provided MOB with CSW's contact information and asked her to call anytime.

## 2015-03-05 NOTE — Progress Notes (Signed)
Pt ambulated out teaching complete  pts mom will assist her at home

## 2015-03-05 NOTE — Discharge Summary (Signed)
Obstetric Discharge Summary Reason for Admission: induction of labor Prenatal Procedures: NST and ultrasound Intrapartum Procedures: cesarean: low cervical, transverse with BTL Postpartum Procedures: none Complications-Operative and Postpartum: none HEMOGLOBIN  Date Value Ref Range Status  03/03/2015 9.5* 12.0 - 15.0 g/dL Final    Comment:    DELTA CHECK NOTED REPEATED TO VERIFY    HCT  Date Value Ref Range Status  03/03/2015 26.8* 36.0 - 46.0 % Final   Hospital Course: Patient admitted to L&D for induction of labor secondary to IUGR and was given one dose of cytotec for cervical ripening.  Pitocin was then started.  Variable decelerations were noted.  After fetal heart rate dropped to the 50s, the patient was taken to the OR for an emergent cesarean section.  Bilateral tubal sterilization was preformed at that time.  The patient's post-operative course has been uncomplicated and she is being released today, POD#3.     Physical Exam:  General: alert, cooperative and no distress Lochia: appropriate Uterine Fundus: firm Incision: healing well, no significant drainage, no dehiscence DVT Evaluation: No evidence of DVT seen on physical exam. Negative Homan's sign. No cords or calf tenderness.  Discharge Diagnoses: Term Pregnancy-delivered  Discharge Information: Date: 03/05/2015 Activity: pelvic rest Diet: routine Medications: PNV, Ibuprofen and Percocet Condition: stable Instructions: refer to practice specific booklet Discharge to: home Follow-up Information    [facility] Schedule an appointment as soon as possible for a visit in 2 weeks.   Specialty:  Obstetrics and Gynecology   Contact information:   [address] [place] [id number] [phone]      Newborn Data: Live born female  Birth Weight: 3 lb 10.9 oz (1670 g) APGAR: 8, 9  In NICU  STINSON, JACOB JEHIEL 03/05/2015, 7:05 AM

## 2015-03-05 NOTE — Lactation Note (Signed)
This note was copied from the chart of Boy Carilyn Goodpastureeira Rohr. Lactation Consultation Note  Patient Name: Boy Carilyn Goodpastureeira Chaires WJXBJ'YToday's Date: 03/05/2015 Reason for consult: Follow-up assessment NICU baby 67 hours of life. Mom is getting ounces of EBM from each breast while pumping today. Mom states that she did not pump through the night, but has already pumped twice this morning. Enc mom to pump every 3 hours. Enc mom to pump at least 8 times/day, and can set alarm and sleep at night 4-5 hours, but should pump if she wakes and breasts are full/tight. Mom states that she has a personal pump at home. Enc mom to take paddles out of hospital pump, and she is aware of pumps in NICU pumping rooms. Discussed engorgement prevention/treatment. Mom aware of OP/BFSG and LC phone line assistance after D/C. Mom given large bottles with instructions for transport of EBM to hospital from home.  Maternal Data    Feeding Feeding Type: Breast Milk with Formula added Nipple Type: Slow - flow Length of feed: 30 min  LATCH Score/Interventions                      Lactation Tools Discussed/Used     Consult Status Consult Status: PRN    Geralynn OchsWILLIARD, Teruko Joswick 03/05/2015, 10:20 AM

## 2015-03-05 NOTE — Discharge Instructions (Signed)

## 2015-03-07 ENCOUNTER — Ambulatory Visit: Payer: Self-pay

## 2015-03-07 NOTE — Lactation Note (Signed)
This note was copied from the chart of Tammy Carilyn Goodpastureeira Lumadue. Lactation Consultation Note  Patient Name: Tammy Cole FAOZH'YToday's Date: 03/07/2015 Reason for consult: Follow-up assessment;NICU baby NICU baby 5 days of life. Mom c/o sore nipples. Re-fitted mom with #21 flanges and enc mom to use a thin layer of either olive or coconut oil right where flange touches outer perimeter of nipple. Mom reports increased comfort with smaller flanges. Mom is producing plenty of EBM, but has only been pumping about 4 times/day. Enc mom to pump 8 times per day, and once at night. Enc mom to call for [facility] assistance as needed.   Maternal Data    Feeding Feeding Type: Breast Milk Nipple Type: Slow - flow  LATCH Score/Interventions                      Lactation Tools Discussed/Used     Consult Status Consult Status: PRN Follow-up type: In-patient    Tammy Cole, Tammy Cole 03/07/2015, 3:04 PM

## 2015-04-10 ENCOUNTER — Telehealth: Payer: Self-pay | Admitting: General Practice

## 2015-04-10 ENCOUNTER — Ambulatory Visit: Payer: Medicare Other | Admitting: Obstetrics & Gynecology

## 2015-04-10 NOTE — Telephone Encounter (Signed)
Patient no showed for pp visit. Called patient to inform her of missed postpartum appt today and she states oh really I didn't know I had an appt. Patient would like appt rescheduled. Told patient our front office staff will contact her with a new appt. Patient verbalized understanding and had no other questions

## 2015-04-14 ENCOUNTER — Ambulatory Visit: Payer: Medicare Other | Admitting: Family Medicine

## 2016-01-23 ENCOUNTER — Emergency Department (HOSPITAL_COMMUNITY)
Admission: EM | Admit: 2016-01-23 | Discharge: 2016-01-23 | Disposition: A | Discharge status: Home / Self Care | Payer: Medicare Other | Attending: Emergency Medicine | Admitting: Emergency Medicine

## 2016-01-23 ENCOUNTER — Emergency Department (HOSPITAL_COMMUNITY): Payer: Medicare Other

## 2016-01-23 ENCOUNTER — Encounter (HOSPITAL_COMMUNITY): Payer: Self-pay | Admitting: Emergency Medicine

## 2016-01-23 DIAGNOSIS — Y9289 Other specified places as the place of occurrence of the external cause: Secondary | ICD-10-CM | POA: Insufficient documentation

## 2016-01-23 DIAGNOSIS — Z8751 Personal history of pre-term labor: Secondary | ICD-10-CM | POA: Insufficient documentation

## 2016-01-23 DIAGNOSIS — Z8619 Personal history of other infectious and parasitic diseases: Secondary | ICD-10-CM | POA: Diagnosis not present

## 2016-01-23 DIAGNOSIS — Z87440 Personal history of urinary (tract) infections: Secondary | ICD-10-CM | POA: Insufficient documentation

## 2016-01-23 DIAGNOSIS — Z87891 Personal history of nicotine dependence: Secondary | ICD-10-CM | POA: Diagnosis not present

## 2016-01-23 DIAGNOSIS — W228XXA Striking against or struck by other objects, initial encounter: Secondary | ICD-10-CM | POA: Diagnosis not present

## 2016-01-23 DIAGNOSIS — Z79899 Other long term (current) drug therapy: Secondary | ICD-10-CM | POA: Diagnosis not present

## 2016-01-23 DIAGNOSIS — Y9389 Activity, other specified: Secondary | ICD-10-CM | POA: Diagnosis not present

## 2016-01-23 DIAGNOSIS — J45909 Unspecified asthma, uncomplicated: Secondary | ICD-10-CM | POA: Diagnosis not present

## 2016-01-23 DIAGNOSIS — D649 Anemia, unspecified: Secondary | ICD-10-CM | POA: Diagnosis not present

## 2016-01-23 DIAGNOSIS — S99921A Unspecified injury of right foot, initial encounter: Secondary | ICD-10-CM | POA: Diagnosis not present

## 2016-01-23 DIAGNOSIS — F319 Bipolar disorder, unspecified: Secondary | ICD-10-CM | POA: Insufficient documentation

## 2016-01-23 DIAGNOSIS — Y998 Other external cause status: Secondary | ICD-10-CM | POA: Diagnosis not present
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
## 2016-01-23 NOTE — ED Provider Notes (Signed)
CSN: [id number]     Arrival date & time 01/23/16  0935 History  By signing my name below, I, Lyndel SafeKaitlyn Shelton, attest that this documentation has been prepared under the direction and in the presence of Delta Air LinesJeffery Armstead Heiland, PA-C.  Electronically Signed: Lyndel SafeKaitlyn Shelton, ED Scribe. 01/23/2016. 11:41 AM.   Chief Complaint  Patient presents with  . Foot Pain   The history is provided by the patient. No language interpreter was used.   HPI Comments: Tammy Cole is a 30 y.o. female who presents to the Emergency Department complaining of sudden onset, constant, moderate right, 5th toe pain onset last night after hitting the toe on a wooden wall. Her pain is worse with palpation of right 5th toe, however she is ambulatory without difficulty. She associates mild swelling to lateral side of foot and right little toe. Pt denies any other injuries sustained from incident.    Past Medical History  Diagnosis Date  . Asthma   . Anemia   . Abnormal Pap smear   . Headache(784.0)   . Onset of menses     age of 89, regular, lasting 3 to 4 days, medium to heavy flow  . Gonorrhea   . Chlamydia   . Herpes     Last outbreak August 2014  . Anxiety   . Bipolar 1 disorder (HCC)   . Pregnancy induced hypertension   . Preterm labor   . Infection     UTI  . Depression     doing well  . Vaginal Pap smear, abnormal     colpo, ok since  . Pre-eclampsia    Past Surgical History  Procedure Laterality Date  . Colposcopy    . Vaginal delivery      X3  . Wisdom tooth extraction    . Cesarean section N/A 03/02/2015    Procedure: CESAREAN SECTION;  Surgeon: Reva Boresanya S Pratt, MD;  Location: [facility];  Service: Obstetrics;  Laterality: N/A;   Family History  Problem Relation Age of Onset  . Arthritis    . Asthma    . Glaucoma    . Heart disease    . Hypertension    . Prostate cancer    . Cataracts    . COPD Father   . Prostate cancer Father   . Glaucoma Father   . Cataracts Father   . Hypertension Father    . Hypertension Maternal Grandmother   . Diabetes Maternal Grandmother    Social History  Substance Use Topics  . Smoking status: Former Smoker    Types: Cigarettes    Quit date: 12/20/2012  . Smokeless tobacco: Former NeurosurgeonUser     Comment: vapor-quit  . Alcohol Use: No   OB History    Gravida Para Term Preterm AB TAB SAB Ectopic Multiple Living   7 5 3 2 2  2   0 5     Review of Systems  Musculoskeletal: Positive for arthralgias. Negative for gait problem.  Skin: Negative for color change and wound.  All other systems reviewed and are negative.  Allergies  Review of patient's allergies indicates no known allergies.  Home Medications   Prior to Admission medications   Medication Sig Start Date End Date Taking? Authorizing Provider  acetaminophen (TYLENOL) 500 MG tablet Take 1,000 mg by mouth every 6 (six) hours as needed for mild pain.    Historical Provider, MD  ibuprofen (ADVIL,MOTRIN) 600 MG tablet Take 1 tablet (600 mg total) by mouth every 6 (six) hours.  03/05/15   Levie Heritage, DO  NIFEdipine (PROCARDIA) 20 MG capsule Take 1 capsule (20 mg total) by mouth 2 (two) times daily at 10 am and 4 pm. 01/26/15   Montez Morita, CNM  oxyCODONE-acetaminophen (PERCOCET/ROXICET) 5-325 MG per tablet Take 1-2 tablets by mouth every 4 (four) hours as needed. 03/05/15   Levie Heritage, DO  Prenatal Vit-Fe Fumarate-FA (PRENATAL COMPLETE) 14-0.4 MG TABS Take 1 tablet by mouth daily. 10/09/14   Mellody Drown, PA-C  Prenatal Vit-Min-FA-Fish Oil ([facility] PRENATAL GUMMY PO) Take 2 each by mouth daily.    Historical Provider, MD   BP 117/77 mmHg  Pulse 53  Temp(Src) 98.2 F (36.8 C) (Oral)  Resp 18  SpO2 99%  LMP 01/05/2016 Physical Exam  Constitutional: She is oriented to person, place, and time. She appears well-developed and well-nourished. No distress.  HENT:  Head: Normocephalic.  Eyes: Conjunctivae are normal.  Neck: Normal range of motion. Neck supple.  Cardiovascular: Normal rate.    Pulmonary/Chest: Effort normal. No respiratory distress.  Musculoskeletal: Normal range of motion. She exhibits no edema or tenderness.  No edema or tenderness noted to right little toe. No deformity of toe.   Neurological: She is alert and oriented to person, place, and time. Coordination normal.  Skin: Skin is warm.  Psychiatric: She has a normal mood and affect. Her behavior is normal.  Nursing note and vitals reviewed.   ED Course  Procedures  DIAGNOSTIC STUDIES: Oxygen Saturation is 99% on RA, normal by my interpretation.    COORDINATION OF CARE: 11:21 AM Discussed treatment plan with pt at bedside and pt agreed to plan.   Imaging Review Dg Toe 5th Right  01/23/2016  CLINICAL DATA:  Blunt trauma to the fifth toe last night with persistent pain, initial encounter EXAM: RIGHT FIFTH TOE COMPARISON:  None. FINDINGS: There is no evidence of fracture or dislocation. There is no evidence of arthropathy or other focal bone abnormality. Soft tissues are unremarkable. IMPRESSION: No acute abnormality noted. Electronically Signed   By: Alcide Clever M.D.   On: 01/23/2016 10:25   I have personally reviewed and evaluated these images results as part of my medical decision-making.   MDM   Final diagnoses:  Toe injury, right, initial encounter   Labs:  Imaging: Dg right 5th toe negative   Consults:  Therapeutics:  Discharge Meds:   Assessment/Plan:   Patient X-Ray negative for obvious fracture or dislocation.  Pt advised to apply ice to right, 5th toe for 20 minutes at a time, 4 times per day. Conservative therapy recommended and discussed. Patient will be discharged home & is agreeable with above plan. Returns precautions discussed. Pt appears safe for discharge.   I personally performed the services described in this documentation, which was scribed in my presence. The recorded information has been reviewed and is accurate.    Eyvonne Mechanic, PA-C 01/23/16 1248  Tilden Fossa, MD 01/24/16 1101

## 2016-01-23 NOTE — Discharge Instructions (Signed)

## 2016-01-23 NOTE — ED Notes (Signed)
Pt currently in xray

## 2016-01-23 NOTE — ED Notes (Signed)
Patient walked out without discharge paperwork.   Patient stated she was going outside to let her ride know she would be out in a few minutes.  Patient did not return.

## 2016-03-12 IMAGING — US US OB TRANSVAGINAL
1 series · 14 of 28 positions shown · non-contrast
Comparison: 07/29/2014

CLINICAL DATA: Vaginal spotting.

EXAM:
TRANSVAGINAL OB ULTRASOUND
TECHNIQUE: Transvaginal ultrasound was performed for complete evaluation of the
gestation as well as the maternal uterus, adnexal regions, and
pelvic cul-de-sac.

[Series 1: us ob transvaginal · 14 of 34 slices shown]
[im 2/34]
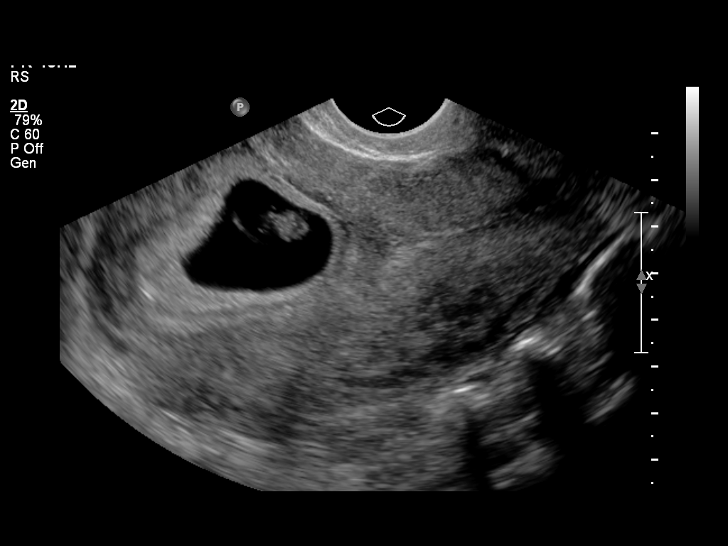
[im 4/34]
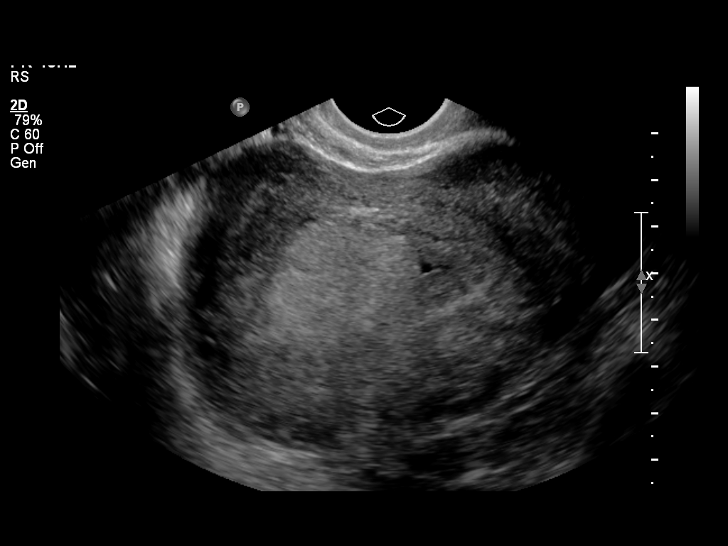
[im 7/34]
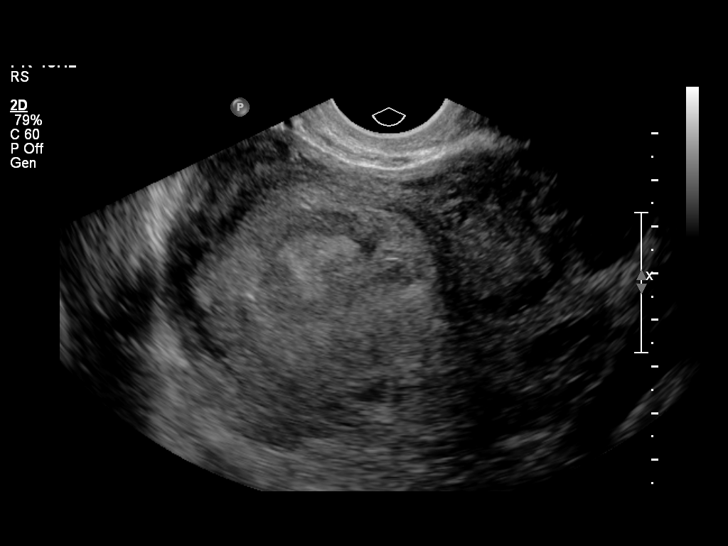
[im 9/34]
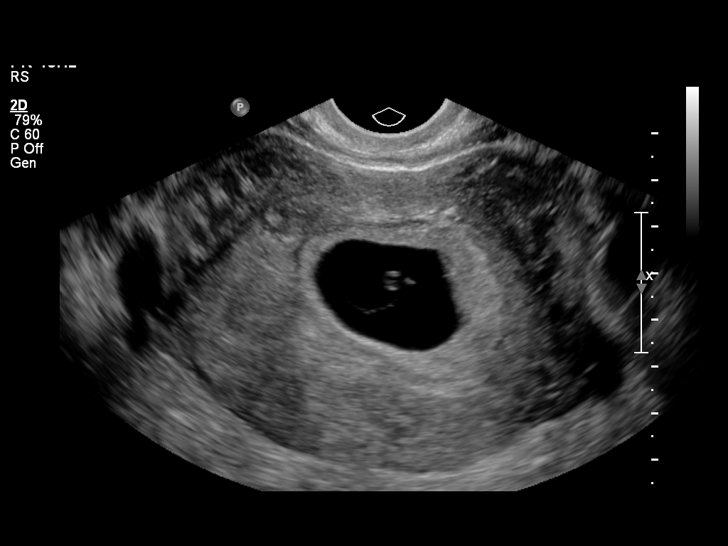
[im 12/34]
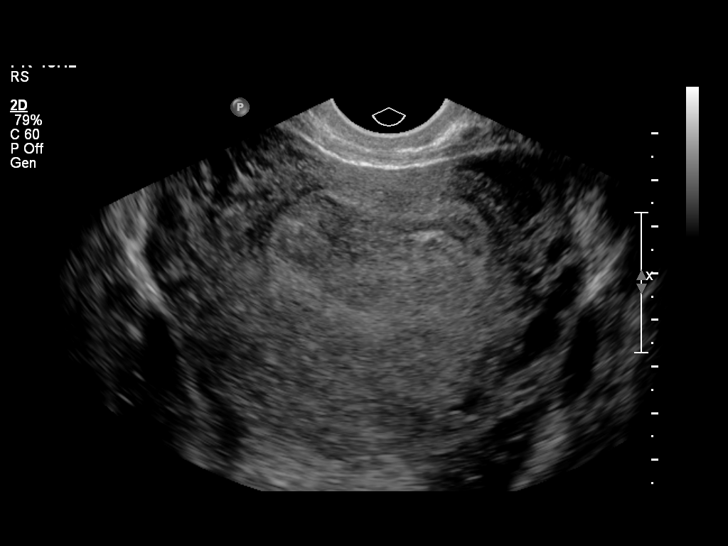
[im 14/34]
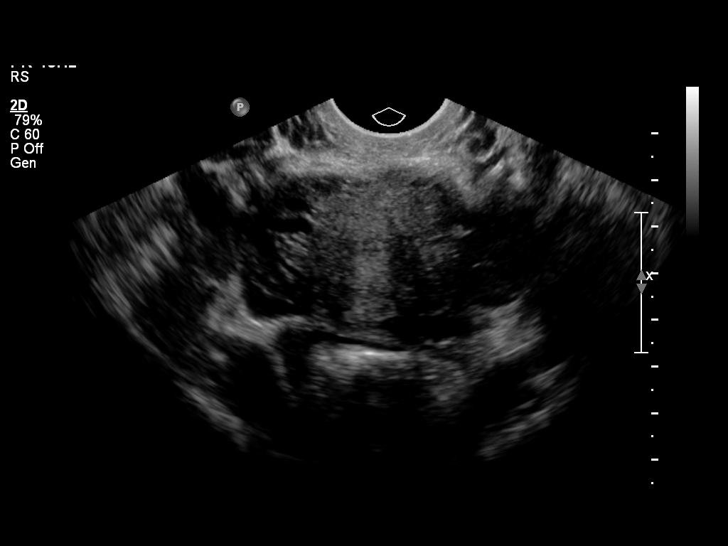
[im 16/34]
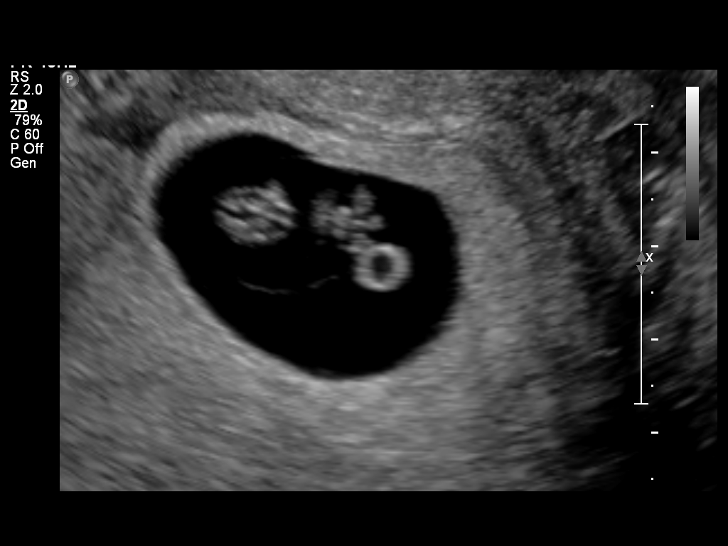
[im 19/34]
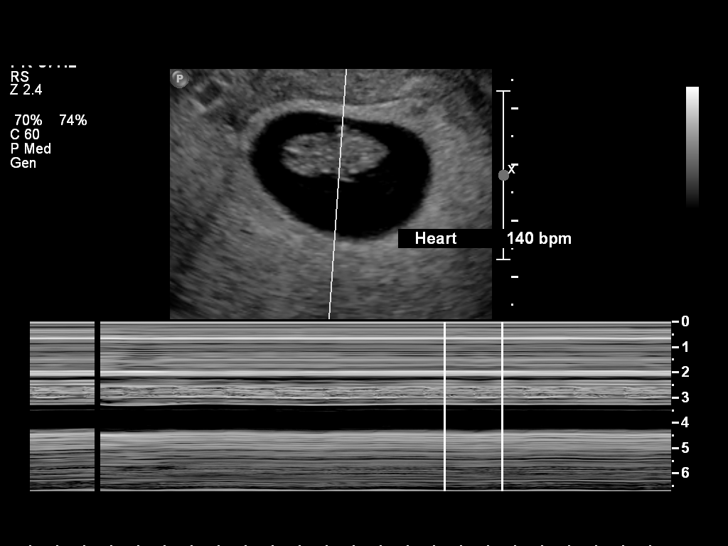
[im 21/34]
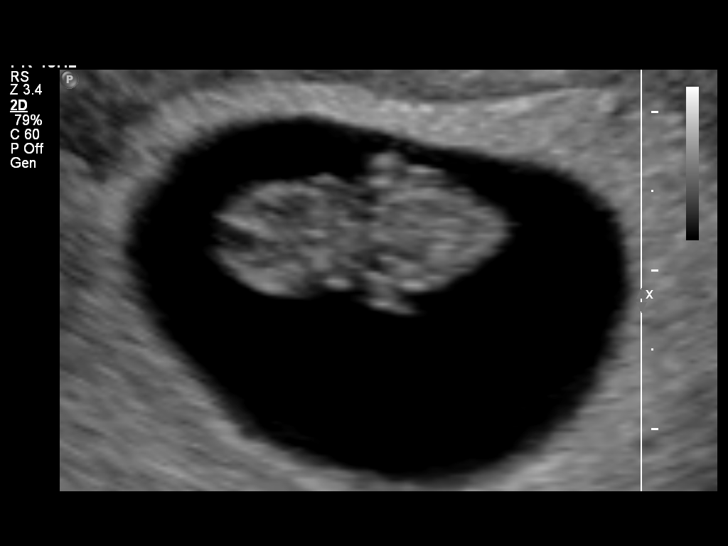
[im 24/34]
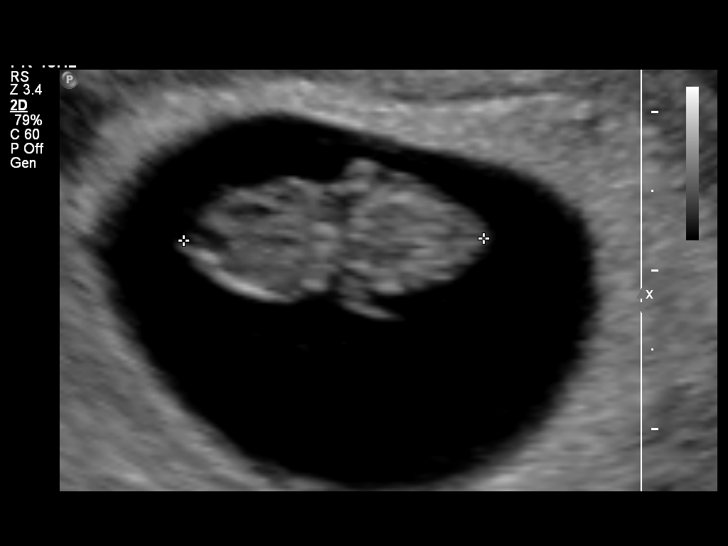
[im 26/34]
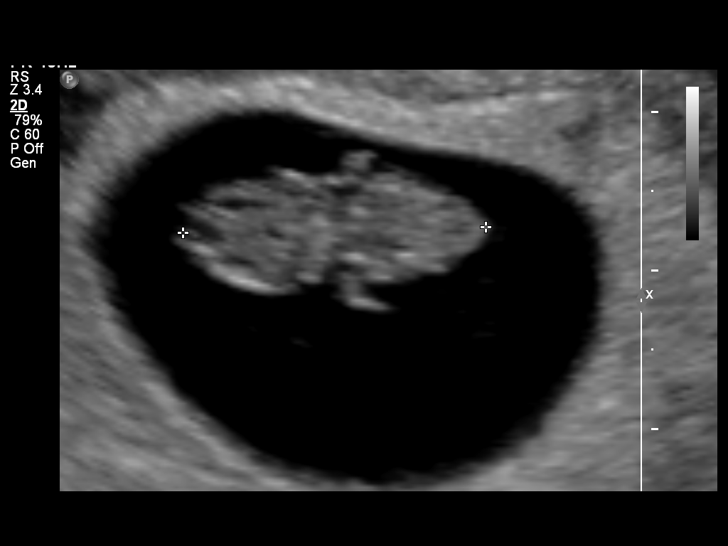
[im 29/34]
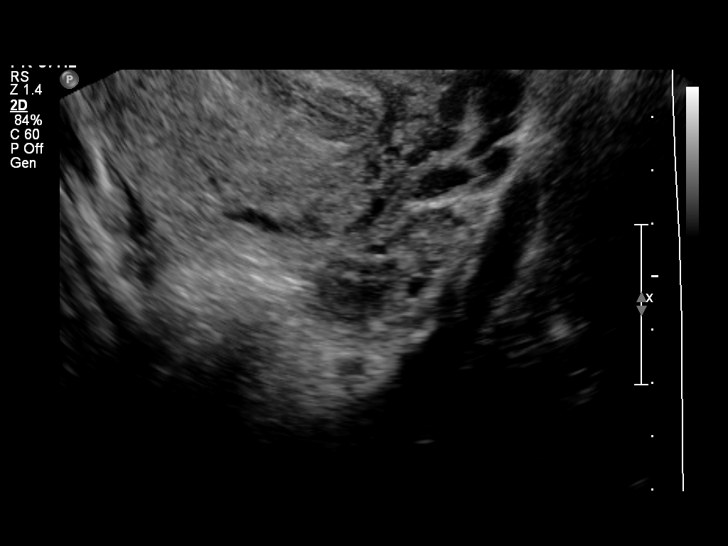
[im 31/34]
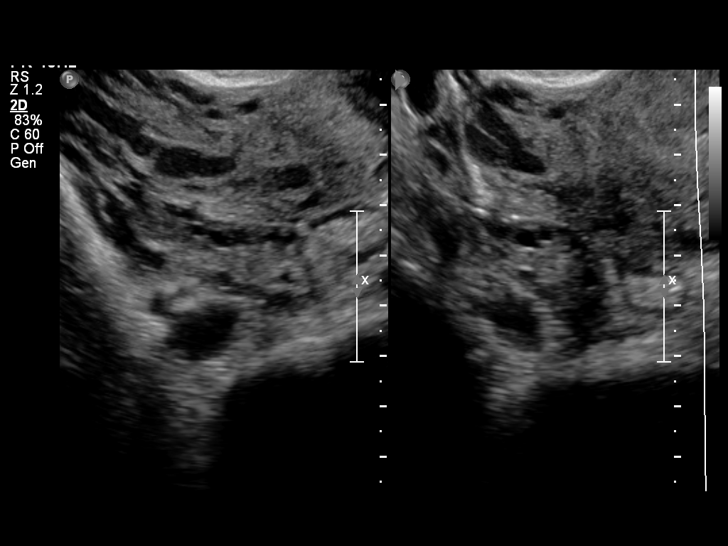
[im 34/34]
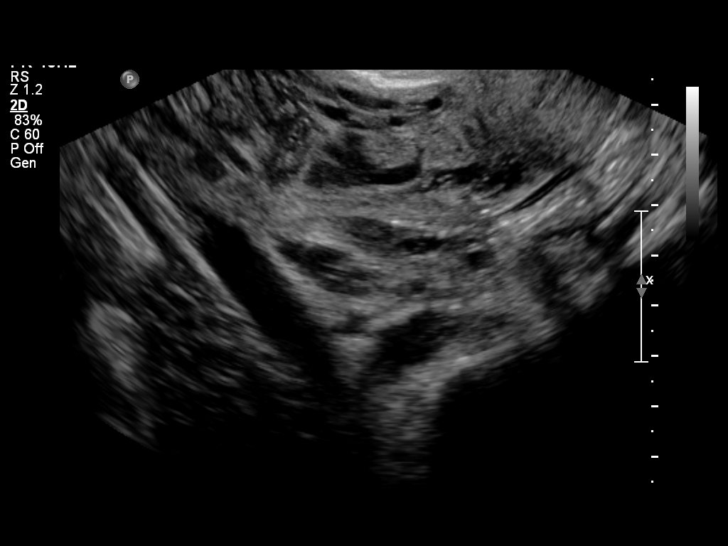

[14 of 28 positions shown; findings below may reference images not displayed]

FINDINGS: Intrauterine gestational sac: Visualized/normal in shape.

Yolk sac:  Present

Embryo:  Present

Cardiac Activity: Present

Heart Rate: 140 bpm

CRL:   19  mm   8 w 3 d                  US EDC: 03/27/2015

Maternal uterus/adnexae: Normal appearance of the left ovary
measuring 4.4 x 2.2 x 3.0 cm. Normal appearance of the right ovary
measuring 3.6 x 1.6 x 1.8 cm. No free fluid. No subchorionic
hemorrhage.
IMPRESSION: Single live intrauterine pregnancy. Calculated gestational age is 8
weeks and 3 days.

## 2016-08-08 IMAGING — US US OB FOLLOW-UP
1 series · 12 of 28 positions shown · non-contrast
Comparison: none

[Series 1: us ob follow-up · 0.23mm/px · 12 of 46 slices shown]
[im 2/46]
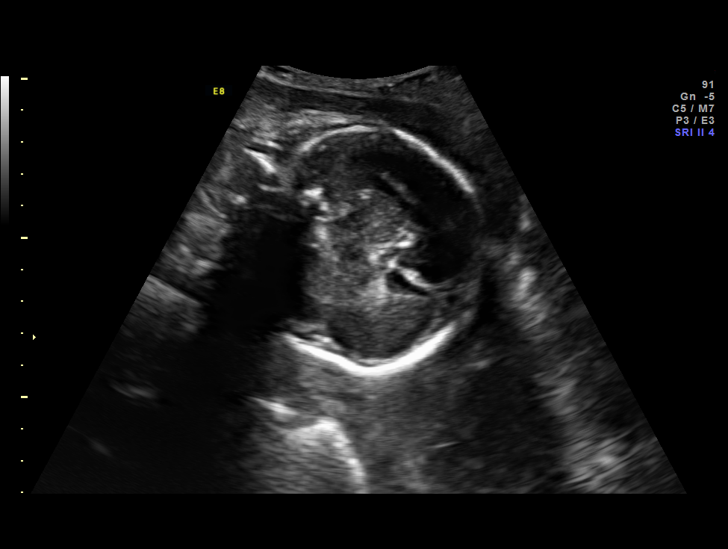
[im 6/46]
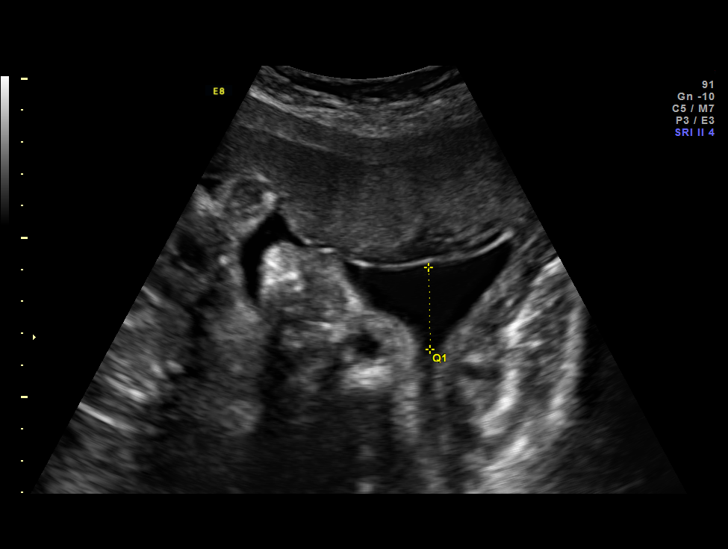
[im 9/46]
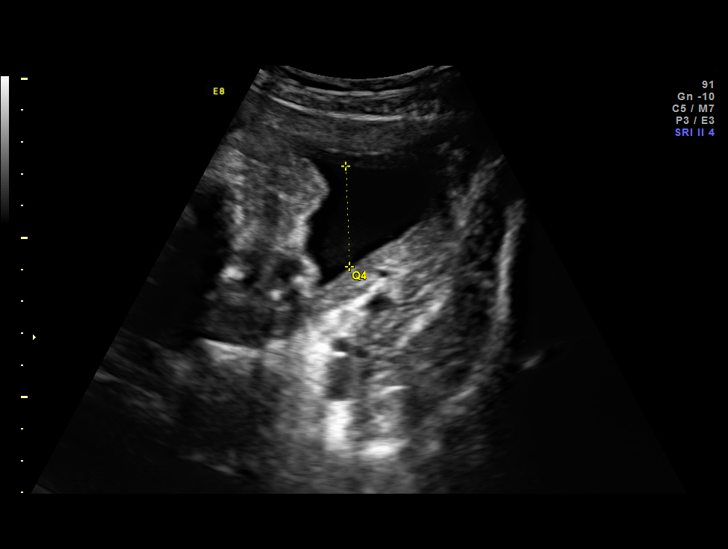
[im 14/46]
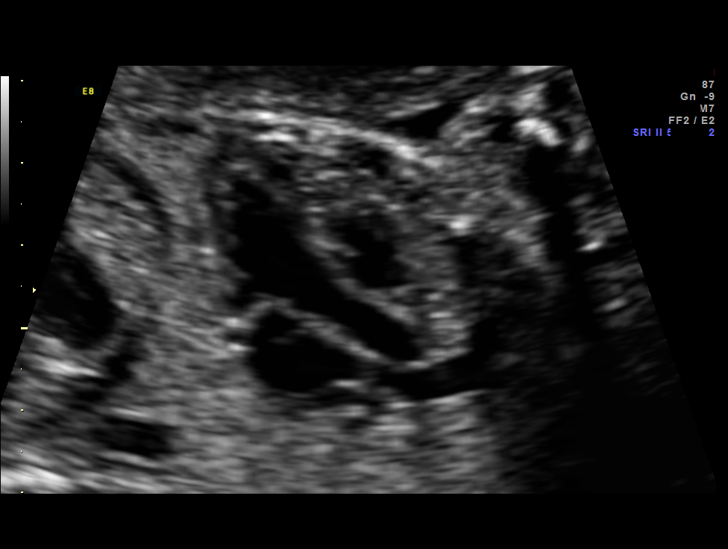
[im 17/46]
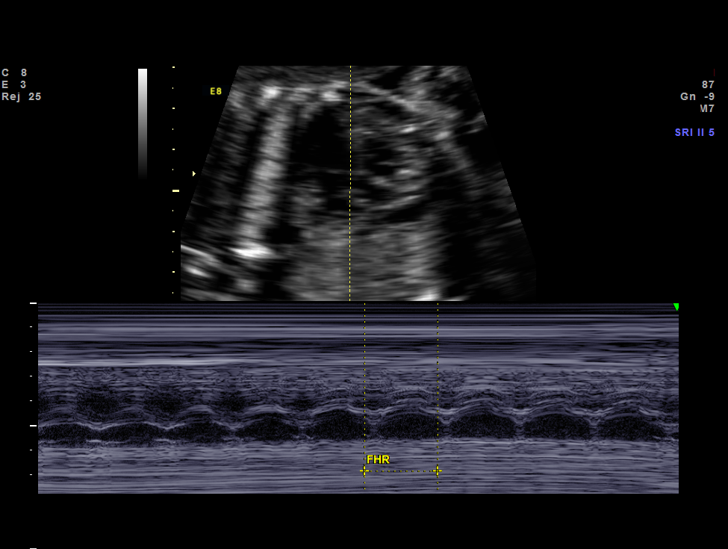
[im 21/46]
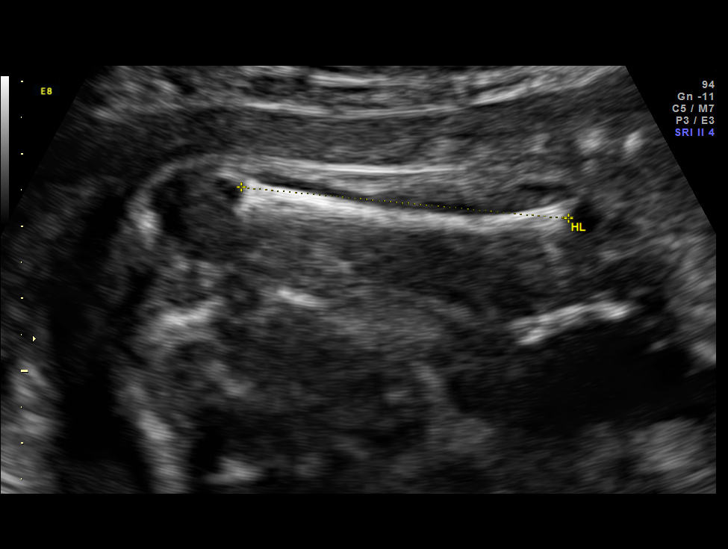
[im 26/46]
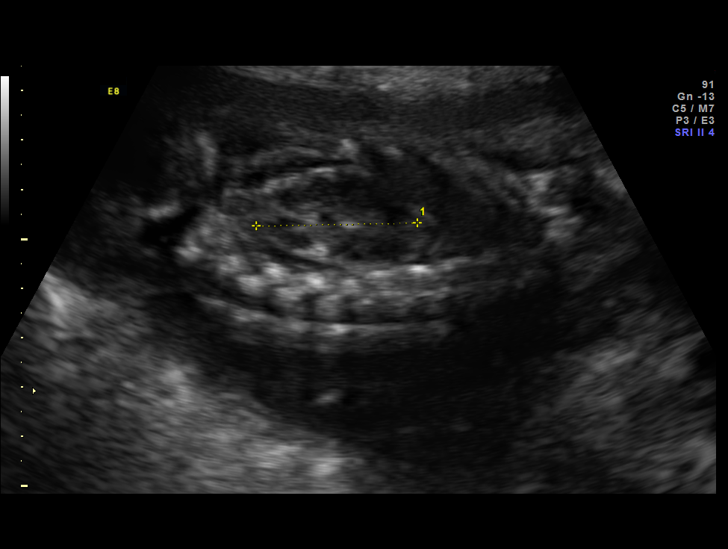
[im 29/46]
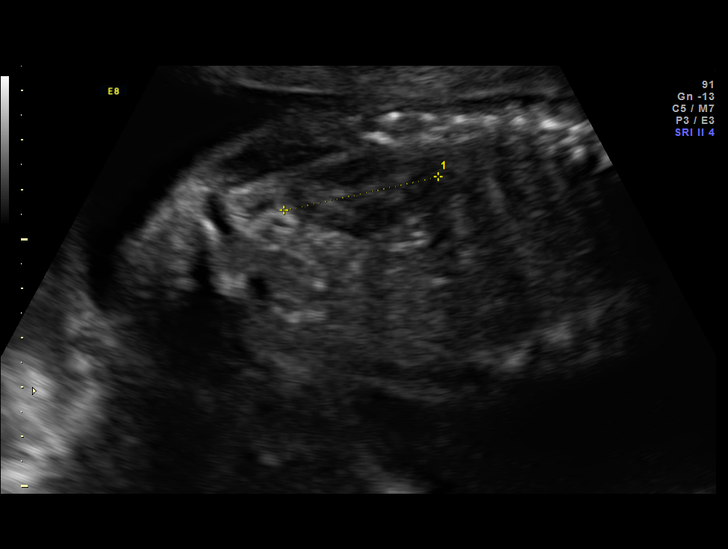
[im 32/46]
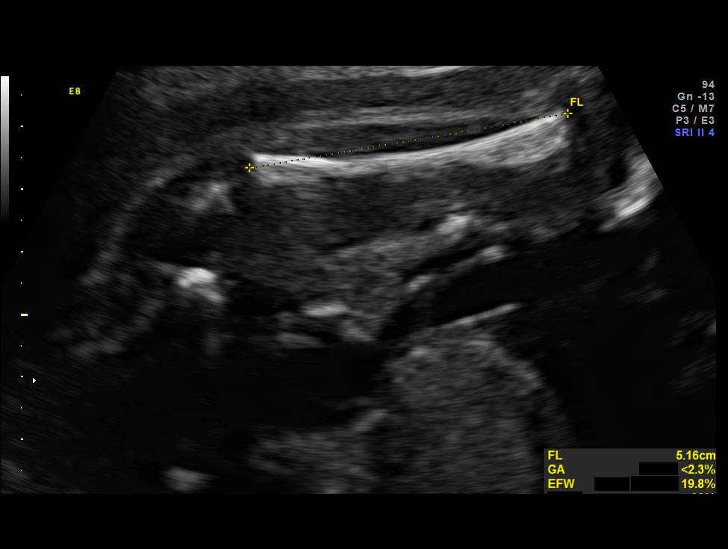
[im 37/46]
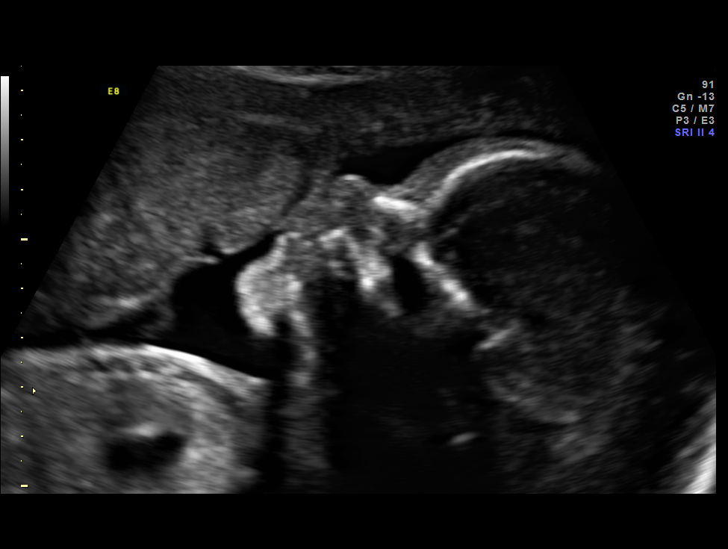
[im 41/46]
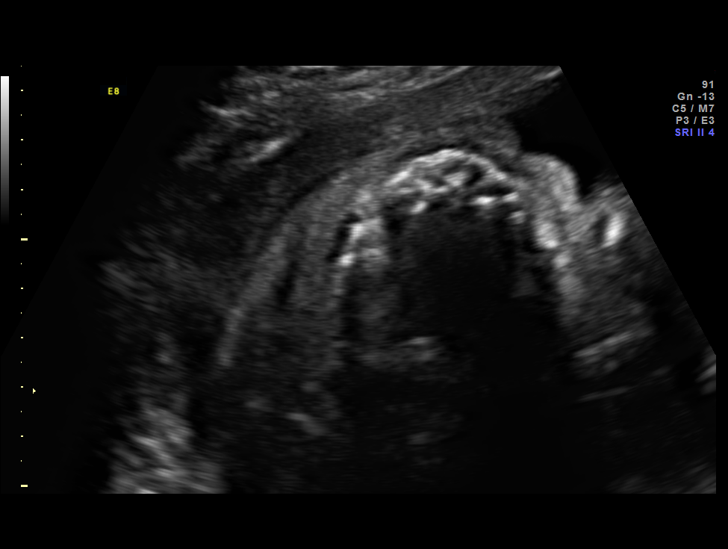
[im 44/46]
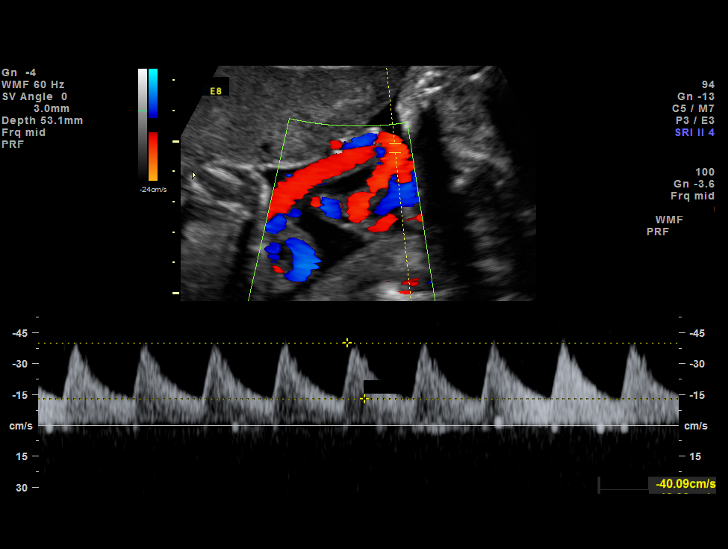

[12 of 28 positions shown; findings below may reference images not displayed]

OBSTETRICS REPORT
                      (Signed Final 01/14/2015 [DATE])

                                                         Faculty Physician
Service(s) Provided

 US OB FOLLOW UP                                       76816.1
 US UA CORD DOPPLER                                    76820.0
Indications

 Abnormal biochemical screen (quad) for Trisomy
 21 - [DATE] DSR; low risk NIPS
 Elevated Inhibin (3.65 MoM)
 Poor obstetric history: Previous preterm delivery
 (32 & 36 weeks)
 Poor obstetric history: Previous preeclampsia
 (postpartum)
 29 weeks gestation of pregnancy
Fetal Evaluation

 Num Of Fetuses:    1
 Fetal Heart Rate:  138                          bpm
 Cardiac Activity:  Observed
 Presentation:      Cephalic
 Placenta:          Anterior, above cervical os
 P. Cord            Previously Visualized
 Insertion:

 Amniotic Fluid
 AFI FV:      Subjectively within normal limits
 AFI Sum:     12.14   cm       30  %Tile     Larg Pckt:    3.42  cm
 RUQ:   2.57    cm   RLQ:    3.16   cm    LUQ:   2.99    cm   LLQ:    3.42   cm
Biometry

 BPD:     73.2  mm     G. Age:  29w 3d                CI:         77.4   70 - 86
 OFD:     94.6  mm                                    FL/HC:      18.9   19.2 -

 HC:     268.1  mm     G. Age:  29w 1d        9  %    HC/AC:      1.16   0.99 -

 AC:     232.1  mm     G. Age:  27w 4d        3  %    FL/BPD:     69.4   71 - 87
 FL:      50.8  mm     G. Age:  27w 2d      < 3  %    FL/AC:      21.9   20 - 24
 HUM:     45.5  mm     G. Age:  26w 6d      < 5  %
 Est. FW:    6669  gm      2 lb 7 oz     18  %
Gestational Age
 LMP:           30w 2d        Date:  06/16/14                 EDD:   03/23/15
 U/S Today:     28w 3d                                        EDD:   04/05/15
 Best:          29w 5d     Det. By:  Early Ultrasound         EDD:   03/27/15
                                     (08/18/14)
Anatomy

 Cranium:          Appears normal         Aortic Arch:      Previously seen
 Fetal Cavum:      Appears normal         Ductal Arch:      Previously seen
 Ventricles:       Appears normal         Diaphragm:        Appears normal
 Choroid Plexus:   Previously seen        Stomach:          Appears normal, left
                                                            sided
 Cerebellum:       Previously seen        Abdomen:          Appears normal
 Posterior Fossa:  Previously seen        Abdominal Wall:   Appears nml (cord
                                                            insert, abd wall)
 Nuchal Fold:      Previously seen        Cord Vessels:     Appears normal (3
                                                            vessel cord)
 Face:             Appears normal         Kidneys:          Appear normal
                   (orbits and profile)
 Lips:             Appears normal         Bladder:          Appears normal
 Palate:           Appears normal         Spine:            Previously seen
 Heart:            Appears normal         Lower             Previously seen
                   (4CH, axis, and        Extremities:
                   situs)
 RVOT:             Appears normal         Upper             Previously seen
                                          Extremities:
 LVOT:             Appears normal

 Other:  Male gender. Heels and 5th digit previously visualized. Nasal bone
         previously visualized.
Doppler - Fetal Vessels

 Umbilical Artery
 S/D:   3.03           57  %tile       RI:
                                       PSV:       45.51   cm/s

Cervix Uterus Adnexa

 Cervix:       Not visualized (advanced GA >56wks)
Impression

 SIUP at 29+5 weeks
 Normal interval anatomy; anatomic survey complete
 Normal amniotic fluid volume
 Appropriate interval growth with EFW at the 18th %tile; AC at
 the 3rd %tile
 UA dopplers were normal for this GA (57th %tile)
Recommendations

 Follow-up ultrasound for growth in 3 weeks

 questions or concerns.

## 2016-09-05 IMAGING — US US UA DOPPLER RE-EVAL
1 series · 13 of 28 positions shown · non-contrast
Comparison: none

[Series 1: us ua doppler re-eval · 0.26mm/px · 13 of 50 slices shown]
[im 2/50]
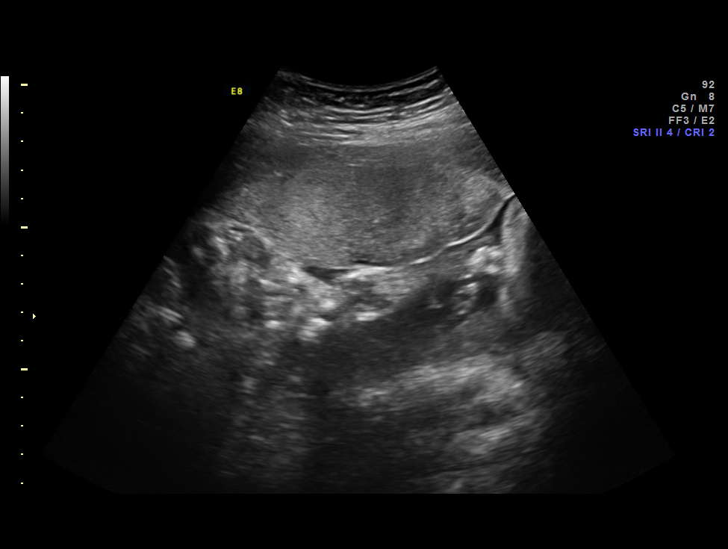
[im 6/50]
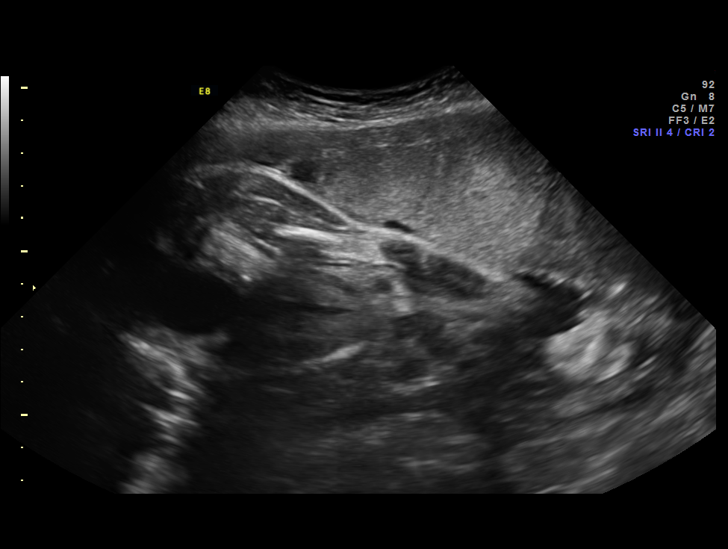
[im 10/50]
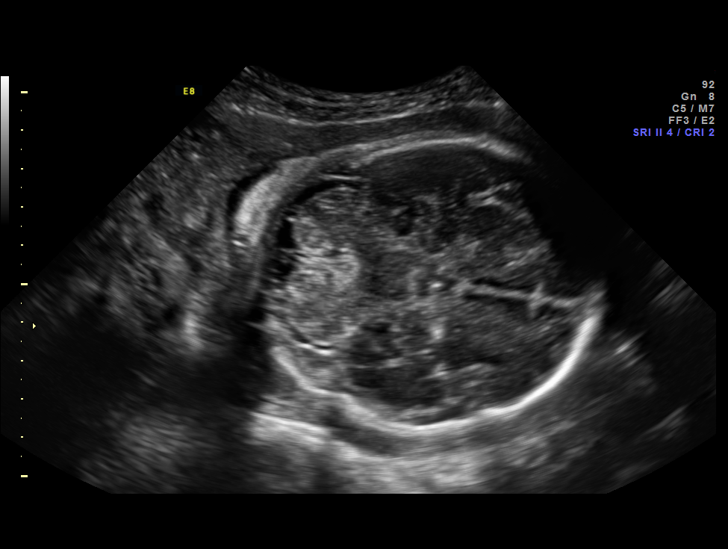
[im 13/50]
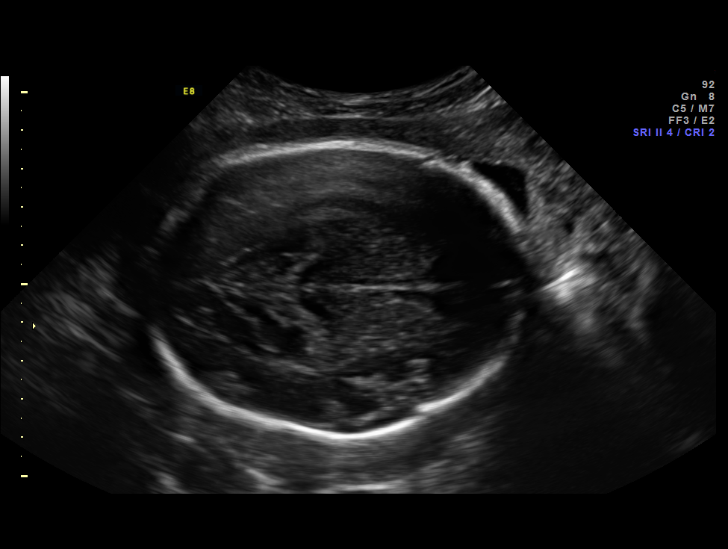
[im 17/50]
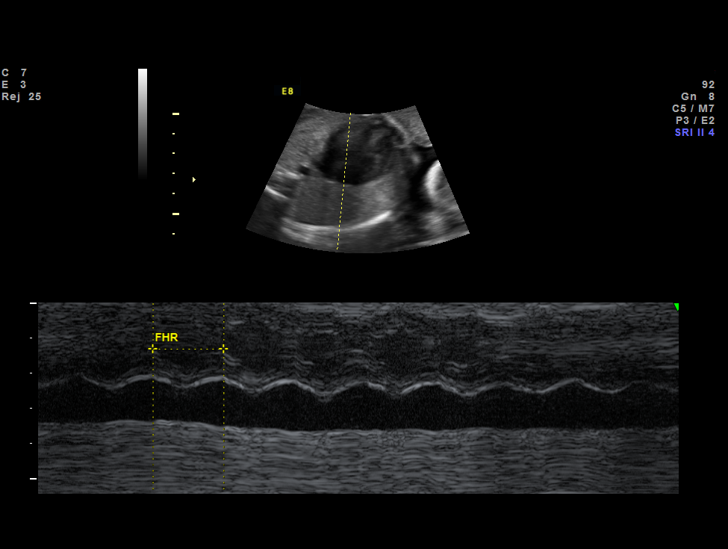
[im 20/50]
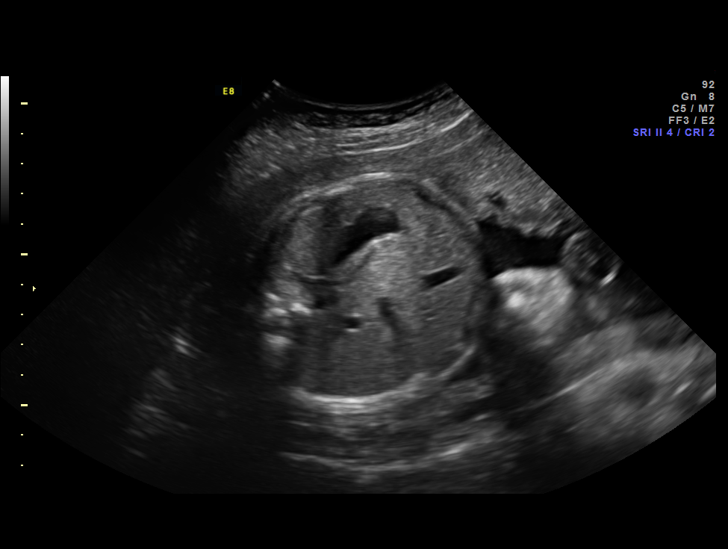
[im 26/50]
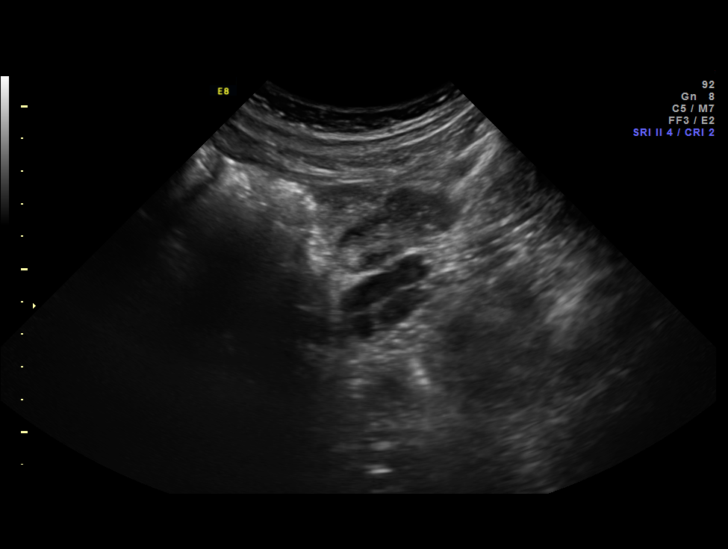
[im 30/50]
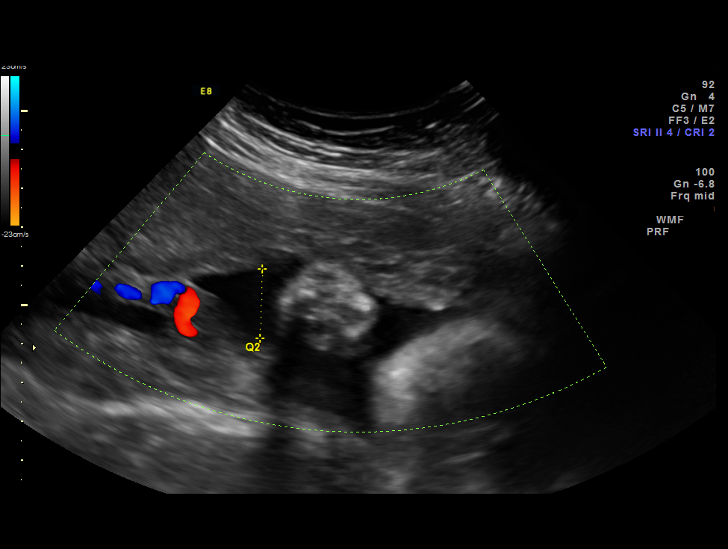
[im 33/50]
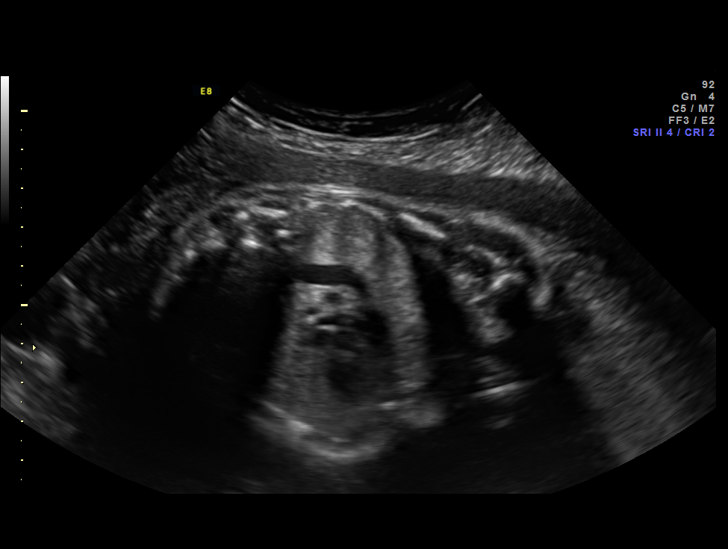
[im 37/50]
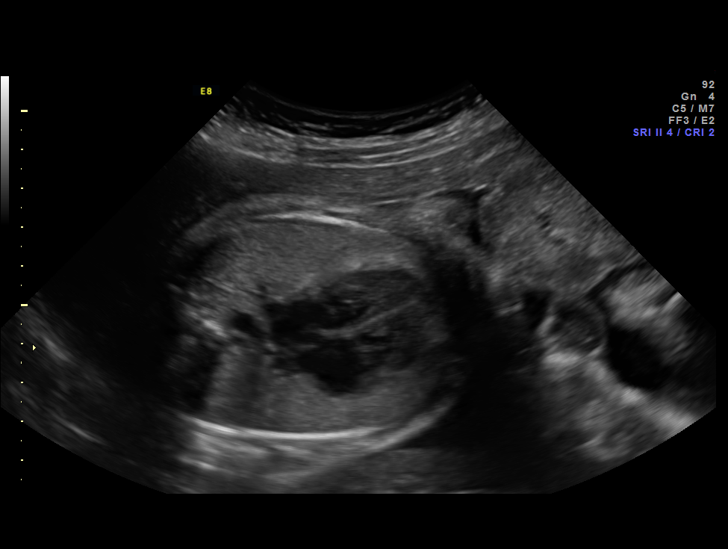
[im 40/50]
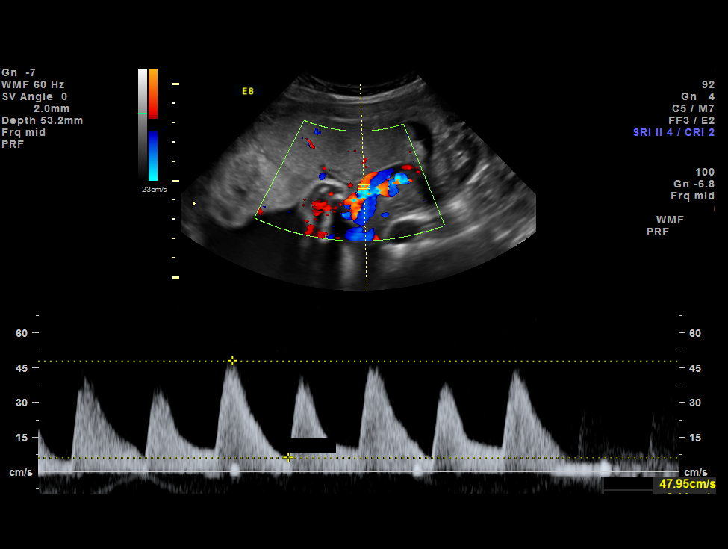
[im 44/50]
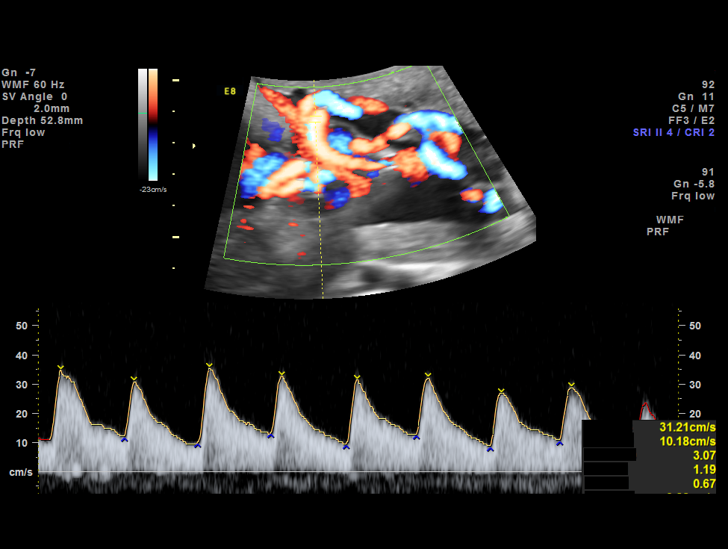
[im 48/50]
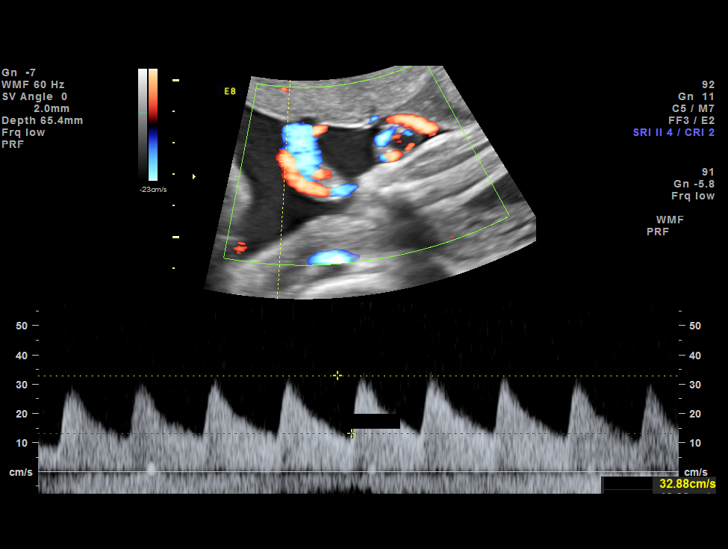

[13 of 28 positions shown; findings below may reference images not displayed]

OBSTETRICS REPORT
                      (Signed Final 02/11/2015 [DATE])

                                                         Faculty Physician
Service(s) Provided

 [HOSPITAL]                                         76815.0
 US UA DOPPLER RE-EVAL                                 76828.1
Indications

 33 weeks gestation of pregnancy
 Elevated Inhibin (3.65 MoM)
 Poor obstetric history: Previous preterm delivery
 (32 & 36 weeks)
 Poor obstetric history: Previous preeclampsia
 (postpartum)
 Abnormal biochemical screen (quad) for Trisomy
 21 - [DATE] DSR; low risk NIPS
 Maternal care for known of suspected poor fetal
 growth, third trimester, not applicable or unspecified
Fetal Evaluation

 Num Of Fetuses:    1
 Fetal Heart Rate:  142                          bpm
 Cardiac Activity:  Observed
 Presentation:      Cephalic
 Placenta:          Anterior, above cervical os
 P. Cord            Previously Visualized
 Insertion:

 Amniotic Fluid
 AFI FV:      Subjectively within normal limits
 AFI Sum:     10.67   cm       24  %Tile     Larg Pckt:    4.82  cm
 RUQ:   1.55    cm   RLQ:    2.51   cm    LUQ:   1.79    cm   LLQ:    4.82   cm
Gestational Age

 LMP:           34w 2d        Date:  06/16/14                 EDD:   03/23/15
 Best:          33w 5d     Det. By:  Early Ultrasound         EDD:   03/27/15
                                     (08/18/14)
Doppler - Fetal Vessels

 Umbilical Artery
 S/D:   3.41           90  %tile       RI:
                                       PSV:       47.95   cm/s
 Umbilical Artery
 Absent DFV:    No     Reverse DFV:    No

Cervix Uterus Adnexa

 Cervix:       Not visualized (advanced GA >90wks)
 Uterus:       No abnormality visualized.
 Cul De Sac:   No free fluid seen.

 Left Ovary:    No adnexal mass visualized.
 Right Ovary:   No adnexal mass visualized.
 Adnexa:     No abnormality visualized.
Impression

 SIUP at 33+5 weeks
 FGR/SGA
 Normal amniotic fluid volume
 NST reactive
 UA dopplers were in the high normal range for this GA
Recommendations

 Continue twice weekly NSTs with weekly AFIs and UA
 dopplers
 Growth US in 2 weeks

 questions or concerns.

## 2017-01-09 ENCOUNTER — Emergency Department (HOSPITAL_COMMUNITY)
Admission: EM | Admit: 2017-01-09 | Discharge: 2017-01-09 | Disposition: A | Discharge status: Home / Self Care | Payer: Medicare Other | Attending: Emergency Medicine | Admitting: Emergency Medicine

## 2017-01-09 ENCOUNTER — Encounter (HOSPITAL_COMMUNITY): Payer: Self-pay

## 2017-01-09 ENCOUNTER — Emergency Department (HOSPITAL_COMMUNITY): Payer: Medicare Other

## 2017-01-09 DIAGNOSIS — S6991XA Unspecified injury of right wrist, hand and finger(s), initial encounter: Secondary | ICD-10-CM | POA: Diagnosis not present

## 2017-01-09 DIAGNOSIS — J45909 Unspecified asthma, uncomplicated: Secondary | ICD-10-CM | POA: Diagnosis not present

## 2017-01-09 DIAGNOSIS — Y92012 Bathroom of single-family (private) house as the place of occurrence of the external cause: Secondary | ICD-10-CM | POA: Insufficient documentation

## 2017-01-09 DIAGNOSIS — Z87891 Personal history of nicotine dependence: Secondary | ICD-10-CM | POA: Insufficient documentation

## 2017-01-09 DIAGNOSIS — Y999 Unspecified external cause status: Secondary | ICD-10-CM | POA: Insufficient documentation

## 2017-01-09 DIAGNOSIS — Y9389 Activity, other specified: Secondary | ICD-10-CM | POA: Insufficient documentation

## 2017-01-09 DIAGNOSIS — W228XXA Striking against or struck by other objects, initial encounter: Secondary | ICD-10-CM | POA: Diagnosis not present

## 2017-01-09 MED ORDER — OXYCODONE-ACETAMINOPHEN 5-325 MG PO TABS
1.0000 | ORAL_TABLET | Freq: Once | ORAL | Status: AC
  Administered 2017-01-09: 1 via ORAL
  Filled 2017-01-09: qty 1

## 2017-01-09 NOTE — Discharge Instructions (Signed)
Your x-rays did not show a fracture today.  Your pain is likely due to a sprain or tendon inflammation.  Please take ibuprofen 600 mg + tylenol 650 mg morning, afternoon and night for the first three days.  You  may take ibuprofen or tylenol as needed after that.   Wear your splint for a minimum of 10-14 days.  After 2 weeks you may do mild finger range of motion, if you have no pain you may stop wearing your splint.  If you still have pain, please place the splint back on and wear it for an additional week.  Repeat until your finger is no longer tender with movement.   Ice twice a day for the first 3 days.

## 2017-01-09 NOTE — Progress Notes (Signed)
Orthopedic Tech Progress Note Patient Details:  Tammy Cole Aug 04, 1986 [id number]  Ortho Devices Type of Ortho Device: Finger splint Ortho Device/Splint Location: rue Ortho Device/Splint Interventions: Application   Nikki DomCrawford, Sonal Dorwart 01/09/2017, 4:08 PM

## 2017-01-09 NOTE — ED Notes (Addendum)
Ortho tech at bedside for static finger splint.

## 2017-01-09 NOTE — ED Triage Notes (Signed)
Patient hre with right hand index finger pain and swelling after hiting same yesterday on bathroom sink

## 2017-01-09 NOTE — ED Notes (Signed)
Patient transported to X-ray 

## 2017-01-09 NOTE — ED Notes (Signed)
[place], [place]  Pt in radiology.

## 2017-01-09 NOTE — ED Provider Notes (Addendum)
[facility]-EMERGENCY DEPT Provider Note   CSN: [id number] Arrival date & time: 01/09/17  1323  By signing my name below, I, Alyssa GroveMartin Green, attest that this documentation has been prepared under the direction and in the presence of Sharen Hecklaudia Sameera Betton, PA-C. Electronically Signed: Alyssa GroveMartin Green, ED Scribe. 01/09/17. 3:40 PM.  History   Chief Complaint No chief complaint on file.  The history is provided by the patient. No language interpreter was used.   HPI Comments: Tammy Cole is a 31 y.o. female who presents to the Emergency Department complaining of sudden onset, constant, moderate, right index finger pain s/p injury yesterday. Pt struck the dorsal aspect of her right index finger on the bathroom sink while trying to kill a bug. Pain is exacerbated with movement and palpation. Pt reports associated swelling, sensation of stiffness/tightness making it difficulty bend her finger. She denies numbness.   Past Medical History:  Diagnosis Date  . Abnormal Pap smear   . Anemia   . Anxiety   . Asthma   . Bipolar 1 disorder (HCC)   . Chlamydia   . Depression    doing well  . Gonorrhea   . Headache(784.0)   . Herpes    Last outbreak August 2014  . Infection    UTI  . Onset of menses    age of 89, regular, lasting 3 to 4 days, medium to heavy flow  . Pre-eclampsia   . Pregnancy induced hypertension   . Preterm labor   . Vaginal Pap smear, abnormal    colpo, ok since    Patient Active Problem List   Diagnosis Date Noted  . Normal pregnancy in multigravida in third trimester 03/02/2015  . Fetal intolerance to labor, delivered, current hospitalization 03/02/2015  . Abnormal findings on antenatal screening   . Maternal care for poor fetal growth   . [redacted] weeks gestation of pregnancy   . Poor fetal growth affecting management of mother in third trimester   . Generalized pruritus 12/25/2014  . Previous preterm delivery in second trimester, antepartum   . Abnormal quad screen   . Late  prenatal care 10/29/2014  . Urinary tract infection, site not specified 02/26/2013  . Vitamin D deficiency 02/26/2013  . Vaginitis and vulvovaginitis, unspecified 02/26/2013  . H/O herpes genitalis 01/09/2013  . History of depressed bipolar disorder (HCC) 01/09/2013    Past Surgical History:  Procedure Laterality Date  . CESAREAN SECTION N/A 03/02/2015   Procedure: CESAREAN SECTION;  Surgeon: Reva Boresanya S Pratt, MD;  Location: [facility];  Service: Obstetrics;  Laterality: N/A;  . COLPOSCOPY    . VAGINAL DELIVERY     X3  . WISDOM TOOTH EXTRACTION      OB History    Gravida Para Term Preterm AB Living   7 5 3 2 2 5    SAB TAB Ectopic Multiple Live Births   2     0 5       Home Medications    Prior to Admission medications   Medication Sig Start Date End Date Taking? Authorizing Provider  acetaminophen (TYLENOL) 500 MG tablet Take 1,000 mg by mouth every 6 (six) hours as needed for mild pain.    Historical Provider, MD  ibuprofen (ADVIL,MOTRIN) 600 MG tablet Take 1 tablet (600 mg total) by mouth every 6 (six) hours. 03/05/15   Levie HeritageJacob J Stinson, DO  NIFEdipine (PROCARDIA) 20 MG capsule Take 1 capsule (20 mg total) by mouth 2 (two) times daily at 10 am and 4 pm.  01/26/15   Montez Morita, CNM  oxyCODONE-acetaminophen (PERCOCET/ROXICET) 5-325 MG per tablet Take 1-2 tablets by mouth every 4 (four) hours as needed. 03/05/15   Levie Heritage, DO  Prenatal Vit-Fe Fumarate-FA (PRENATAL COMPLETE) 14-0.4 MG TABS Take 1 tablet by mouth daily. 10/09/14   Mellody Drown, PA-C  Prenatal Vit-Min-FA-Fish Oil ([facility] PRENATAL GUMMY PO) Take 2 each by mouth daily.    Historical Provider, MD    Family History Family History  Problem Relation Age of Onset  . COPD Father   . Prostate cancer Father   . Glaucoma Father   . Cataracts Father   . Hypertension Father   . Hypertension Maternal Grandmother   . Diabetes Maternal Grandmother   . Arthritis    . Asthma    . Glaucoma    . Heart disease    .  Hypertension    . Prostate cancer    . Cataracts      Social History Social History  Substance Use Topics  . Smoking status: Former Smoker    Types: Cigarettes    Quit date: 12/20/2012  . Smokeless tobacco: Former Neurosurgeon     Comment: vapor-quit  . Alcohol use No     Allergies   Patient has no known allergies.   Review of Systems Review of Systems  Constitutional: Negative for chills and fever.  HENT: Negative for congestion and sore throat.   Eyes: Negative for visual disturbance.  Respiratory: Negative for cough, chest tightness and shortness of breath.   Cardiovascular: Negative for chest pain.  Gastrointestinal: Negative for abdominal pain, diarrhea, nausea and vomiting.  Genitourinary: Negative for decreased urine volume and difficulty urinating.  Musculoskeletal: Positive for arthralgias and joint swelling.  Skin: Negative for rash.  Neurological: Negative for dizziness, light-headedness, numbness and headaches.   Physical Exam Updated Vital Signs BP 119/62   Pulse 80   Temp 98 F (36.7 C)   Resp 18   SpO2 99%   Physical Exam  Constitutional: She is oriented to person, place, and time. She appears well-developed and well-nourished. No distress.  NAD.  HENT:  Head: Normocephalic and atraumatic.  Right Ear: External ear normal.  Left Ear: External ear normal.  Nose: Nose normal.  Mouth/Throat: Oropharynx is clear and moist. No oropharyngeal exudate.  Moist mucous membranes.  No nasal mucosa edema. No nasal discharge noted. Oropharynx and tonsils normal without edema, erythema, exudates or lesions.  Uvula midline. No trismus.   Eyes: Conjunctivae and EOM are normal. Pupils are equal, round, and reactive to light. No scleral icterus.  Neck: Normal range of motion. Neck supple. No JVD present.  Cardiovascular: Normal rate, regular rhythm and normal heart sounds.   No murmur heard. Good capillary refill < 2 in right index digit  Pulmonary/Chest: Effort normal  and breath sounds normal. She has no wheezes.  Abdominal: Soft. There is no tenderness.  Musculoskeletal: She exhibits edema and tenderness.  Mild edema and tenderness at the dorsal aspect of right index PIP. Slightly decreased index ROM at PIP secondary to pain, otherwise full ROM of index MCP and DIP.    Lymphadenopathy:    She has no cervical adenopathy.  Neurological: She is alert and oriented to person, place, and time.  RIGHT HAND 5/5 strength with finger abduction. Good pincer and hand grip bilaterally.  Sensation to light touch intact in median, ulnar and radial nerve distribution.  Skin: Skin is warm and dry. Capillary refill takes less than 2 seconds.  Psychiatric: She  has a normal mood and affect. Her behavior is normal. Judgment and thought content normal.  Nursing note and vitals reviewed.  ED Treatments / Results  DIAGNOSTIC STUDIES: Oxygen Saturation is 99% on RA, normal by my interpretation.    COORDINATION OF CARE: 3:35 PM Discussed treatment plan with pt at bedside which includes splint application and Percocet x2 in ED and pt agreed to plan. Pt advised to continue wearing splint for 10 days and to use Tylenol, Ibuprofen and Ice for pain management.   Labs (all labs ordered are listed, but only abnormal results are displayed) Labs Reviewed - No data to display  EKG  EKG Interpretation None       Radiology Dg Hand Complete Right  Result Date: 01/09/2017 CLINICAL DATA:  Hit hand against hard object EXAM: RIGHT HAND - COMPLETE 3+ VIEW COMPARISON:  None. FINDINGS: Frontal, oblique, and lateral views were obtained. There is no fracture or dislocation. Joint spaces appear normal. No erosive change. IMPRESSION: No fracture or dislocation.  No evident arthropathy. Electronically Signed   By: Bretta Bang III M.D.   On: 01/09/2017 15:16    Procedures Procedures (including critical care time)  SPLINT APPLICATION Date/Time: 7:28 PM Authorized by: Liberty Handy Consent: Verbal consent obtained. Risks and benefits: risks, benefits and alternatives were discussed Consent given by: patient Splint applied by: ortho tech Location details: right index PIP Splint type: static finger splint Supplies used: static finger splint Post-procedure: The splinted body part was neurovascularly unchanged following the procedure. Patient tolerance: Patient tolerated the procedure well with no immediate complications.     Medications Ordered in ED Medications  oxyCODONE-acetaminophen (PERCOCET/ROXICET) 5-325 MG per tablet 1 tablet (1 tablet Oral Given 01/09/17 1600)     Initial Impression / Assessment and Plan / ED Course  I have reviewed the triage vital signs and the nursing notes.  Pertinent labs & imaging results that were available during my care of the patient were reviewed by me and considered in my medical decision making (see chart for details).  Clinical Course as of Jan 09 1710  Sun Jan 09, 2017  1704 Hand x-ray ordered at triage showed no bony injury to right index PIP join.  DG Hand Complete Right [CG]    Clinical Course User Index [CG] Liberty Handy, PA-C   Right hand dominant female with dorsal PIP tenderness of right index finger and mild edema after hitting it on bathroom sink.  X-rays negative.  Patient is point tender over dorsal aspect of right index PIP with very slight decreased PIP ROM (flexion) secondary to pain.  Neurovascularly intact.  Although x-rays negative will splint finger.  Doubt mallet finger or tendon displacement. Patient advised to rest, ice and wear splint for 2-4 weeks or until non tender with ROM.  Ibuprofen/tyelnol for pain control. Patient agreeable with tx plan.  I personally performed the services described in this documentation, which was scribed in my presence. The recorded information has been reviewed and is accurate.  Final Clinical Impressions(s) / ED Diagnoses   Final diagnoses:  Injury of  right index finger, initial encounter    New Prescriptions Discharge Medication List as of 01/09/2017  4:09 PM       Liberty Handy, PA-C 01/09/17 1711    Margarita Grizzle, MD 01/09/17 2036    Liberty Handy, PA-C 01/20/17 1930    Margarita Grizzle, MD 01/21/17 0006

## 2017-01-25 ENCOUNTER — Ambulatory Visit: Payer: Medicare Other | Admitting: Obstetrics and Gynecology

## 2017-02-02 ENCOUNTER — Emergency Department (HOSPITAL_COMMUNITY)
Admission: EM | Admit: 2017-02-02 | Discharge: 2017-02-02 | Disposition: A | Discharge status: Home / Self Care | Payer: Medicare Other | Attending: Emergency Medicine | Admitting: Emergency Medicine

## 2017-02-02 ENCOUNTER — Encounter (HOSPITAL_COMMUNITY): Payer: Self-pay | Admitting: *Deleted

## 2017-02-02 ENCOUNTER — Emergency Department (HOSPITAL_COMMUNITY): Payer: Medicare Other

## 2017-02-02 DIAGNOSIS — Y999 Unspecified external cause status: Secondary | ICD-10-CM | POA: Diagnosis not present

## 2017-02-02 DIAGNOSIS — W228XXA Striking against or struck by other objects, initial encounter: Secondary | ICD-10-CM | POA: Insufficient documentation

## 2017-02-02 DIAGNOSIS — Y9389 Activity, other specified: Secondary | ICD-10-CM | POA: Diagnosis not present

## 2017-02-02 DIAGNOSIS — Z87891 Personal history of nicotine dependence: Secondary | ICD-10-CM | POA: Insufficient documentation

## 2017-02-02 DIAGNOSIS — S6991XA Unspecified injury of right wrist, hand and finger(s), initial encounter: Secondary | ICD-10-CM | POA: Diagnosis not present

## 2017-02-02 DIAGNOSIS — J45909 Unspecified asthma, uncomplicated: Secondary | ICD-10-CM | POA: Insufficient documentation

## 2017-02-02 DIAGNOSIS — Y9289 Other specified places as the place of occurrence of the external cause: Secondary | ICD-10-CM | POA: Diagnosis not present

## 2017-02-02 MED ORDER — HYDROCODONE-ACETAMINOPHEN 5-325 MG PO TABS: 1.0000 | ORAL_TABLET | ORAL | 0 refills | Status: DC | PRN

## 2017-02-02 MED ORDER — NAPROXEN 500 MG PO TABS: 500.0000 mg | ORAL_TABLET | Freq: Two times a day (BID) | ORAL | 0 refills | Status: AC

## 2017-02-02 NOTE — ED Provider Notes (Signed)
[facility] Provider Note    By signing my name below, I, Tammy Cole, attest that this documentation has been prepared under the direction and in the presence of [name], PA-C. Electronically Signed: Earmon Cole, ED Scribe. 02/02/17. 6:46 PM.    History   Chief Complaint Chief Complaint  Patient presents with  . Finger Injury   The history is provided by the patient and medical records. No language interpreter was used.    Tammy Cole is a 31 y.o. female who presents to the Emergency Department complaining of right index finger pain that began approximately one month ago. She reports associated soreness and intermittent swelling. She states she hit her finger on something in her bathroom initially and had negative X-Rays. Now she reports the pain is not getting any better. She has been icing the area and wearing a splint she was given at her last visit for pain relief. Moving the finger increases the pain. She denies alleviating factors. She denies numbness, tingling or weakness of the right index finger or right hand, bruising, wounds. She denies any new trauma, injury or fall. She denies any previous injury to the finger. She is right hand dominant.   Past Medical History:  Diagnosis Date  . Abnormal Pap smear   . Anemia   . Anxiety   . Asthma   . Bipolar 1 disorder (HCC)   . Chlamydia   . Depression    doing well  . Gonorrhea   . Headache(784.0)   . Herpes    Last outbreak August 2014  . Infection    UTI  . Onset of menses    age of 61, regular, lasting 3 to 4 days, medium to heavy flow  . Pre-eclampsia   . Pregnancy induced hypertension   . Preterm labor   . Vaginal Pap smear, abnormal    colpo, ok since    Patient Active Problem List   Diagnosis Date Noted  . Normal pregnancy in multigravida in third trimester 03/02/2015  . Fetal intolerance to labor, delivered, current hospitalization 03/02/2015  . Abnormal findings on antenatal  screening   . Maternal care for poor fetal growth   . [redacted] weeks gestation of pregnancy   . Poor fetal growth affecting management of mother in third trimester   . Generalized pruritus 12/25/2014  . Previous preterm delivery in second trimester, antepartum   . Abnormal quad screen   . Late prenatal care 10/29/2014  . Urinary tract infection, site not specified 02/26/2013  . Vitamin D deficiency 02/26/2013  . Vaginitis and vulvovaginitis, unspecified 02/26/2013  . H/O herpes genitalis 01/09/2013  . History of depressed bipolar disorder (HCC) 01/09/2013    Past Surgical History:  Procedure Laterality Date  . CESAREAN SECTION N/A 03/02/2015   Procedure: CESAREAN SECTION;  Surgeon: Reva Bores, MD;  Location: [facility];  Service: Obstetrics;  Laterality: N/A;  . COLPOSCOPY    . VAGINAL DELIVERY     X3  . WISDOM TOOTH EXTRACTION      OB History    Gravida Para Term Preterm AB Living   7 5 3 2 2 5    SAB TAB Ectopic Multiple Live Births   2     0 5       Home Medications    Prior to Admission medications   Medication Sig Start Date End Date Taking? Authorizing Provider  acetaminophen (TYLENOL) 500 MG tablet Take 1,000 mg by mouth every 6 (six) hours as needed for  mild pain.    Historical Provider, MD  ibuprofen (ADVIL,MOTRIN) 600 MG tablet Take 1 tablet (600 mg total) by mouth every 6 (six) hours. 03/05/15   Levie Heritage, DO  NIFEdipine (PROCARDIA) 20 MG capsule Take 1 capsule (20 mg total) by mouth 2 (two) times daily at 10 am and 4 pm. 01/26/15   Montez Morita, CNM  oxyCODONE-acetaminophen (PERCOCET/ROXICET) 5-325 MG per tablet Take 1-2 tablets by mouth every 4 (four) hours as needed. 03/05/15   Levie Heritage, DO  Prenatal Vit-Fe Fumarate-FA (PRENATAL COMPLETE) 14-0.4 MG TABS Take 1 tablet by mouth daily. 10/09/14   Mellody Drown, PA-C  Prenatal Vit-Min-FA-Fish Oil ([facility] PRENATAL GUMMY PO) Take 2 each by mouth daily.    Historical Provider, MD    Family History Family History    Problem Relation Age of Onset  . COPD Father   . Prostate cancer Father   . Glaucoma Father   . Cataracts Father   . Hypertension Father   . Hypertension Maternal Grandmother   . Diabetes Maternal Grandmother   . Arthritis    . Asthma    . Glaucoma    . Heart disease    . Hypertension    . Prostate cancer    . Cataracts      Social History Social History  Substance Use Topics  . Smoking status: Former Smoker    Types: Cigarettes    Quit date: 12/20/2012  . Smokeless tobacco: Former Neurosurgeon     Comment: vapor-quit  . Alcohol use No     Allergies   Patient has no known allergies.   Review of Systems Review of Systems  Musculoskeletal: Positive for arthralgias and joint swelling.  Skin: Negative for color change, rash and wound.  Neurological: Negative for weakness and numbness.     Physical Exam Updated Vital Signs BP 109/58 (BP Location: Right Arm)   Pulse 71   Temp 98.2 F (36.8 C) (Oral)   Resp 18   LMP 01/09/2017   SpO2 100%   Physical Exam  Constitutional: She is oriented to person, place, and time. She appears well-developed and well-nourished.  HENT:  Head: Normocephalic and atraumatic.  Eyes: Conjunctivae and EOM are normal. Right eye exhibits no discharge. Left eye exhibits no discharge. No scleral icterus.  Neck: Normal range of motion. Neck supple.  Cardiovascular: Normal rate and intact distal pulses.   Pulmonary/Chest: Effort normal.  Musculoskeletal: She exhibits tenderness. She exhibits no edema.       Right hand: She exhibits decreased range of motion (right 2nd PIP), tenderness and swelling (mild swelling to right 2nd PIP joint). She exhibits normal capillary refill, no deformity and no laceration. Normal sensation noted. Normal strength noted.       Hands: Mild swelling and tenderness present to right second PIP joint. Decreased range of motion of right second PIP joint due to reported pain, patient only able to active and passively flex PIP  joint approximately 20. Full range of motion of right second DIP and PIP joints. Remaining hand neurovascularly intact. 2+ radial pulse. Sensation grossly intact. Cap refill less than 2. No erythema, warmth or rash present.  Neurological: She is alert and oriented to person, place, and time.  Skin: Skin is warm and dry. Capillary refill takes less than 2 seconds.  Nursing note and vitals reviewed.    ED Treatments / Results  DIAGNOSTIC STUDIES: Oxygen Saturation is 100% on RA, normal by my interpretation.   COORDINATION OF CARE: 6:10  PM- Will re X-Ray the right index finger. Pt verbalizes understanding and agrees to plan.  Medications - No data to display  Labs (all labs ordered are listed, but only abnormal results are displayed) Labs Reviewed - No data to display  EKG  EKG Interpretation None       Radiology Dg Finger Index Right  Result Date: 02/02/2017 CLINICAL DATA:  Right index finger injury. EXAM: RIGHT INDEX FINGER 2+V COMPARISON:  Right hand radiographs 01/09/2017. FINDINGS: Soft swelling is present at the PIP joint. There is no underlying fracture. The joint is located. IMPRESSION: Soft tissue swelling at the PIP joint without underlying fracture or dislocation. Electronically Signed   By: Marin Robertshristopher  Mattern M.D.   On: 02/02/2017 18:39    Procedures Procedures (including critical care time)  Medications Ordered in ED Medications - No data to display   Initial Impression / Assessment and Plan / ED Course  I have reviewed the triage vital signs and the nursing notes.  Pertinent labs & imaging results that were available during my care of the patient were reviewed by me and considered in my medical decision making (see chart for details).     Patient presents with right index finger pain and swelling intermittently over the past month after hitting her hand against something. Reports initially being seen in the ED on 2/18 after injury, negative x-ray and was  discharged home with splint. Denies any other recent trauma or injury. VSS. Exam revealed mild swelling and tenderness over right second PIP joint with decreased range of motion with flexion due to reported pain. Remaining right hand neurovascularly intact. No deformity present. Repeat right index finger x-ray revealed mild soft tissue swelling, otherwise no evidence of fracture or dislocation. Clinical presentation unconcerning for tenosynovitis or infection. Plan to discharge patient home with symptomatic treatment including pain meds, NSAIDs and RICE protocol. Patient given contact information to follow up with hand surgery for further evaluation. Discussion return precautions.   I personally performed the services described in this documentation, which was scribed in my presence. The recorded information has been reviewed and is accurate.   Final Clinical Impressions(s) / ED Diagnoses   Final diagnoses:  Injury of finger of right hand, initial encounter    New Prescriptions New Prescriptions   No medications on file     Barrett Henleicole Elizabeth Nadeau, PA-C 02/02/17 1900    Canary Brimhristopher J Tegeler, MD 02/02/17 2044

## 2017-02-02 NOTE — ED Triage Notes (Signed)
Pt reports injuring right index finger on 2/18 and still has pain and difficulty bending her finger.

## 2017-02-02 NOTE — Discharge Instructions (Signed)
Take your medication as prescribed as needed for pain relief. I recommend continuing to rest, elevate and ice your right hand for 15-20 minutes 3-4 times daily.  Follow up with the hand doctor listed below in the next week for follow up evaluation.  Please return to the Emergency Department if symptoms worsen or new onset of fever, worsening swelling, redness, decreased range of motion, worsening/spreading pain.

## 2017-11-18 ENCOUNTER — Ambulatory Visit: Payer: Medicare Other | Admitting: Obstetrics & Gynecology

## 2018-05-18 ENCOUNTER — Encounter: Payer: Self-pay | Admitting: Obstetrics and Gynecology

## 2018-05-18 ENCOUNTER — Other Ambulatory Visit (HOSPITAL_COMMUNITY)
Admission: RE | Admit: 2018-05-18 | Discharge: 2018-05-18 | Disposition: A | Discharge status: Home / Self Care | Payer: Medicare Other | Source: Ambulatory Visit | Attending: Obstetrics and Gynecology | Admitting: Obstetrics and Gynecology

## 2018-05-18 ENCOUNTER — Ambulatory Visit (INDEPENDENT_AMBULATORY_CARE_PROVIDER_SITE_OTHER): Payer: Medicare Other | Admitting: Obstetrics and Gynecology

## 2018-05-18 VITALS — BP 106/70 | HR 69 | Wt 153.4 lb

## 2018-05-18 DIAGNOSIS — N920 Excessive and frequent menstruation with regular cycle: Secondary | ICD-10-CM

## 2018-05-18 DIAGNOSIS — N946 Dysmenorrhea, unspecified: Secondary | ICD-10-CM | POA: Insufficient documentation

## 2018-05-18 DIAGNOSIS — Z114 Encounter for screening for human immunodeficiency virus [HIV]: Secondary | ICD-10-CM

## 2018-05-18 NOTE — Progress Notes (Signed)
NEED PAP SMEAR

## 2018-05-18 NOTE — Progress Notes (Signed)
Obstetrics and Gynecology New Patient Evaluation  Appointment Date: 05/18/2018  OBGYN Clinic: [facility]  Primary Care Provider: Patient, No Pcp Per  Referring Provider: self  Chief Complaint: concern for ovarian cyst, dysmenorrhea and menorrhaiga History of Present Illness: Tammy Cole is a 32 y.o. African-American [phone] (Patient's last menstrual period was 04/24/2018.), seen for the above chief complaint. Her past medical history is significant for h/o c-section x 1 and BTL.  Patient states after her last child was born via c/s for NRFHTs that she's had worsening periods that are both heavy and painful. She states it went from 3 days to 7-8d. She hasn't tried anything for it.   No  fevers, chills, nausea, vomiting, abdominal pain, dysuria, hematuria, vaginal itching, dyspareunia, diarrhea, constipation, blood in BMs  Review of Systems: as noted in the History of Present Illness.   Past Medical History:  Past Medical History:  Diagnosis Date  . Anemia   . Anxiety   . Asthma   . Bipolar 1 disorder (HCC)   . Chlamydia   . Depression    doing well  . Gonorrhea   . Headache(784.0)   . Herpes    Last outbreak August 2014  . Infection    UTI  . Onset of menses    age of 23, regular, lasting 3 to 4 days, medium to heavy flow  . Pre-eclampsia   . Pregnancy induced hypertension   . Preterm labor   . Vaginal Pap smear, abnormal    colpo, ok since    Past Surgical History:  Past Surgical History:  Procedure Laterality Date  . CESAREAN SECTION N/A 03/02/2015   Procedure: CESAREAN SECTION;  Surgeon: Reva Bores, MD;  Location: [facility];  Service: Obstetrics;  Laterality: N/A;  . COLPOSCOPY    . VAGINAL DELIVERY     X3  . WISDOM TOOTH EXTRACTION      Past Obstetrical History:  OB History  Gravida Para Term Preterm AB Living  7 5 3 2 2 5   SAB TAB Ectopic Multiple Live Births  2     0 5    # Outcome Date GA Lbr Len/2nd Weight Sex  Delivery Anes PTL Lv  7 Term 03/02/15 [redacted]w[redacted]d  3 lb 10.9 oz (1.67 kg) M CS-LVertical Gen  LIV  6 Term 08/27/13 [redacted]w[redacted]d 11:13 / 00:18  M Vag-Spont EPI  LIV     Birth Comments: caput  5 Term 03/31/10 [redacted]w[redacted]d  6 lb 1 oz (2.75 kg) M Vag-Spont   LIV     Birth Comments: No complications  4 Preterm 02/10/09 [redacted]w[redacted]d  6 lb 5 oz (2.863 kg) M Vag-Spont  Y LIV  3 SAB 2008          2 Preterm 04/29/99 [redacted]w[redacted]d  4 lb 7 oz (2.013 kg) F Vag-Spont  Y LIV  1 SAB             Past Gynecological History: As per HPI. History of Pap Smear(s): Yes.   Last pap 2014, which was NILM  Social History:  Social History   Socioeconomic History  . Marital status: Single    Spouse name: Not on file  . Number of children: Not on file  . Years of education: Not on file  . Highest education level: Not on file  Occupational History  . Occupation: Disabled  Social Needs  . Financial resource strain: Not on file  . Food insecurity:    Worry: Not on file  Inability: Not on file  . Transportation needs:    Medical: Not on file    Non-medical: Not on file  Tobacco Use  . Smoking status: Former Smoker    Types: Cigarettes    Last attempt to quit: 12/20/2012    Years since quitting: 5.4  . Smokeless tobacco: Former NeurosurgeonUser  . Tobacco comment: vapor-quit  Substance and Sexual Activity  . Alcohol use: No  . Drug use: No  . Sexual activity: Yes    Birth control/protection: None    Comment: Feb 16, 2015 @ 1830  Lifestyle  . Physical activity:    Days per week: Not on file    Minutes per session: Not on file  . Stress: Not on file  Relationships  . Social connections:    Talks on phone: Not on file    Gets together: Not on file    Attends religious service: Not on file    Active member of club or organization: Not on file    Attends meetings of clubs or organizations: Not on file    Relationship status: Not on file  . Intimate partner violence:    Fear of current or ex partner: Not on file    Emotionally abused: Not  on file    Physically abused: Not on file    Forced sexual activity: Not on file  Other Topics Concern  . Not on file  Social History Narrative  . Not on file    Family History:  Family History  Problem Relation Age of Onset  . COPD Father   . Prostate cancer Father   . Glaucoma Father   . Cataracts Father   . Hypertension Father   . Hypertension Maternal Grandmother   . Diabetes Maternal Grandmother   . Arthritis Unknown   . Asthma Unknown   . Glaucoma Unknown   . Heart disease Unknown   . Hypertension Unknown   . Prostate cancer Unknown   . Cataracts Unknown     Medications Tammy Cole had no medications administered during this visit. Current Outpatient Medications  Medication Sig Dispense Refill  . acetaminophen (TYLENOL) 500 MG tablet Take 1,000 mg by mouth every 6 (six) hours as needed for mild pain.    Marland Kitchen. ibuprofen (ADVIL,MOTRIN) 600 MG tablet Take 1 tablet (600 mg total) by mouth every 6 (six) hours. (Patient not taking: Reported on 05/18/2018) 30 tablet 0  . naproxen (NAPROSYN) 500 MG tablet Take 1 tablet (500 mg total) by mouth 2 (two) times daily. (Patient not taking: Reported on 05/18/2018) 30 tablet 0   No current facility-administered medications for this visit.     Allergies Patient has no known allergies.   Physical Exam:  BP 106/70   Pulse 69   Wt 153 lb 6.4 oz (69.6 kg)   LMP 04/24/2018   BMI 26.33 kg/m  Body mass index is 26.33 kg/m. General appearance: Well nourished, well developed female in no acute distress.  Cardiovascular: normal s1 and s2.  No murmurs, rubs or gallops. Respiratory:  Clear to auscultation bilateral. Normal respiratory effort Abdomen: positive bowel sounds and no masses, hernias; diffusely non tender to palpation, non distended Neuro/Psych:  Normal mood and affect.  Skin:  Warm and dry.  Lymphatic:  No inguinal lymphadenopathy.   Pelvic exam: is not limited by body habitus EGBUS: within normal limits, Vagina:  within normal limits and with no blood or discharge in the vault, Cervix: normal appearing cervix without tenderness, discharge or lesions. Uterus:  Mobile, moderate decensus on exam, 8wk sized and non tender and Adnexa:  normal adnexa and no mass, fullness, tenderness Rectovaginal: deferred  Laboratory: none  Radiology: none  Assessment: pt stable  Plan:  1. Dysmenorrhea Basic labs and u/s and RTC after early cycle u/s to d/w pt re: options. Consider embx if for a surgical option.   - US PELVIC COMPLETE WITH TRANSVAGINAL; Future - Cytology - PAP - CBC - RPR - Hepatitis B Surface AntiGEN - Hepatitis C Antibody - HIV antibody (with reflex)  RTC 68m  Cornelia Copa MD Attending [facility])

## 2018-05-19 LAB — CBC
Hematocrit: 33.4 % — ABNORMAL LOW (ref 34.0–46.6)
Hemoglobin: 10.6 g/dL — ABNORMAL LOW (ref 11.1–15.9)
MCH: 28.6 pg (ref 26.6–33.0)
MCHC: 31.7 g/dL (ref 31.5–35.7)
MCV: 90 fL (ref 79–97)
Platelets: 182 10*3/uL (ref 150–450)
RBC: 3.71 x10E6/uL — ABNORMAL LOW (ref 3.77–5.28)
RDW: 16.2 % — AB (ref 12.3–15.4)
WBC: 4.4 10*3/uL (ref 3.4–10.8)

## 2018-05-19 LAB — HEPATITIS C ANTIBODY: Hep C Virus Ab: <0.1 s/co ratio (ref 0.0–0.9)

## 2018-05-19 LAB — RPR: RPR: NONREACTIVE

## 2018-05-19 LAB — CYTOLOGY - PAP
Chlamydia: NEGATIVE
Diagnosis: NEGATIVE
HPV 16/18/45 genotyping: NEGATIVE
HPV: DETECTED — AB
Neisseria Gonorrhea: NEGATIVE
TRICH (WINDOWPATH): POSITIVE — AB

## 2018-05-19 LAB — HIV ANTIBODY (ROUTINE TESTING W REFLEX): HIV Screen 4th Generation wRfx: NONREACTIVE

## 2018-05-19 LAB — HEPATITIS B SURFACE ANTIGEN: HEP B S AG: NEGATIVE

## 2018-05-22 ENCOUNTER — Other Ambulatory Visit: Payer: Self-pay

## 2018-05-22 ENCOUNTER — Encounter: Payer: Self-pay | Admitting: Obstetrics and Gynecology

## 2018-05-22 DIAGNOSIS — R8789 Other abnormal findings in specimens from female genital organs: Secondary | ICD-10-CM | POA: Insufficient documentation

## 2018-05-22 DIAGNOSIS — R87618 Other abnormal cytological findings on specimens from cervix uteri: Secondary | ICD-10-CM | POA: Insufficient documentation

## 2018-05-22 DIAGNOSIS — A599 Trichomoniasis, unspecified: Secondary | ICD-10-CM | POA: Insufficient documentation

## 2018-05-22 MED ORDER — METRONIDAZOLE 500 MG PO TABS: 500.0000 mg | ORAL_TABLET | Freq: Two times a day (BID) | ORAL | 0 refills | Status: DC

## 2018-05-22 NOTE — Telephone Encounter (Signed)
Patient has been informed of positive trich results and the need to be treated. Inform patient to not have sex for 7-10 days or until her and partner have been treated. Medications called in per procotol to [facility] [address] [place].

## 2018-06-07 ENCOUNTER — Ambulatory Visit (HOSPITAL_COMMUNITY): Payer: Medicare Other | Attending: Obstetrics and Gynecology

## 2018-06-22 ENCOUNTER — Encounter: Payer: Self-pay | Admitting: Obstetrics and Gynecology

## 2018-06-22 ENCOUNTER — Ambulatory Visit: Payer: Medicare Other | Admitting: Obstetrics and Gynecology

## 2018-06-22 NOTE — Progress Notes (Signed)
Patient did not keep her GYN appointment for 06/22/2018.  Tammy Cole, Jr MD Attending [facility]ulty [facility])

## 2019-11-06 ENCOUNTER — Emergency Department (HOSPITAL_COMMUNITY)
Admission: EM | Admit: 2019-11-06 | Discharge: 2019-11-06 | Discharge status: Against Medical Advice | Payer: Medicaid Other | Attending: Emergency Medicine | Admitting: Emergency Medicine

## 2019-11-06 ENCOUNTER — Other Ambulatory Visit: Payer: Self-pay

## 2019-11-06 DIAGNOSIS — Z5321 Procedure and treatment not carried out due to patient leaving prior to being seen by health care provider: Secondary | ICD-10-CM | POA: Diagnosis not present

## 2019-11-06 DIAGNOSIS — M7918 Myalgia, other site: Secondary | ICD-10-CM | POA: Diagnosis present

## 2019-11-06 LAB — BASIC METABOLIC PANEL
Anion gap: 9 (ref 5–15)
BUN: 14 mg/dL (ref 6–20)
CO2: 23 mmol/L (ref 22–32)
Calcium: 8.9 mg/dL (ref 8.9–10.3)
Chloride: 103 mmol/L (ref 98–111)
Creatinine, Ser: 0.92 mg/dL (ref 0.44–1.00)
GFR calc Af Amer: >60 mL/min (ref >60)
GFR calc non Af Amer: >60 mL/min (ref >60)
Glucose, Bld: 100 mg/dL — ABNORMAL HIGH (ref 70–99)
Potassium: 3.7 mmol/L (ref 3.5–5.1)
Sodium: 135 mmol/L (ref 135–145)

## 2019-11-06 LAB — CBC
HCT: 31.7 % — ABNORMAL LOW (ref 36.0–46.0)
Hemoglobin: 9.4 g/dL — ABNORMAL LOW (ref 12.0–15.0)
MCH: 22.8 pg — ABNORMAL LOW (ref 26.0–34.0)
MCHC: 29.7 g/dL — ABNORMAL LOW (ref 30.0–36.0)
MCV: 76.9 fL — ABNORMAL LOW (ref 80.0–100.0)
Platelets: 210 10*3/uL (ref 150–400)
RBC: 4.12 MIL/uL (ref 3.87–5.11)
RDW: 17.6 % — ABNORMAL HIGH (ref 11.5–15.5)
WBC: 5.4 10*3/uL (ref 4.0–10.5)
nRBC: 0 % (ref 0.0–0.2)

## 2019-11-06 NOTE — ED Triage Notes (Signed)
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 States that her arms began hurting, then her legs began hurting and then her back. Went to see her dr. Wilburn Mylar for same and prescribed medication. States that her head started hurting after taking the medication. C/o burning and tingling in legs; states sounds and lights bother her right now.

## 2019-11-06 NOTE — ED Notes (Signed)
Family at bedside. 

## 2019-11-06 NOTE — ED Notes (Signed)
Pt asked to be wheeled to front door because her ride was coming to get her.

## 2019-11-06 NOTE — ED Notes (Signed)
Called pt no answer x1 

## 2020-08-19 ENCOUNTER — Other Ambulatory Visit: Payer: Medicaid Other

## 2020-08-19 DIAGNOSIS — Z20822 Contact with and (suspected) exposure to covid-19: Secondary | ICD-10-CM

## 2020-08-20 LAB — SARS-COV-2, NAA 2 DAY TAT

## 2020-08-20 LAB — NOVEL CORONAVIRUS, NAA: SARS-CoV-2, NAA: NOT DETECTED

## 2020-09-18 ENCOUNTER — Encounter (HOSPITAL_COMMUNITY): Payer: Self-pay | Admitting: *Deleted

## 2020-09-18 ENCOUNTER — Other Ambulatory Visit: Payer: Self-pay

## 2020-09-18 ENCOUNTER — Emergency Department (HOSPITAL_COMMUNITY)
Admission: EM | Admit: 2020-09-18 | Discharge: 2020-09-18 | Disposition: A | Discharge status: Home / Self Care | Payer: Medicaid Other | Attending: Emergency Medicine | Admitting: Emergency Medicine

## 2020-09-18 DIAGNOSIS — R110 Nausea: Secondary | ICD-10-CM | POA: Insufficient documentation

## 2020-09-18 DIAGNOSIS — Z5321 Procedure and treatment not carried out due to patient leaving prior to being seen by health care provider: Secondary | ICD-10-CM | POA: Diagnosis not present

## 2020-09-18 DIAGNOSIS — R1031 Right lower quadrant pain: Secondary | ICD-10-CM | POA: Insufficient documentation

## 2020-09-18 LAB — COMPREHENSIVE METABOLIC PANEL
ALT: 10 U/L (ref 0–44)
AST: 23 U/L (ref 15–41)
Albumin: 3.5 g/dL (ref 3.5–5.0)
Alkaline Phosphatase: 38 U/L (ref 38–126)
Anion gap: 10 (ref 5–15)
BUN: 11 mg/dL (ref 6–20)
CO2: 21 mmol/L — ABNORMAL LOW (ref 22–32)
Calcium: 8.8 mg/dL — ABNORMAL LOW (ref 8.9–10.3)
Chloride: 107 mmol/L (ref 98–111)
Creatinine, Ser: 0.94 mg/dL (ref 0.44–1.00)
GFR, Estimated: >60 mL/min (ref >60)
Glucose, Bld: 105 mg/dL — ABNORMAL HIGH (ref 70–99)
Potassium: 3.8 mmol/L (ref 3.5–5.1)
Sodium: 138 mmol/L (ref 135–145)
Total Bilirubin: 0.5 mg/dL (ref 0.3–1.2)
Total Protein: 6.6 g/dL (ref 6.5–8.1)

## 2020-09-18 LAB — CBC
HCT: 30.4 % — ABNORMAL LOW (ref 36.0–46.0)
Hemoglobin: 8.9 g/dL — ABNORMAL LOW (ref 12.0–15.0)
MCH: 23.8 pg — ABNORMAL LOW (ref 26.0–34.0)
MCHC: 29.3 g/dL — ABNORMAL LOW (ref 30.0–36.0)
MCV: 81.3 fL (ref 80.0–100.0)
Platelets: 226 10*3/uL (ref 150–400)
RBC: 3.74 MIL/uL — ABNORMAL LOW (ref 3.87–5.11)
RDW: 21.2 % — ABNORMAL HIGH (ref 11.5–15.5)
WBC: 5.5 10*3/uL (ref 4.0–10.5)
nRBC: 0 % (ref 0.0–0.2)

## 2020-09-18 LAB — LIPASE, BLOOD: Lipase: 46 U/L (ref 11–51)

## 2020-09-18 LAB — I-STAT BETA HCG BLOOD, ED (MC, WL, AP ONLY): I-stat hCG, quantitative: <5 m[IU]/mL (ref <5)

## 2020-09-18 LAB — URINALYSIS, ROUTINE W REFLEX MICROSCOPIC
Bilirubin Urine: NEGATIVE
Glucose, UA: NEGATIVE mg/dL
Hgb urine dipstick: NEGATIVE
Ketones, ur: 5 mg/dL — AB
Leukocytes,Ua: NEGATIVE
Nitrite: NEGATIVE
Protein, ur: NEGATIVE mg/dL
Specific Gravity, Urine: 1.027 (ref 1.005–1.030)
pH: 5 (ref 5.0–8.0)

## 2020-09-18 NOTE — ED Notes (Signed)
Called pt x3, no answer.  

## 2020-09-18 NOTE — ED Triage Notes (Signed)
Right lower abdominal pain, associated with nausea.

## 2022-02-08 ENCOUNTER — Telehealth: Payer: Medicaid Other | Admitting: Physician Assistant

## 2022-02-08 DIAGNOSIS — R3989 Other symptoms and signs involving the genitourinary system: Secondary | ICD-10-CM | POA: Diagnosis not present

## 2022-02-08 MED ORDER — SULFAMETHOXAZOLE-TRIMETHOPRIM 800-160 MG PO TABS: 1.0000 | ORAL_TABLET | Freq: Two times a day (BID) | ORAL | 0 refills | Status: AC

## 2022-02-08 NOTE — Patient Instructions (Signed)
?Tammy Cole, thank you for joining Tammy Loveless, PA-C for today's virtual visit.  While this provider is not your primary care provider (PCP), if your PCP is located in our provider database this encounter information will be shared with them immediately following your visit. ? ?Consent: ?(Patient) Tammy Cole provided verbal consent for this virtual visit at the beginning of the encounter. ? ?Current Medications: ? ?Current Outpatient Medications:  ?  sulfamethoxazole-trimethoprim (BACTRIM DS) 800-160 MG tablet, Take 1 tablet by mouth 2 (two) times daily., Disp: 10 tablet, Rfl: 0 ?  acetaminophen (TYLENOL) 500 MG tablet, Take 1,000 mg by mouth every 6 (six) hours as needed for mild pain., Disp: , Rfl:  ?  ibuprofen (ADVIL,MOTRIN) 600 MG tablet, Take 1 tablet (600 mg total) by mouth every 6 (six) hours. (Patient not taking: Reported on 05/18/2018), Disp: 30 tablet, Rfl: 0 ?  metroNIDAZOLE (FLAGYL) 500 MG tablet, Take 1 tablet (500 mg total) by mouth 2 (two) times daily., Disp: 14 tablet, Rfl: 0 ?  naproxen (NAPROSYN) 500 MG tablet, Take 1 tablet (500 mg total) by mouth 2 (two) times daily. (Patient not taking: Reported on 05/18/2018), Disp: 30 tablet, Rfl: 0  ? ?Medications ordered in this encounter:  ?Meds ordered this encounter  ?Medications  ? sulfamethoxazole-trimethoprim (BACTRIM DS) 800-160 MG tablet  ?  Sig: Take 1 tablet by mouth 2 (two) times daily.  ?  Dispense:  10 tablet  ?  Refill:  0  ?  Order Specific Question:   Supervising Provider  ?  Answer:   Eber Hong [[id number]]  ?  ? ?*If you need refills on other medications prior to your next appointment, please contact your pharmacy* ? ?Follow-Up: ?Call back or seek an in-person evaluation if the symptoms worsen or if the condition fails to improve as anticipated. ? ?Other Instructions ?Urinary Tract Infection, Adult ?A urinary tract infection (UTI) is an infection of any part of the urinary tract. The urinary tract includes: ?The  kidneys. ?The ureters. ?The bladder. ?The urethra. ?These organs make, store, and get rid of pee (urine) in the body. ?What are the causes? ?This infection is caused by germs (bacteria) in your genital area. These germs grow and cause swelling (inflammation) of your urinary tract. ?What increases the risk? ?The following factors may make you more likely to develop this condition: ?Using a small, thin tube (catheter) to drain pee. ?Not being able to control when you pee or poop (incontinence). ?Being female. If you are female, these things can increase the risk: ?Using these methods to prevent pregnancy: ?A medicine that kills sperm (spermicide). ?A device that blocks sperm (diaphragm). ?Having low levels of a female hormone (estrogen). ?Being pregnant. ?You are more likely to develop this condition if: ?You have genes that add to your risk. ?You are sexually active. ?You take antibiotic medicines. ?You have trouble peeing because of: ?A prostate that is bigger than normal, if you are female. ?A blockage in the part of your body that drains pee from the bladder. ?A kidney stone. ?A nerve condition that affects your bladder. ?Not getting enough to drink. ?Not peeing often enough. ?You have other conditions, such as: ?Diabetes. ?A weak disease-fighting system (immune system). ?Sickle cell disease. ?Gout. ?Injury of the spine. ?What are the signs or symptoms? ?Symptoms of this condition include: ?Needing to pee right away. ?Peeing small amounts often. ?Pain or burning when peeing. ?Blood in the pee. ?Pee that smells bad or not like normal. ?Trouble peeing. ?Pee  that is cloudy. ?Fluid coming from the vagina, if you are female. ?Pain in the belly or lower back. ?Other symptoms include: ?Vomiting. ?Not feeling hungry. ?Feeling mixed up (confused). This may be the first symptom in older adults. ?Being tired and grouchy (irritable). ?A fever. ?Watery poop (diarrhea). ?How is this treated? ?Taking antibiotic medicine. ?Taking  other medicines. ?Drinking enough water. ?In some cases, you may need to see a specialist. ?Follow these instructions at home: ?Medicines ?Take over-the-counter and prescription medicines only as told by your doctor. ?If you were prescribed an antibiotic medicine, take it as told by your doctor. Do not stop taking it even if you start to feel better. ?General instructions ?Make sure you: ?Pee until your bladder is empty. ?Do not hold pee for a long time. ?Empty your bladder after sex. ?Wipe from front to back after peeing or pooping if you are a female. Use each tissue one time when you wipe. ?Drink enough fluid to keep your pee pale yellow. ?Keep all follow-up visits. ?Contact a doctor if: ?You do not get better after 1-2 days. ?Your symptoms go away and then come back. ?Get help right away if: ?You have very bad back pain. ?You have very bad pain in your lower belly. ?You have a fever. ?You have chills. ?You feeling like you will vomit or you vomit. ?Summary ?A urinary tract infection (UTI) is an infection of any part of the urinary tract. ?This condition is caused by germs in your genital area. ?There are many risk factors for a UTI. ?Treatment includes antibiotic medicines. ?Drink enough fluid to keep your pee pale yellow. ?This information is not intended to replace advice given to you by your health care provider. Make sure you discuss any questions you have with your health care provider. ?Document Revised: 06/20/2020 Document Reviewed: 06/20/2020 ?Elsevier Patient Education ? 2022 Elsevier Inc. ? ? ? ?If you have been instructed to have an in-person evaluation today at a local Urgent Care facility, please use the link below. It will take you to a list of all of our available [facility], including address, phone number and hours of operation. Please do not delay care.  ?[facility] ? ?If you or a family member do not have a primary care provider, use the link below to schedule a  visit and establish care. When you choose a [facility] primary care physician or advanced practice provider, you gain a long-term partner in health. ?Find a Primary Care Provider ? ?Learn more about [facility]'s in-office and virtual care options: ? - Get Care Now ?

## 2022-02-08 NOTE — Progress Notes (Signed)
?Virtual Visit Consent  ? ?[name], you are scheduled for a virtual visit with a [facility] provider today.   ?  ?Just as with appointments in the office, your consent must be obtained to participate.  Your consent will be active for this visit and any virtual visit you may have with one of our providers in the next 365 days.   ?  ?If you have a MyChart account, a copy of this consent can be sent to you electronically.  All virtual visits are billed to your insurance company just like a traditional visit in the office.   ? ?As this is a virtual visit, video technology does not allow for your provider to perform a traditional examination.  This may limit your provider's ability to fully assess your condition.  If your provider identifies any concerns that need to be evaluated in person or the need to arrange testing (such as labs, EKG, etc.), we will make arrangements to do so.   ?  ?Although advances in technology are sophisticated, we cannot ensure that it will always work on either your end or our end.  If the connection with a video visit is poor, the visit may have to be switched to a telephone visit.  With either a video or telephone visit, we are not always able to ensure that we have a secure connection.    ? ?I need to obtain your verbal consent now.   Are you willing to proceed with your visit today?  ?  ?[name] has provided verbal consent on 02/08/2022 for a virtual visit (video or telephone). ?  ?[name], PA-C  ? ?Date: 02/08/2022 8:01 AM ? ? ?Virtual Visit via Video Note  ? ?IMargaretann Loveless, connected with  [name]  ([id number], 1986/06/29) on 02/08/22 at  7:45 AM EDT by a video-enabled telemedicine application and verified that I am speaking with the correct person using two identifiers. ? ?Location: ?Patient: Virtual Visit Location Patient: Mobile ?Provider: Virtual Visit Location Provider: Office/Clinic ?  ?I discussed the limitations of evaluation and  management by telemedicine and the availability of in person appointments. The patient expressed understanding and agreed to proceed.   ? ?History of Present Illness: ?Tammy Cole is a 36 y.o. who identifies as a female who was assigned female at birth, and is being seen today for possible UTI. ? ?HPI: Urinary Tract Infection  ?This is a new problem. The current episode started yesterday. The problem has been unchanged. The quality of the pain is described as aching. There has been no fever. Associated symptoms include frequency, hesitancy and urgency. Pertinent negatives include no chills, discharge, flank pain, hematuria, nausea or vomiting. She has tried increased fluids (AZO) for the symptoms. The treatment provided no relief. There is no history of catheterization, recurrent UTIs or a urological procedure.   ? ? ?Problems:  ?Patient Active Problem List  ? Diagnosis Date Noted  ? Trichomoniasis 05/22/2018  ? Pap smear abnormality of cervix/human papillomavirus (HPV) positive 05/22/2018  ? Vitamin D deficiency 02/26/2013  ? History of depressed bipolar disorder (HCC) 01/09/2013  ?  ?Allergies: No Known Allergies ?Medications:  ?Current Outpatient Medications:  ?  sulfamethoxazole-trimethoprim (BACTRIM DS) 800-160 MG tablet, Take 1 tablet by mouth 2 (two) times daily., Disp: 10 tablet, Rfl: 0 ?  acetaminophen (TYLENOL) 500 MG tablet, Take 1,000 mg by mouth every 6 (six) hours as needed for mild pain., Disp: , Rfl:  ?  ibuprofen (ADVIL,MOTRIN) 600 MG tablet, Take 1 tablet (600 mg total) by mouth every 6 (six) hours. (Patient not taking: Reported on 05/18/2018), Disp: 30 tablet, Rfl: 0 ?  metroNIDAZOLE (FLAGYL) 500 MG tablet, Take 1 tablet (500 mg total) by mouth 2 (two) times daily., Disp: 14 tablet, Rfl: 0 ?  naproxen (NAPROSYN) 500 MG tablet, Take 1 tablet (500 mg total) by mouth 2 (two) times daily. (Patient not taking: Reported on 05/18/2018), Disp: 30 tablet, Rfl: 0 ? ?Observations/Objective: ?Patient is  well-developed, well-nourished in no acute distress.  ?Resting comfortably  ?Head is normocephalic, atraumatic.  ?No labored breathing.  ?Speech is clear and coherent with logical content.  ?Patient is alert and oriented at baseline.  ? ? ?Assessment and Plan: ?1. Suspected UTI ?- sulfamethoxazole-trimethoprim (BACTRIM DS) 800-160 MG tablet; Take 1 tablet by mouth 2 (two) times daily.  Dispense: 10 tablet; Refill: 0 ? ?- Worsening symptoms.  ?- Will treat empirically with Bactrim  ?- Continue to push fluids.  ?- She is to seek in person evaluation if symptoms do not improve or if they worsen.  ? ? ?Follow Up Instructions: ?I discussed the assessment and treatment plan with the patient. The patient was provided an opportunity to ask questions and all were answered. The patient agreed with the plan and demonstrated an understanding of the instructions.  A copy of instructions were sent to the patient via MyChart unless otherwise noted below.  ? ?Patient has requested to receive PHI (AVS, Work Notes, etc) pertaining to this video visit through e-mail as they are currently without active MyChart. They have voiced understand that email is not considered secure and their health information could be viewed by someone other than the patient.  ? ?The patient was advised to call back or seek an in-person evaluation if the symptoms worsen or if the condition fails to improve as anticipated. ? ?Time:  ?I spent 14 minutes with the patient via telehealth technology discussing the above problems/concerns.   ? ?[name], PA-C ?

## 2022-02-19 ENCOUNTER — Ambulatory Visit (HOSPITAL_COMMUNITY)
Admission: EM | Admit: 2022-02-19 | Discharge: 2022-02-19 | Disposition: A | Discharge status: Home / Self Care | Payer: Medicaid Other | Attending: Nurse Practitioner | Admitting: Nurse Practitioner

## 2022-02-19 ENCOUNTER — Telehealth: Payer: Medicaid Other | Admitting: Physician Assistant

## 2022-02-19 ENCOUNTER — Encounter (HOSPITAL_COMMUNITY): Payer: Self-pay

## 2022-02-19 DIAGNOSIS — N390 Urinary tract infection, site not specified: Secondary | ICD-10-CM | POA: Insufficient documentation

## 2022-02-19 LAB — POCT URINALYSIS DIPSTICK, ED / UC
Glucose, UA: 250 mg/dL — AB
Ketones, ur: 15 mg/dL — AB
Nitrite: POSITIVE — AB
Protein, ur: >=300 mg/dL — AB
Specific Gravity, Urine: 1.02 (ref 1.005–1.030)
Urobilinogen, UA: 4 mg/dL — ABNORMAL HIGH (ref 0.0–1.0)
pH: 5 (ref 5.0–8.0)

## 2022-02-19 MED ORDER — CIPROFLOXACIN HCL 500 MG PO TABS: 500.0000 mg | ORAL_TABLET | Freq: Two times a day (BID) | ORAL | 0 refills | Status: AC

## 2022-02-19 NOTE — ED Provider Notes (Signed)
?[facility] ? ? ? ?CSN: [id number] ?Arrival date & time: 02/19/22  0845 ? ? ?  ? ?History   ?Chief Complaint ?Chief Complaint  ?Patient presents with  ? Urinary Frequency  ? ? ?HPI ?Tammy Cole is a 36 y.o. female.  ? ?The patient is a 36 year old female who presents for urinary symptoms.  Patient's symptoms started almost 2 weeks ago.  She was initially seen for a virtual urgent care visit and prescribed Bactrim at that time.  Patient completed the prescription without relief.  Today she continues to have dysuria, urgency, frequency, suprapubic pressure, and has noted some chills.  She states that she has also had increased fatigue as she slept most of the day on yesterday.  She states that she was also up during most of the night due to feeling like she had to void.  She has been taking Azo for her symptoms.  She restarted the medication 1 day ago.  She has been eating and drinking normally. ? ?The history is provided by the patient.  ? ?Past Medical History:  ?Diagnosis Date  ? Anemia   ? Anxiety   ? Asthma   ? Bipolar 1 disorder (HCC)   ? Chlamydia   ? Depression   ? doing well  ? Gonorrhea   ? Headache(784.0)   ? Herpes   ? Last outbreak August 2014  ? Infection   ? UTI  ? Onset of menses   ? age of 23, regular, lasting 3 to 4 days, medium to heavy flow  ? Pre-eclampsia   ? Pregnancy induced hypertension   ? Preterm labor   ? Vaginal Pap smear, abnormal   ? colpo, ok since  ? ? ?Patient Active Problem List  ? Diagnosis Date Noted  ? Trichomoniasis 05/22/2018  ? Pap smear abnormality of cervix/human papillomavirus (HPV) positive 05/22/2018  ? Vitamin D deficiency 02/26/2013  ? History of depressed bipolar disorder (HCC) 01/09/2013  ? ? ?Past Surgical History:  ?Procedure Laterality Date  ? CESAREAN SECTION N/A 03/02/2015  ? Procedure: CESAREAN SECTION;  Surgeon: Reva Bores, MD;  Location: [facility];  Service: Obstetrics;  Laterality: N/A;  ? COLPOSCOPY    ? VAGINAL DELIVERY    ? X3  ? WISDOM TOOTH  EXTRACTION    ? ? ?OB History   ? ? Gravida  ?7  ? Para  ?5  ? Term  ?3  ? Preterm  ?2  ? AB  ?2  ? Living  ?5  ?  ? ? SAB  ?2  ? IAB  ?   ? Ectopic  ?   ? Multiple  ?0  ? Live Births  ?5  ?   ?  ?  ? ? ? ?Home Medications   ? ?Prior to Admission medications   ?Medication Sig Start Date End Date Taking? Authorizing Provider  ?ciprofloxacin (CIPRO) 500 MG tablet Take 1 tablet (500 mg total) by mouth 2 (two) times daily for 7 days. 02/19/22 02/26/22 Yes Johan Antonacci-Warren, Sadie Haber, NP  ?acetaminophen (TYLENOL) 500 MG tablet Take 1,000 mg by mouth every 6 (six) hours as needed for mild pain.    [provider]  ?ibuprofen (ADVIL,MOTRIN) 600 MG tablet Take 1 tablet (600 mg total) by mouth every 6 (six) hours. ?Patient not taking: Reported on 05/18/2018 03/05/15   Levie Heritage, DO  ?metroNIDAZOLE (FLAGYL) 500 MG tablet Take 1 tablet (500 mg total) by mouth 2 (two) times daily. 05/22/18   Pickens,  Billey Goslingharlie, MD  ?naproxen (NAPROSYN) 500 MG tablet Take 1 tablet (500 mg total) by mouth 2 (two) times daily. ?Patient not taking: Reported on 05/18/2018 02/02/17   Barrett HenleNadeau, Nicole Elizabeth, PA-C  ?sulfamethoxazole-trimethoprim (BACTRIM DS) 800-160 MG tablet Take 1 tablet by mouth 2 (two) times daily. 02/08/22   Margaretann LovelessBurnette, Jennifer M, PA-C  ? ? ?Family History ?Family History  ?Problem Relation Age of Onset  ? COPD Father   ? Prostate cancer Father   ? Glaucoma Father   ? Cataracts Father   ? Hypertension Father   ? Hypertension Maternal Grandmother   ? Diabetes Maternal Grandmother   ? Arthritis Other   ? Asthma Other   ? Glaucoma Other   ? Heart disease Other   ? Hypertension Other   ? Prostate cancer Other   ? Cataracts Other   ? ? ?Social History ?Social History  ? ?Tobacco Use  ? Smoking status: Former  ?  Types: Cigarettes  ?  Quit date: 12/20/2012  ?  Years since quitting: 9.1  ? Smokeless tobacco: Former  ? Tobacco comments:  ?  vapor-quit  ?Substance Use Topics  ? Alcohol use: No  ? Drug use: No  ? ? ? ?Allergies   ?Patient  has no known allergies. ? ? ?Review of Systems ?Review of Systems  ?Constitutional:  Positive for activity change, chills and fatigue.  ?Respiratory: Negative.    ?Cardiovascular: Negative.   ?Gastrointestinal: Negative.   ?Genitourinary:  Positive for dysuria, frequency and urgency. Negative for flank pain, pelvic pain, vaginal bleeding, vaginal discharge and vaginal pain.  ?     Suprapubic pressure  ?Musculoskeletal: Negative.   ?Skin: Negative.   ?Psychiatric/Behavioral: Negative.    ?All other systems reviewed and are negative. ? ? ?Physical Exam ?Triage Vital Signs ?ED Triage Vitals  ?Enc Vitals Group  ?   BP 02/19/22 0909 93/66  ?   Pulse Rate 02/19/22 0909 94  ?   Resp 02/19/22 0909 18  ?   Temp 02/19/22 0909 98.4 ?F (36.9 ?C)  ?   Temp Source 02/19/22 0909 Oral  ?   SpO2 02/19/22 0909 94 %  ?   Weight --   ?   Height --   ?   Head Circumference --   ?   Peak Flow --   ?   Pain Score 02/19/22 0907 3  ?   Pain Loc --   ?   Pain Edu? --   ?   Excl. in GC? --   ? ?No data found. ? ?Updated Vital Signs ?BP 93/66 (BP Location: Right Arm)   Pulse 94   Temp 98.4 ?F (36.9 ?C) (Oral)   Resp 18   SpO2 94%   Breastfeeding No  ? ?Visual Acuity ?Right Eye Distance:   ?Left Eye Distance:   ?Bilateral Distance:   ? ?Right Eye Near:   ?Left Eye Near:    ?Bilateral Near:    ? ?Physical Exam ?Vitals reviewed.  ?Constitutional:   ?   General: She is not in acute distress. ?   Appearance: Normal appearance.  ?HENT:  ?   Head: Normocephalic and atraumatic.  ?   Nose: Nose normal.  ?Cardiovascular:  ?   Rate and Rhythm: Regular rhythm.  ?   Pulses: Normal pulses.  ?   Heart sounds: Normal heart sounds.  ?Pulmonary:  ?   Effort: Pulmonary effort is normal.  ?   Breath sounds: Normal breath sounds.  ?Abdominal:  ?  General: Bowel sounds are normal.  ?   Palpations: Abdomen is soft.  ?   Tenderness: There is no abdominal tenderness. There is no right CVA tenderness, left CVA tenderness or guarding.  ?Skin: ?   General: Skin is  warm and dry.  ?   Capillary Refill: Capillary refill takes less than 2 seconds.  ?Neurological:  ?   Mental Status: She is alert and oriented to person, place, and time.  ?Psychiatric:     ?   Mood and Affect: Mood normal.     ?   Behavior: Behavior normal.  ? ? ? ?UC Treatments / Results  ?Labs ?(all labs ordered are listed, but only abnormal results are displayed) ?Labs Reviewed  ?POCT URINALYSIS DIPSTICK, ED / UC - Abnormal; Notable for the following components:  ?    Result Value  ? Glucose, UA 250 (*)   ? Bilirubin Urine MODERATE (*)   ? Ketones, ur 15 (*)   ? Hgb urine dipstick LARGE (*)   ? Protein, ur >=300 (*)   ? Urobilinogen, UA 4.0 (*)   ? Nitrite POSITIVE (*)   ? Leukocytes,Ua LARGE (*)   ? All other components within normal limits  ?URINE CULTURE  ? ? ?EKG ? ? ?Radiology ?No results found. ? ?Procedures ?Procedures (including critical care time) ? ?Medications Ordered in UC ?Medications - No data to display ? ?Initial Impression / Assessment and Plan / UC Course  ?I have reviewed the triage vital signs and the nursing notes. ? ?Pertinent labs & imaging results that were available during my care of the patient were reviewed by me and considered in my medical decision making (see chart for details). ? ?The patient is a 36 year old female who presents with urinary symptoms.  Symptoms have been present for almost 2 weeks.  She was treated with Bactrim without relief.  She has been afebrile, denies CVA tenderness.  Her vital signs are stable.  There is no concern for pyelonephritis or kidney stones at this time.  We will treat the patient with Cipro for 7 days.  We will also order a urine culture to ensure she is treated appropriately with the right antibiotic.  Patient encouraged to increase fluids, she should be drinking at least 64 ounces of water daily.  Voiding every 2 hours, and voiding 15 to 20 minutes after sexual intercourse.  Patient was advised to follow-up if symptoms worsen or do not improve  to include fever, chills, abdominal pain, low back pain, or other concerns. ?Final Clinical Impressions(s) / UC Diagnoses  ? ?Final diagnoses:  ?Acute urinary tract infection  ? ? ? ?Discharge Instructions

## 2022-02-19 NOTE — ED Triage Notes (Signed)
One week h/o urinary frequency, urgency and dysuria. Pt has been taking azo with some relief. Pt had an e-visit for sxs, tx with bactrim. Pt reports no decrease in sxs since starting meds. ?

## 2022-02-19 NOTE — Discharge Instructions (Addendum)
Take medication as prescribed. ?Your urine culture has been ordered to ensure you are being treated with the appropriate antibiotic.  If any changes need to be made, you will be contacted once your results are received. ?Increase fluids.  You should be drinking at least 64 ounces of water daily. ?You should be toileting every 2 hours. ?Void at least 15 to 20 minutes after any sexual intercourse. ?Follow-up if symptoms worsen or do not improve. ?

## 2022-02-19 NOTE — Patient Instructions (Signed)
?  Tammy Cole, thank you for joining Tammy Loveless, PA-C for today's virtual visit.  While this provider is not your primary care provider (PCP), if your PCP is located in our provider database this encounter information will be shared with them immediately following your visit. ? ?Consent: ?(Patient) Tammy Cole provided verbal consent for this virtual visit at the beginning of the encounter. ? ?Current Medications: ? ?Current Outpatient Medications:  ?  acetaminophen (TYLENOL) 500 MG tablet, Take 1,000 mg by mouth every 6 (six) hours as needed for mild pain., Disp: , Rfl:  ?  ibuprofen (ADVIL,MOTRIN) 600 MG tablet, Take 1 tablet (600 mg total) by mouth every 6 (six) hours. (Patient not taking: Reported on 05/18/2018), Disp: 30 tablet, Rfl: 0 ?  metroNIDAZOLE (FLAGYL) 500 MG tablet, Take 1 tablet (500 mg total) by mouth 2 (two) times daily., Disp: 14 tablet, Rfl: 0 ?  naproxen (NAPROSYN) 500 MG tablet, Take 1 tablet (500 mg total) by mouth 2 (two) times daily. (Patient not taking: Reported on 05/18/2018), Disp: 30 tablet, Rfl: 0 ?  sulfamethoxazole-trimethoprim (BACTRIM DS) 800-160 MG tablet, Take 1 tablet by mouth 2 (two) times daily., Disp: 10 tablet, Rfl: 0  ? ?Medications ordered in this encounter:  ?No orders of the defined types were placed in this encounter. ?  ? ?*If you need refills on other medications prior to your next appointment, please contact your pharmacy* ? ?Follow-Up: ?Call back or seek an in-person evaluation if the symptoms worsen or if the condition fails to improve as anticipated. ? ?Other Instructions ?Based on what you shared with me, I feel your condition warrants further evaluation and I recommend that you be seen in a face to face visit. ? ? For an urgent face to face visit, [facility] has six urgent care centers for your convenience:  ?  ? [facility] ?Get Driving Directions ?[phone] ?[phone] ?[place], [place]  [address] ?  ? [facility]) ?Get Driving Directions ?[phone] ?[address] ?[place], [place] [address] ? ?[facility]) ?Get Driving Directions ?[phone] ?[address] ?[place],  [place]  [address] ? ?[facility] ?Get Driving Directions ?[phone] ?[address], [address] ?[place], [place] [address] ?  ?[facility] [place] ?Get Driving Directions  ?[phone] ?[address] ?[address] ?[place], [place] [address] ?  ?[facility] ?Get Driving Directions ?[phone] ?[address] ?[place], [place] [address] ? ? ? ?If you have been instructed to have an in-person evaluation today at a local Urgent Care facility, please use the link below. It will take you to a list of all of our available [facility], including address, phone number and hours of operation. Please do not delay care.  ?[facility] ? ?If you or a family member do not have a primary care provider, use the link below to schedule a visit and establish care. When you choose a [facility] primary care physician or advanced practice provider, you gain a long-term partner in health. ?Find a Primary Care Provider ? ?Learn more about [facility]'s in-office and virtual care options: ?Mobile - Get Care Now ?

## 2022-02-19 NOTE — Progress Notes (Signed)
?Virtual Visit Consent  ? ?[name], you are scheduled for a virtual visit with a [facility] provider today.   ?  ?Just as with appointments in the office, your consent must be obtained to participate.  Your consent will be active for this visit and any virtual visit you may have with one of our providers in the next 365 days.   ?  ?If you have a MyChart account, a copy of this consent can be sent to you electronically.  All virtual visits are billed to your insurance company just like a traditional visit in the office.   ? ?As this is a virtual visit, video technology does not allow for your provider to perform a traditional examination.  This may limit your provider's ability to fully assess your condition.  If your provider identifies any concerns that need to be evaluated in person or the need to arrange testing (such as labs, EKG, etc.), we will make arrangements to do so.   ?  ?Although advances in technology are sophisticated, we cannot ensure that it will always work on either your end or our end.  If the connection with a video visit is poor, the visit may have to be switched to a telephone visit.  With either a video or telephone visit, we are not always able to ensure that we have a secure connection.    ? ?I need to obtain your verbal consent now.   Are you willing to proceed with your visit today?  ?  ?EARLEAN FIDALGO has provided verbal consent on 02/19/2022 for a virtual visit (video or telephone). ?  ?[name], PA-C  ? ?Date: 02/19/2022 8:12 AM ? ? ?Virtual Visit via Video Note  ? ?IMargaretann Loveless, connected with  Tammy Cole  ([id number], August 05, 1986) on 02/19/22 at  8:00 AM EDT by a video-enabled telemedicine application and verified that I am speaking with the correct person using two identifiers. ? ?Location: ?Patient: Virtual Visit Location Patient: Home ?Provider: Virtual Visit Location Provider: Home Office ?  ?I discussed the limitations of evaluation and management  by telemedicine and the availability of in person appointments. The patient expressed understanding and agreed to proceed.   ? ?History of Present Illness: ?Tammy Cole is a 36 y.o. who identifies as a female who was assigned female at birth, and is being seen today for UTI symptoms. Was seen virtually on 02/08/22. Started on Bactrim and completed treatment. Reports no improvement in symptoms. ? ? ?Problems:  ?Patient Active Problem List  ? Diagnosis Date Noted  ? Trichomoniasis 05/22/2018  ? Pap smear abnormality of cervix/human papillomavirus (HPV) positive 05/22/2018  ? Vitamin D deficiency 02/26/2013  ? History of depressed bipolar disorder (HCC) 01/09/2013  ?  ?Allergies: No Known Allergies ?Medications:  ?Current Outpatient Medications:  ?  acetaminophen (TYLENOL) 500 MG tablet, Take 1,000 mg by mouth every 6 (six) hours as needed for mild pain., Disp: , Rfl:  ?  ibuprofen (ADVIL,MOTRIN) 600 MG tablet, Take 1 tablet (600 mg total) by mouth every 6 (six) hours. (Patient not taking: Reported on 05/18/2018), Disp: 30 tablet, Rfl: 0 ?  metroNIDAZOLE (FLAGYL) 500 MG tablet, Take 1 tablet (500 mg total) by mouth 2 (two) times daily., Disp: 14 tablet, Rfl: 0 ?  naproxen (NAPROSYN) 500 MG tablet, Take 1 tablet (500 mg total) by mouth 2 (two) times daily. (Patient not taking: Reported on 05/18/2018), Disp: 30 tablet, Rfl: 0 ?  sulfamethoxazole-trimethoprim (BACTRIM DS)  800-160 MG tablet, Take 1 tablet by mouth 2 (two) times daily., Disp: 10 tablet, Rfl: 0 ? ?Observations/Objective: ?Patient is well-developed, well-nourished in no acute distress.  ?Resting comfortably at home.  ?Head is normocephalic, atraumatic.  ?No labored breathing.  ?Speech is clear and coherent with logical content.  ?Patient is alert and oriented at baseline.  ? ? ?Assessment and Plan: ?1. Recurrent UTI ? ?- Since failed treatment x 1 will refer to in person evaluation at a local UC for urine culture to be obtained, as well as any other testing  deemed necessary.  ? ?Follow Up Instructions: ?I discussed the assessment and treatment plan with the patient. The patient was provided an opportunity to ask questions and all were answered. The patient agreed with the plan and demonstrated an understanding of the instructions.  A copy of instructions were sent to the patient via MyChart unless otherwise noted below.  ? ? ?The patient was advised to call back or seek an in-person evaluation if the symptoms worsen or if the condition fails to improve as anticipated. ? ?Time:  ?I spent 8 minutes with the patient via telehealth technology discussing the above problems/concerns.   ? ?[name], PA-C ?

## 2022-02-21 LAB — URINE CULTURE: Culture: >=100000 — AB

## 2022-03-23 ENCOUNTER — Encounter: Payer: Self-pay | Admitting: Nurse Practitioner

## 2022-04-20 ENCOUNTER — Emergency Department (HOSPITAL_COMMUNITY): Payer: Medicaid Other

## 2022-04-20 ENCOUNTER — Other Ambulatory Visit: Payer: Self-pay

## 2022-04-20 ENCOUNTER — Emergency Department (HOSPITAL_COMMUNITY)
Admission: EM | Admit: 2022-04-20 | Discharge: 2022-04-20 | Disposition: A | Discharge status: Home / Self Care | Payer: Medicaid Other | Attending: Emergency Medicine | Admitting: Emergency Medicine

## 2022-04-20 DIAGNOSIS — R739 Hyperglycemia, unspecified: Secondary | ICD-10-CM | POA: Diagnosis not present

## 2022-04-20 DIAGNOSIS — D649 Anemia, unspecified: Secondary | ICD-10-CM | POA: Insufficient documentation

## 2022-04-20 DIAGNOSIS — N132 Hydronephrosis with renal and ureteral calculous obstruction: Secondary | ICD-10-CM | POA: Insufficient documentation

## 2022-04-20 DIAGNOSIS — R109 Unspecified abdominal pain: Secondary | ICD-10-CM | POA: Diagnosis present

## 2022-04-20 DIAGNOSIS — N200 Calculus of kidney: Secondary | ICD-10-CM

## 2022-04-20 DIAGNOSIS — R61 Generalized hyperhidrosis: Secondary | ICD-10-CM | POA: Insufficient documentation

## 2022-04-20 LAB — CBC
HCT: 31.1 % — ABNORMAL LOW (ref 36.0–46.0)
Hemoglobin: 9.5 g/dL — ABNORMAL LOW (ref 12.0–15.0)
MCH: 26 pg (ref 26.0–34.0)
MCHC: 30.5 g/dL (ref 30.0–36.0)
MCV: 85 fL (ref 80.0–100.0)
Platelets: 279 10*3/uL (ref 150–400)
RBC: 3.66 MIL/uL — ABNORMAL LOW (ref 3.87–5.11)
RDW: 17.4 % — ABNORMAL HIGH (ref 11.5–15.5)
WBC: 10.3 10*3/uL (ref 4.0–10.5)
nRBC: 0 % (ref 0.0–0.2)

## 2022-04-20 LAB — COMPREHENSIVE METABOLIC PANEL
ALT: 13 U/L (ref 0–44)
AST: 20 U/L (ref 15–41)
Albumin: 3.6 g/dL (ref 3.5–5.0)
Alkaline Phosphatase: 46 U/L (ref 38–126)
Anion gap: 8 (ref 5–15)
BUN: 13 mg/dL (ref 6–20)
CO2: 21 mmol/L — ABNORMAL LOW (ref 22–32)
Calcium: 9.2 mg/dL (ref 8.9–10.3)
Chloride: 107 mmol/L (ref 98–111)
Creatinine, Ser: 0.99 mg/dL (ref 0.44–1.00)
GFR, Estimated: >60 mL/min (ref >60)
Glucose, Bld: 115 mg/dL — ABNORMAL HIGH (ref 70–99)
Potassium: 3.8 mmol/L (ref 3.5–5.1)
Sodium: 136 mmol/L (ref 135–145)
Total Bilirubin: 0.1 mg/dL — ABNORMAL LOW (ref 0.3–1.2)
Total Protein: 6.8 g/dL (ref 6.5–8.1)

## 2022-04-20 LAB — URINALYSIS, ROUTINE W REFLEX MICROSCOPIC
Bilirubin Urine: NEGATIVE
Glucose, UA: NEGATIVE mg/dL
Hgb urine dipstick: NEGATIVE
Ketones, ur: NEGATIVE mg/dL
Nitrite: NEGATIVE
Protein, ur: NEGATIVE mg/dL
Specific Gravity, Urine: >1.046 — ABNORMAL HIGH (ref 1.005–1.030)
pH: 8 (ref 5.0–8.0)

## 2022-04-20 LAB — LIPASE, BLOOD: Lipase: 36 U/L (ref 11–51)

## 2022-04-20 LAB — I-STAT BETA HCG BLOOD, ED (MC, WL, AP ONLY): I-stat hCG, quantitative: <5 m[IU]/mL (ref <5)

## 2022-04-20 MED ORDER — IOHEXOL 300 MG/ML  SOLN
80.0000 mL | Freq: Once | INTRAMUSCULAR | Status: AC | PRN
  Administered 2022-04-20: 80 mL via INTRAVENOUS

## 2022-04-20 MED ORDER — HYDROMORPHONE HCL 1 MG/ML IJ SOLN
1.0000 mg | Freq: Once | INTRAMUSCULAR | Status: AC
  Administered 2022-04-20: 1 mg via INTRAVENOUS
  Filled 2022-04-20: qty 1

## 2022-04-20 MED ORDER — ONDANSETRON HCL 4 MG PO TABS: 4.0000 mg | ORAL_TABLET | Freq: Three times a day (TID) | ORAL | 0 refills | Status: AC | PRN

## 2022-04-20 MED ORDER — MAGNESIUM SULFATE 2 GM/50ML IV SOLN
2.0000 g | Freq: Once | INTRAVENOUS | Status: AC
  Administered 2022-04-20: 2 g via INTRAVENOUS
  Filled 2022-04-20: qty 50

## 2022-04-20 MED ORDER — ONDANSETRON HCL 4 MG/2ML IJ SOLN
4.0000 mg | Freq: Once | INTRAMUSCULAR | Status: AC
  Administered 2022-04-20: 4 mg via INTRAVENOUS
  Filled 2022-04-20: qty 2

## 2022-04-20 MED ORDER — HYDROCODONE-ACETAMINOPHEN 5-325 MG PO TABS: 1.0000 | ORAL_TABLET | Freq: Four times a day (QID) | ORAL | 0 refills | Status: AC | PRN

## 2022-04-20 MED ORDER — NALOXONE HCL 0.4 MG/ML IJ SOLN: 0.2000 mg | Freq: Once | INTRAMUSCULAR | Status: DC

## 2022-04-20 MED ORDER — FENTANYL CITRATE PF 50 MCG/ML IJ SOSY
100.0000 ug | PREFILLED_SYRINGE | Freq: Once | INTRAMUSCULAR | Status: AC
  Administered 2022-04-20: 100 ug via INTRAVENOUS
  Filled 2022-04-20: qty 2

## 2022-04-20 MED ORDER — MORPHINE SULFATE (PF) 4 MG/ML IV SOLN: 4.0000 mg | Freq: Once | INTRAVENOUS | Status: DC

## 2022-04-20 MED ORDER — KETOROLAC TROMETHAMINE 15 MG/ML IJ SOLN
15.0000 mg | Freq: Once | INTRAMUSCULAR | Status: AC
  Administered 2022-04-20: 15 mg via INTRAVENOUS
  Filled 2022-04-20: qty 1

## 2022-04-20 MED ORDER — TAMSULOSIN HCL 0.4 MG PO CAPS: 0.4000 mg | ORAL_CAPSULE | Freq: Every day | ORAL | 0 refills | Status: AC

## 2022-04-20 MED ORDER — SODIUM CHLORIDE 0.9 % IV BOLUS
1000.0000 mL | Freq: Once | INTRAVENOUS | Status: AC
  Administered 2022-04-20: 1000 mL via INTRAVENOUS

## 2022-04-20 NOTE — ED Triage Notes (Signed)
Pt. Stated, sudden onset of left side pain with N/V

## 2022-04-20 NOTE — ED Provider Notes (Signed)
[facility] EMERGENCY DEPARTMENT Provider Note   CSN: [id number] Arrival date & time: 04/20/22  0708     History  Chief Complaint  Patient presents with   Abdominal Pain   Emesis   Nausea    Tammy Cole is a 36 y.o. female with medical history significant for anemia, anxiety, bipolar disorder 1, fibroids, recurrent UTIs.  Patient presents to ED for evaluation of abdominal pain.  Patient reports that this morning around 5 to 5:30 AM she woke up to use the restroom.  The patient states that she urinated, laid back down to go to bed and then 10 minutes later had sudden onset left-sided abdominal and left flank pain.  Patient reports that the pain is "stabbing and sharp" in nature.  Patient reports the pain is constant.  Patient states that she has history of fibroids however she has never had pain like this.  Patient denies history of kidney stones.  Patient states that she has a "tension" in her left flank/back.  Patient endorsing abdominal pain, flank pain, nausea, vomiting.  Patient denies fevers, blood in stool, blood in vomit, dysuria.   Abdominal Pain Associated symptoms: nausea and vomiting   Associated symptoms: no chills, no dysuria, no fever and no hematuria   Emesis Associated symptoms: abdominal pain   Associated symptoms: no chills and no fever       Home Medications Prior to Admission medications   Medication Sig Start Date End Date Taking? Authorizing Provider  HYDROcodone-acetaminophen (NORCO/VICODIN) 5-325 MG tablet Take 1 tablet by mouth every 6 (six) hours as needed for severe pain. 04/20/22  Yes Azucena Cecil, PA-C  ondansetron (ZOFRAN) 4 MG tablet Take 1 tablet (4 mg total) by mouth every 8 (eight) hours as needed for nausea or vomiting. 04/20/22  Yes Azucena Cecil, PA-C  tamsulosin (FLOMAX) 0.4 MG CAPS capsule Take 1 capsule (0.4 mg total) by mouth daily after breakfast. 04/20/22  Yes Azucena Cecil, PA-C  acetaminophen  (TYLENOL) 500 MG tablet Take 1,000 mg by mouth every 6 (six) hours as needed for mild pain.    [provider]  ibuprofen (ADVIL,MOTRIN) 600 MG tablet Take 1 tablet (600 mg total) by mouth every 6 (six) hours. Patient not taking: Reported on 05/18/2018 03/05/15   Truett Mainland, DO  metroNIDAZOLE (FLAGYL) 500 MG tablet Take 1 tablet (500 mg total) by mouth 2 (two) times daily. 05/22/18   Aletha Halim, MD  naproxen (NAPROSYN) 500 MG tablet Take 1 tablet (500 mg total) by mouth 2 (two) times daily. Patient not taking: Reported on 05/18/2018 02/02/17   Nona Dell, PA-C  sulfamethoxazole-trimethoprim (BACTRIM DS) 800-160 MG tablet Take 1 tablet by mouth 2 (two) times daily. 02/08/22   Mar Daring, PA-C      Allergies    Patient has no known allergies.    Review of Systems   Review of Systems  Constitutional:  Negative for chills and fever.  Gastrointestinal:  Positive for abdominal pain, nausea and vomiting.  Genitourinary:  Positive for flank pain. Negative for dysuria and hematuria.  All other systems reviewed and are negative.  Physical Exam Updated Vital Signs BP 121/86   Pulse 62   Temp 97.8 F (36.6 C) (Oral)   Resp 17   LMP 04/05/2022   SpO2 100%  Physical Exam Vitals and nursing note reviewed.  Constitutional:      General: She is in acute distress.     Appearance: She is diaphoretic.  She is not ill-appearing or toxic-appearing.     Comments: Patient writhing in bed, tearful  HENT:     Head: Normocephalic and atraumatic.     Nose: Nose normal.     Mouth/Throat:     Mouth: Mucous membranes are moist.     Pharynx: Oropharynx is clear.  Eyes:     Extraocular Movements: Extraocular movements intact.     Pupils: Pupils are equal, round, and reactive to light.  Cardiovascular:     Rate and Rhythm: Normal rate and regular rhythm.  Pulmonary:     Effort: Pulmonary effort is normal. No respiratory distress.     Breath sounds: Normal breath  sounds. No wheezing.  Abdominal:     General: Abdomen is flat. Bowel sounds are normal. There is no distension.     Palpations: Abdomen is soft.     Tenderness: There is abdominal tenderness in the left lower quadrant. There is left CVA tenderness. There is no right CVA tenderness.  Musculoskeletal:     Cervical back: Normal range of motion and neck supple. No rigidity or tenderness.  Skin:    General: Skin is warm.     Capillary Refill: Capillary refill takes less than 2 seconds.  Neurological:     Mental Status: She is alert and oriented to person, place, and time.    ED Results / Procedures / Treatments   Labs (all labs ordered are listed, but only abnormal results are displayed) Labs Reviewed  COMPREHENSIVE METABOLIC PANEL - Abnormal; Notable for the following components:      Result Value   CO2 21 (*)    Glucose, Bld 115 (*)    Total Bilirubin 0.1 (*)    All other components within normal limits  CBC - Abnormal; Notable for the following components:   RBC 3.66 (*)    Hemoglobin 9.5 (*)    HCT 31.1 (*)    RDW 17.4 (*)    All other components within normal limits  LIPASE, BLOOD  URINALYSIS, ROUTINE W REFLEX MICROSCOPIC  DIFFERENTIAL  I-STAT BETA HCG BLOOD, ED ([facility], [facility], [facility] ONLY)    EKG None  Radiology CT ABDOMEN PELVIS W CONTRAST  Result Date: 04/20/2022 CLINICAL DATA:  Left flank pain EXAM: CT ABDOMEN AND PELVIS WITH CONTRAST TECHNIQUE: Multidetector CT imaging of the abdomen and pelvis was performed using the standard protocol following bolus administration of intravenous contrast. RADIATION DOSE REDUCTION: This exam was performed according to the departmental dose-optimization program which includes automated exposure control, adjustment of the mA and/or kV according to patient size and/or use of iterative reconstruction technique. CONTRAST:  80mL OMNIPAQUE IOHEXOL 300 MG/ML  SOLN COMPARISON:  None Available. FINDINGS: Lower chest: No acute abnormality. Hepatobiliary: No  focal liver abnormality is seen. No gallstones, gallbladder wall thickening, or biliary dilatation. Pancreas: Unremarkable. No pancreatic ductal dilatation or surrounding inflammatory changes. Spleen: Normal in size without focal abnormality. Adrenals/Urinary Tract: Unremarkable adrenal glands. Left kidney appears slightly enlarged and edematous with delayed left renal nephrogram. Obstructing 2 mm stone at the left ureterovesical junction resulting in mild-to-moderate left hydronephrosis. No right-sided renal or ureteral calculi. No right-sided hydronephrosis. Urinary bladder within normal limits for the degree of distension. Stomach/Bowel: Stomach is within normal limits. Appendix appears normal. No evidence of bowel wall thickening, distention, or inflammatory changes. Vascular/Lymphatic: No significant vascular findings are present. No enlarged abdominal or pelvic lymph nodes. Reproductive: Anteverted uterus. A area of relatively decreased density within the posterior uterine body which may represent a underlying  fibroid. 2 tubal ligation clips in the region of the left adnexa suggesting a migrating clip. No adnexal masses. Other: No free fluid. No abdominopelvic fluid collection. No pneumoperitoneum. No abdominal wall hernia. Musculoskeletal: No acute or significant osseous findings. IMPRESSION: 1. Obstructing 2 mm stone at the left ureterovesical junction resulting in mild-to-moderate left hydronephrosis. 2. A area of relatively decreased density within the posterior uterine body which may represent an underlying fibroid. 3. Suspected migration of a right tubal ligation clip into the left hemipelvis. Electronically Signed   By: Davina Poke D.O.   On: 04/20/2022 08:40    Procedures Procedures    Medications Ordered in ED Medications  ondansetron (ZOFRAN) injection 4 mg (4 mg Intravenous Given 04/20/22 0759)  sodium chloride 0.9 % bolus 1,000 mL (0 mLs Intravenous Stopped 04/20/22 1002)  ketorolac  (TORADOL) 15 MG/ML injection 15 mg (15 mg Intravenous Given 04/20/22 0801)  fentaNYL (SUBLIMAZE) injection 100 mcg (100 mcg Intravenous Given 04/20/22 0758)  iohexol (OMNIPAQUE) 300 MG/ML solution 80 mL (80 mLs Intravenous Contrast Given 04/20/22 0828)  magnesium sulfate IVPB 2 g 50 mL (0 g Intravenous Stopped 04/20/22 0943)  HYDROmorphone (DILAUDID) injection 1 mg (1 mg Intravenous Given 04/20/22 [id number])    ED Course/ Medical Decision Making/ A&P Clinical Course as of 04/20/22 1008  Tue Apr 20, 2022  0743 This is a 36 year old female w/ hx of UTI's presenting with acute onset of left flank/LUQ abdominal pain, woke her up from sleep this morning, never had this before, sharp stabbing pain.  Patient is sobbing on exam, writhing in stretcher, difficult to perform abdominal exam due to her degree of distress.  Ddx includes ureteral stone vs UTI vs other intraabdominal source of pain.  Location of symptoms seems less consistent with ovarian torsion.  Pending IV pain medications, labs, UA, CT abdomen.  Here with boyfriend or partner at bedside.   [MT]    Clinical Course User Index [MT] Trifan, Carola Rhine, MD                           Medical Decision Making Amount and/or Complexity of Data Reviewed Labs: ordered. Radiology: ordered.  Risk Prescription drug management.   36 year old female presents to the ED for evaluation of abdominal/flank pain.  Please see HPI for further details.  On examination, the patient is writhing in bed crying.  The patient is afebrile, nontachycardic.  The patient lung sounds are clear bilaterally and she is not hypoxic on room air.  Patient's abdomen is soft and compressible however the patient does endorse tenderness to her left lower quadrant as well as with left-sided CVA tenderness.  Patient follows commands appropriately.  Patient worked up utilizing the following labs and imaging studies interpreted by me personally: - CMP shows elevated glucose 115 however  noncontributory patient symptoms - CBC shows decreased hemoglobin of 9.5 however this is consistent with the patient's baseline, patient has underlying anemia.  Patient white blood cell count not elevated to indicate infection. - Urinalysis pending - CT abdomen pelvis shows 2 mm obstructing stone at left UVJ with mild to moderate hydronephrosis.  Patient treated with 1 mg Dilaudid, 15 mg Toradol, 100 mcg fentanyl, 4 mg Zofran.  Patient provided with 2 g magnesium bolus to encourage smooth muscle relaxation.  Patient feels much better after these medications were given.  At this time, the patient will be referred to urology and discharged with narcotic pain medication, expulsive therapy, antinausea  medication.  Patient will be advised to follow-up with urologist.  Patient given return precautions to include inability to urinate, fevers, body aches or chills.  Patient voices understanding.  Patient had all of her questions answered to her satisfaction prior to discharge.  The patient is stable this time for discharge.   Final Clinical Impression(s) / ED Diagnoses Final diagnoses:  Nephrolithiasis    Rx / DC Orders ED Discharge Orders          Ordered    HYDROcodone-acetaminophen (NORCO/VICODIN) 5-325 MG tablet  Every 6 hours PRN        04/20/22 1007    ondansetron (ZOFRAN) 4 MG tablet  Every 8 hours PRN        04/20/22 1007    tamsulosin (FLOMAX) 0.4 MG CAPS capsule  Daily after breakfast        04/20/22 1007              Azucena Cecil, [place] 04/20/22 1008    Wyvonnia Dusky, MD 04/20/22 1314

## 2022-04-20 NOTE — ED Notes (Signed)
Pt verbalizes understanding of discharge instructions. Opportunity for questions and answers were provided. Pt discharged from the ED.   ?

## 2022-04-20 NOTE — Discharge Instructions (Signed)
Please return to the ED with any new symptoms such as fevers, inability to urinate, body aches or chills Please follow-up with the urologist that I referred you to. Please pick up medications have sent in for you.  You will take the tamsulosin to allow the stone to pass, you will take the Zofran for nausea, you will take the hydrocodone for pain.  Please do not drive or operate heavy machinery while under the influence of pain medication.  Pain medication might make you constipated so I suggest picking up MiraLAX or Metamucil fiber in order to prevent this. Please continue to stay hydrated and urinate frequently Please read attached informational guides concerning kidney stones as well as dietary guidelines to help prevent kidney stones

## 2022-05-20 ENCOUNTER — Encounter: Payer: Medicaid Other | Admitting: Obstetrics and Gynecology

## 2022-05-28 ENCOUNTER — Encounter: Payer: Medicaid Other | Admitting: Obstetrics

## 2022-06-23 ENCOUNTER — Encounter: Payer: Self-pay | Admitting: Radiology

## 2022-10-30 ENCOUNTER — Telehealth: Payer: Medicaid Other | Admitting: Physician Assistant

## 2022-10-30 DIAGNOSIS — N760 Acute vaginitis: Secondary | ICD-10-CM | POA: Diagnosis not present

## 2022-10-30 MED ORDER — FLUCONAZOLE 150 MG PO TABS: 150.0000 mg | ORAL_TABLET | Freq: Once | ORAL | 0 refills | Status: AC

## 2022-10-30 NOTE — Progress Notes (Signed)
Virtual Visit Consent   Tammy Cole, you are scheduled for a virtual visit with a [facility] provider today. Just as with appointments in the office, your consent must be obtained to participate. Your consent will be active for this visit and any virtual visit you may have with one of our providers in the next 365 days. If you have a MyChart account, a copy of this consent can be sent to you electronically.  As this is a virtual visit, video technology does not allow for your provider to perform a traditional examination. This may limit your provider's ability to fully assess your condition. If your provider identifies any concerns that need to be evaluated in person or the need to arrange testing (such as labs, EKG, etc.), we will make arrangements to do so. Although advances in technology are sophisticated, we cannot ensure that it will always work on either your end or our end. If the connection with a video visit is poor, the visit may have to be switched to a telephone visit. With either a video or telephone visit, we are not always able to ensure that we have a secure connection.  By engaging in this virtual visit, you consent to the provision of healthcare and authorize for your insurance to be billed (if applicable) for the services provided during this visit. Depending on your insurance coverage, you may receive a charge related to this service.  I need to obtain your verbal consent now. Are you willing to proceed with your visit today? Tammy Cole has provided verbal consent on 10/30/2022 for a virtual visit (video or telephone). Jarold Motto, [place]  Date: 10/30/2022 12:02 PM  Virtual Visit via Video Note   I, Jarold Motto, connected with  Tammy Cole  ([id number], [birth year]) on 10/30/22 at 11:30 AM EST by a video-enabled telemedicine application and verified that I am speaking with the correct person using two identifiers.  Location: Patient: Virtual Visit Location Patient:  Home Provider: Virtual Visit Location Provider: Home Office   I discussed the limitations of evaluation and management by telemedicine and the availability of in person appointments. The patient expressed understanding and agreed to proceed.    History of Present Illness: Tammy Cole is a 36 y.o. who identifies as a female who was assigned female at birth, and is being seen today for yeast infection.  Sx started yesterday Used new body wash Vulvar itching and vaginal white discharge today, thick Has not tried anything for her symptoms Denies concerns for STI or pregnancy, fever, chills, n/v/d, pelvic/abd pain  HPI: HPI  Problems:  Patient Active Problem List   Diagnosis Date Noted   Trichomoniasis 05/22/2018   Pap smear abnormality of cervix/human papillomavirus (HPV) positive 05/22/2018   Vitamin D deficiency 02/26/2013   History of depressed bipolar disorder (HCC) 01/09/2013    Allergies: No Known Allergies Medications:  Current Outpatient Medications:    fluconazole (DIFLUCAN) 150 MG tablet, Take 1 tablet (150 mg total) by mouth once for 1 dose., Disp: 1 tablet, Rfl: 0   acetaminophen (TYLENOL) 500 MG tablet, Take 1,000 mg by mouth every 6 (six) hours as needed for mild pain., Disp: , Rfl:    HYDROcodone-acetaminophen (NORCO/VICODIN) 5-325 MG tablet, Take 1 tablet by mouth every 6 (six) hours as needed for severe pain., Disp: 10 tablet, Rfl: 0   ibuprofen (ADVIL,MOTRIN) 600 MG tablet, Take 1 tablet (600 mg total) by mouth every 6 (six) hours. (Patient not taking: Reported on 05/18/2018), Disp:  30 tablet, Rfl: 0   metroNIDAZOLE (FLAGYL) 500 MG tablet, Take 1 tablet (500 mg total) by mouth 2 (two) times daily., Disp: 14 tablet, Rfl: 0   naproxen (NAPROSYN) 500 MG tablet, Take 1 tablet (500 mg total) by mouth 2 (two) times daily. (Patient not taking: Reported on 05/18/2018), Disp: 30 tablet, Rfl: 0   ondansetron (ZOFRAN) 4 MG tablet, Take 1 tablet (4 mg total) by mouth every 8  (eight) hours as needed for nausea or vomiting., Disp: 15 tablet, Rfl: 0   sulfamethoxazole-trimethoprim (BACTRIM DS) 800-160 MG tablet, Take 1 tablet by mouth 2 (two) times daily., Disp: 10 tablet, Rfl: 0   tamsulosin (FLOMAX) 0.4 MG CAPS capsule, Take 1 capsule (0.4 mg total) by mouth daily after breakfast., Disp: 30 capsule, Rfl: 0  Observations/Objective: Patient is well-developed, well-nourished in no acute distress.  Resting comfortably  at home.  Head is normocephalic, atraumatic.  No labored breathing.  Speech is clear and coherent with logical content.  Patient is alert and oriented at baseline.   Assessment and Plan: 1. Acute vaginitis No red flags Will treat for suspected yeast infection with 150 mg diflucan tablet Follow-up with PCP or ob-gyn for further eval if persists/worsen  Follow Up Instructions: I discussed the assessment and treatment plan with the patient. The patient was provided an opportunity to ask questions and all were answered. The patient agreed with the plan and demonstrated an understanding of the instructions.  A copy of instructions were sent to the patient via MyChart unless otherwise noted below.   The patient was advised to call back or seek an in-person evaluation if the symptoms worsen or if the condition fails to improve as anticipated.  Time:  I spent 5-10 minutes with the patient via telehealth technology discussing the above problems/concerns.    Jarold Motto, [place]

## 2022-11-04 ENCOUNTER — Encounter: Payer: Self-pay | Admitting: *Deleted

## 2023-08-01 ENCOUNTER — Ambulatory Visit (HOSPITAL_BASED_OUTPATIENT_CLINIC_OR_DEPARTMENT_OTHER): Payer: Medicaid Other | Admitting: Family Medicine

## 2023-08-04 ENCOUNTER — Ambulatory Visit (HOSPITAL_BASED_OUTPATIENT_CLINIC_OR_DEPARTMENT_OTHER): Payer: Medicaid Other | Admitting: Family Medicine

## 2023-09-05 ENCOUNTER — Encounter (HOSPITAL_BASED_OUTPATIENT_CLINIC_OR_DEPARTMENT_OTHER): Payer: Self-pay | Admitting: Family Medicine

## 2023-10-13 ENCOUNTER — Encounter (HOSPITAL_BASED_OUTPATIENT_CLINIC_OR_DEPARTMENT_OTHER): Payer: Self-pay | Admitting: Family Medicine

## 2023-11-09 ENCOUNTER — Encounter: Payer: Self-pay | Admitting: Physician Assistant

## 2023-11-09 ENCOUNTER — Ambulatory Visit: Payer: 59 | Admitting: Physician Assistant

## 2023-11-09 ENCOUNTER — Other Ambulatory Visit (HOSPITAL_COMMUNITY)
Admission: RE | Admit: 2023-11-09 | Discharge: 2023-11-09 | Disposition: A | Discharge status: Home / Self Care | Payer: 59 | Source: Ambulatory Visit | Attending: Physician Assistant | Admitting: Physician Assistant

## 2023-11-09 VITALS — BP 101/76 | HR 62 | Ht 64.0 in | Wt 136.0 lb

## 2023-11-09 DIAGNOSIS — A599 Trichomoniasis, unspecified: Secondary | ICD-10-CM

## 2023-11-09 DIAGNOSIS — E559 Vitamin D deficiency, unspecified: Secondary | ICD-10-CM | POA: Diagnosis not present

## 2023-11-09 DIAGNOSIS — R87618 Other abnormal cytological findings on specimens from cervix uteri: Secondary | ICD-10-CM

## 2023-11-09 DIAGNOSIS — Z124 Encounter for screening for malignant neoplasm of cervix: Secondary | ICD-10-CM

## 2023-11-09 DIAGNOSIS — D509 Iron deficiency anemia, unspecified: Secondary | ICD-10-CM

## 2023-11-09 DIAGNOSIS — A5901 Trichomonal vulvovaginitis: Secondary | ICD-10-CM | POA: Diagnosis not present

## 2023-11-09 DIAGNOSIS — N760 Acute vaginitis: Secondary | ICD-10-CM | POA: Insufficient documentation

## 2023-11-09 DIAGNOSIS — N926 Irregular menstruation, unspecified: Secondary | ICD-10-CM | POA: Diagnosis not present

## 2023-11-09 DIAGNOSIS — N920 Excessive and frequent menstruation with regular cycle: Secondary | ICD-10-CM | POA: Diagnosis not present

## 2023-11-09 DIAGNOSIS — D649 Anemia, unspecified: Secondary | ICD-10-CM

## 2023-11-09 DIAGNOSIS — B9689 Other specified bacterial agents as the cause of diseases classified elsewhere: Secondary | ICD-10-CM

## 2023-11-09 DIAGNOSIS — Z113 Encounter for screening for infections with a predominantly sexual mode of transmission: Secondary | ICD-10-CM | POA: Diagnosis not present

## 2023-11-09 NOTE — Patient Instructions (Addendum)
I started a referral for you to be seen by gynecology for further evaluation of your heavy menstrual cycle.  We will call you with today's lab results.  Roney Jaffe, PA-C Physician Assistant [facility] [email or web address]   Menorrhagia Menorrhagia is a form of abnormal uterine bleeding in which menstrual periods are heavy or last longer than normal. With menorrhagia, the periods may cause enough blood loss and cramping that a woman becomes unable to take part in her usual activities. What are the causes? Common causes of this condition include: Polyps or fibroids. These are noncancerous growths in the uterus. An imbalance of the hormones estrogen and progesterone. Anovulation, which occurs when one of the ovaries does not release an egg during one or more months. A problem with the thyroid gland (hypothyroidism). Side effects of having an intrauterine device (IUD). Side effects of some medicines, such as NSAIDs or blood thinners. A bleeding disorder that stops the blood from clotting normally. In some cases, the cause of this condition is not known. What increases the risk? You are more likely to develop this condition if you have cancer of the uterus. What are the signs or symptoms? Symptoms of this condition include: Routinely having to change your pad or tampon every 1-2 hours because it is soaked. Needing to use pads and tampons at the same time because of heavy bleeding. Needing to wake up to change your pads or tampons during the night. Passing blood clots larger than 1 inch (2.5 cm) in size. Having bleeding that lasts for more than 7 days. Having symptoms of low iron levels (anemia), such as tiredness (fatigue) or shortness of breath. How is this diagnosed? This condition may be diagnosed based on: A physical exam. Your symptoms and menstrual history. Tests, such as: Blood tests to check if you are pregnant or if  you have hormonal changes, a bleeding or thyroid disorder, anemia, or other problems. Pap test to check for cancerous changes, infections, or inflammation. Endometrial biopsy. This test involves removing a tissue sample from the lining of the uterus (endometrium) to be examined under a microscope. Pelvic ultrasound. This test uses sound waves to create images of your uterus, ovaries, and vagina. The images can show if you have fibroids or other growths. Hysteroscopy. For this test, a thin, flexible tube with a light on the end (hysteroscope) is used to look inside your uterus. How is this treated? Treatment may not be needed for this condition. If it is needed, the best treatment for you will depend on: Whether you need to prevent pregnancy. Your desire to have children in the future. The cause and severity of your bleeding. Your personal preference. Medicine Medicines are the first step in treatment. You may be treated with: Hormonal birth control methods. These treatments reduce bleeding during your menstrual period. They include: Birth control pills. Skin patch. Vaginal ring. Shots (injections) that you get every 3 months. Hormonal IUD. Implants that go under the skin. Medicines that thicken the blood and slow bleeding. Medicines that reduce swelling, such as ibuprofen. Medicines that contain an artificial (synthetic) hormone called progestin. Medicines that make the ovaries stop working for a short time. Iron supplements to treat anemia.  Surgery If medicines do not work, surgery may be done. Surgical options may include: Dilation and curettage (D&C). In this procedure, your health care provider opens the lowest part of the uterus (cervix) and then scrapes or suctions tissue from the endometrium. This reduces menstrual bleeding. Operative hysteroscopy. In  this procedure, a hysteroscope is used to view your uterus and help remove polyps that may be causing heavy periods. Endometrial  ablation. This is when various techniques are used to permanently destroy your entire endometrium. After endometrial ablation, most women have little or no menstrual flow. This procedure reduces your ability to become pregnant. Endometrial resection. In this procedure, an electrosurgical wire loop is used to remove the endometrium. This procedure reduces your ability to become pregnant. Hysterectomy. This is surgical removal of your uterus. This is a permanent procedure that stops menstrual periods. Pregnancy is not possible after a hysterectomy. Follow these instructions at home: Medicines Take over-the-counter and prescription medicines only as told by your health care provider. This includes iron pills. Do not change or switch medicines without asking your health care provider. Do not take aspirin or medicines that contain aspirin 1 week before or during your menstrual period. Aspirin may make bleeding worse. Managing constipation Your iron pills may cause constipation. If you are taking prescription iron supplements, you may need to take these actions to prevent or treat constipation: Drink enough fluid to keep your urine pale yellow. Take over-the-counter or prescription medicines. Eat foods that are high in fiber, such as beans, whole grains, and fresh fruits and vegetables. Limit foods that are high in fat and processed sugars, such as fried or sweet foods. General instructions If you need to change your sanitary pad or tampon more than once every 2 hours, limit your activity until the bleeding stops. Eat well-balanced meals, including foods that are high in iron. Foods that have a lot of iron include leafy green vegetables, meat, liver, eggs, and whole-grain breads and cereals. Do not try to lose weight until the abnormal bleeding has stopped and your blood iron level is back to normal. If you need to lose weight, work with your health care provider to lose weight safely. Keep all follow-up  visits. This is important. Contact a health care provider if: You soak through a pad or tampon every 1 or 2 hours, and this happens every time you have a period. You need to use pads and tampons at the same time because you are bleeding so much. You have nausea, vomiting, diarrhea, or other problems related to medicines you are taking. Get help right away if: You soak through more than a pad or tampon in 1 hour. You pass clots bigger than 1 inch (2.5 cm) wide. You feel short of breath. You feel like your heart is beating too fast. You feel dizzy or you faint. You feel very weak or tired. Summary Menorrhagia is a form of abnormal uterine bleeding in which menstrual periods are heavy or last longer than normal. Treatment may not be needed for this condition. If it is needed, it may include medicines or procedures. Take over-the-counter and prescription medicines only as told by your health care provider. This includes iron pills. Get help right away if you have heavy bleeding that soaks through more than a pad or tampon in 1 hour, you pass large clots, or you feel dizzy, short of breath, or very weak or tired. This information is not intended to replace advice given to you by your health care provider. Make sure you discuss any questions you have with your health care provider. Document Revised: 07/22/2020 Document Reviewed: 07/22/2020 Elsevier Patient Education  2024 [employer].

## 2023-11-09 NOTE — Progress Notes (Signed)
New Patient Office Visit  Subjective    Patient ID: Tammy Cole, female    DOB: March 13, 1986  Age: 37 y.o. MRN: [id number]  CC:  Chief Complaint  Patient presents with   Gynecologic Exam    HPI [name] states that she has been experiencing heavy menses with painful cramping for the past 2 years.  States that her menses are mostly on a normal schedule, will have pain 3 for the 3 days prior to her menses.  States that she will use over-the-counter pain relievers without much relief.  Does endorse history of abnormal Pap.  Abnormal CT in May 2023  IMPRESSION: 1. Obstructing 2 mm stone at the left ureterovesical junction resulting in mild-to-moderate left hydronephrosis. 2. A area of relatively decreased density within the posterior uterine body which may represent an underlying fibroid. 3. Suspected migration of a right tubal ligation clip into the left hemipelvis.     Electronically Signed   By: Duanne Guess D.O.   On: 04/20/2022 08:40  Endorses history of anemia and vitamin D deficiency  Request screening for STIs, no known exposures, no known symptoms   Outpatient Encounter Medications as of 11/09/2023  Medication Sig   acetaminophen (TYLENOL) 500 MG tablet Take 1,000 mg by mouth every 6 (six) hours as needed for mild pain. (Patient not taking: Reported on 11/09/2023)   HYDROcodone-acetaminophen (NORCO/VICODIN) 5-325 MG tablet Take 1 tablet by mouth every 6 (six) hours as needed for severe pain. (Patient not taking: Reported on 11/09/2023)   ibuprofen (ADVIL,MOTRIN) 600 MG tablet Take 1 tablet (600 mg total) by mouth every 6 (six) hours. (Patient not taking: Reported on 11/09/2023)   metroNIDAZOLE (FLAGYL) 500 MG tablet Take 1 tablet (500 mg total) by mouth 2 (two) times daily. (Patient not taking: Reported on 11/09/2023)   naproxen (NAPROSYN) 500 MG tablet Take 1 tablet (500 mg total) by mouth 2 (two) times daily. (Patient not taking: Reported on 11/09/2023)    ondansetron (ZOFRAN) 4 MG tablet Take 1 tablet (4 mg total) by mouth every 8 (eight) hours as needed for nausea or vomiting. (Patient not taking: Reported on 11/09/2023)   sulfamethoxazole-trimethoprim (BACTRIM DS) 800-160 MG tablet Take 1 tablet by mouth 2 (two) times daily. (Patient not taking: Reported on 11/09/2023)   tamsulosin (FLOMAX) 0.4 MG CAPS capsule Take 1 capsule (0.4 mg total) by mouth daily after breakfast. (Patient not taking: Reported on 11/09/2023)   No facility-administered encounter medications on file as of 11/09/2023.    Past Medical History:  Diagnosis Date   Anemia    Anxiety    Asthma    Bipolar 1 disorder (HCC)    Chlamydia    Depression    doing well   Gonorrhea    Headache(784.0)    Herpes    Last outbreak August 2014   Infection    UTI   Onset of menses    age of 70, regular, lasting 3 to 4 days, medium to heavy flow   Pre-eclampsia    Pregnancy induced hypertension    Preterm labor    Vaginal Pap smear, abnormal    colpo, ok since    Past Surgical History:  Procedure Laterality Date   CESAREAN SECTION N/A 03/02/2015   Procedure: CESAREAN SECTION;  Surgeon: Reva Bores, MD;  Location: [facility];  Service: Obstetrics;  Laterality: N/A;   COLPOSCOPY     VAGINAL DELIVERY     X3   WISDOM TOOTH EXTRACTION  Family History  Problem Relation Age of Onset   COPD Father    Prostate cancer Father    Glaucoma Father    Cataracts Father    Hypertension Father    Hypertension Maternal Grandmother    Diabetes Maternal Grandmother    Arthritis Other    Asthma Other    Glaucoma Other    Heart disease Other    Hypertension Other    Prostate cancer Other    Cataracts Other     Social History   Socioeconomic History   Marital status: Single    Spouse name: Not on file   Number of children: Not on file   Years of education: Not on file   Highest education level: Not on file  Occupational History   Occupation: Disabled  Tobacco Use    Smoking status: Former    Current packs/day: 0.00    Types: Cigarettes    Quit date: 12/20/2012    Years since quitting: 10.8   Smokeless tobacco: Former   Tobacco comments:    vapor-quit  Substance and Sexual Activity   Alcohol use: No   Drug use: No   Sexual activity: Yes    Birth control/protection: None    Comment: Feb 16, 2015 @ 1830  Other Topics Concern   Not on file  Social History Narrative   Not on file   Social Drivers of Health   Financial Resource Strain: Not on file  Food Insecurity: Not on file  Transportation Needs: Not on file  Physical Activity: Not on file  Stress: Not on file  Social Connections: Not on file  Intimate Partner Violence: Not on file    Review of Systems  Constitutional: Negative.   HENT: Negative.    Eyes: Negative.   Respiratory:  Negative for shortness of breath.   Cardiovascular:  Negative for chest pain.  Gastrointestinal:  Negative for diarrhea, nausea and vomiting.  Genitourinary:  Negative for dysuria, frequency and hematuria.  Musculoskeletal: Negative.   Skin: Negative.   Neurological: Negative.   Endo/Heme/Allergies: Negative.   Psychiatric/Behavioral: Negative.          Objective    BP 101/76 (BP Location: Left Arm, Patient Position: Sitting, Cuff Size: Normal)   Pulse 62   Ht 5\' 4"  (1.626 m)   Wt 136 lb (61.7 kg)   LMP 10/11/2023   SpO2 97%   BMI 23.34 kg/m   Physical Exam Vitals and nursing note reviewed. Exam conducted with a chaperone present.  Constitutional:      Appearance: Normal appearance.  HENT:     Head: Normocephalic and atraumatic.     Right Ear: External ear normal.     Left Ear: External ear normal.     Nose: Nose normal.     Mouth/Throat:     Mouth: Mucous membranes are moist.     Pharynx: Oropharynx is clear.  Eyes:     Extraocular Movements: Extraocular movements intact.     Conjunctiva/sclera: Conjunctivae normal.     Pupils: Pupils are equal, round, and reactive to light.   Cardiovascular:     Rate and Rhythm: Normal rate and regular rhythm.     Pulses: Normal pulses.     Heart sounds: Normal heart sounds.  Pulmonary:     Effort: Pulmonary effort is normal.     Breath sounds: Normal breath sounds.  Genitourinary:    Pubic Area: No rash.      Vagina: Normal. No vaginal discharge.  Cervix: Normal. No discharge or friability.     Uterus: Normal. Not tender.      Rectum: Normal. No external hemorrhoid.     Comments: Unable to palpate ovaries Musculoskeletal:        General: Normal range of motion.     Cervical back: Normal range of motion and neck supple.  Skin:    General: Skin is warm and dry.  Neurological:     General: No focal deficit present.     Mental Status: She is oriented to person, place, and time.  Psychiatric:        Mood and Affect: Mood normal.        Behavior: Behavior normal.        Thought Content: Thought content normal.        Judgment: Judgment normal.         Assessment & Plan:   Problem List Items Addressed This Visit       Other   Vitamin D deficiency   Relevant Orders   Vitamin D, 25-hydroxy (Completed)   Other Visit Diagnoses       Menorrhagia with regular cycle    -  Primary   Relevant Orders   TSH (Completed)   Ambulatory referral to Gynecology     Cervical cancer screening       Relevant Orders   CBC with Differential/Platelet (Completed)   Comp. Metabolic Panel (12) (Completed)   Cytology - PAP([place])     Anemia, unspecified type       Relevant Orders   CBC with Differential/Platelet (Completed)   Iron, TIBC and Ferritin Panel (Completed)     Screening examination for STI       Relevant Orders   HIV antibody (with reflex) (Completed)   RPR (Completed)   Cervicovaginal ancillary only (Completed)     1. Menorrhagia with regular cycle (Primary) Patient education given on supportive care - TSH - Ambulatory referral to Gynecology  2. Cervical cancer screening  - CBC with  Differential/Platelet - Comp. Metabolic Panel (12) - Cytology - PAP([place])  3. Vitamin D deficiency  - Vitamin D, 25-hydroxy  4. Anemia, unspecified type  - CBC with Differential/Platelet - Iron, TIBC and Ferritin Panel  5. Screening examination for STI  - HIV antibody (with reflex) - RPR - Cervicovaginal ancillary only    I have reviewed the patient's medical history (PMH, PSH, Social History, Family History, Medications, and allergies) , and have been updated if relevant. I spent 30 minutes reviewing chart and  face to face time with patient.    Return if symptoms worsen or fail to improve.   Kasandra Knudsen Mayers, PA-C

## 2023-11-10 LAB — CERVICOVAGINAL ANCILLARY ONLY
Bacterial Vaginitis (gardnerella): POSITIVE — AB
Candida Glabrata: NEGATIVE
Candida Vaginitis: NEGATIVE
Chlamydia: NEGATIVE
Comment: NEGATIVE
Comment: NEGATIVE
Comment: NEGATIVE
Comment: NEGATIVE
Comment: NEGATIVE
Comment: NORMAL
Neisseria Gonorrhea: NEGATIVE
Trichomonas: POSITIVE — AB

## 2023-11-10 LAB — CBC WITH DIFFERENTIAL/PLATELET
Basophils Absolute: 0 10*3/uL (ref 0.0–0.2)
Basos: 0 %
EOS (ABSOLUTE): 0 10*3/uL (ref 0.0–0.4)
Eos: 0 %
Hematocrit: 28.7 % — ABNORMAL LOW (ref 34.0–46.6)
Hemoglobin: 8.5 g/dL — ABNORMAL LOW (ref 11.1–15.9)
Immature Grans (Abs): 0 10*3/uL (ref 0.0–0.1)
Immature Granulocytes: 0 %
Lymphocytes Absolute: 1.9 10*3/uL (ref 0.7–3.1)
Lymphs: 31 %
MCH: 23.7 pg — ABNORMAL LOW (ref 26.6–33.0)
MCHC: 29.6 g/dL — ABNORMAL LOW (ref 31.5–35.7)
MCV: 80 fL (ref 79–97)
Monocytes Absolute: 0.5 10*3/uL (ref 0.1–0.9)
Monocytes: 7 %
Neutrophils Absolute: 3.8 10*3/uL (ref 1.4–7.0)
Neutrophils: 62 %
Platelets: 187 10*3/uL (ref 150–450)
RBC: 3.58 x10E6/uL — ABNORMAL LOW (ref 3.77–5.28)
RDW: 17.5 % — ABNORMAL HIGH (ref 11.7–15.4)
WBC: 6.2 10*3/uL (ref 3.4–10.8)

## 2023-11-10 LAB — COMP. METABOLIC PANEL (12)
AST: 18 [IU]/L (ref 0–40)
Albumin: 4.1 g/dL (ref 3.9–4.9)
Alkaline Phosphatase: 47 [IU]/L (ref 44–121)
BUN/Creatinine Ratio: 11 (ref 9–23)
BUN: 10 mg/dL (ref 6–20)
Bilirubin Total: 0.3 mg/dL (ref 0.0–1.2)
Calcium: 8.6 mg/dL — ABNORMAL LOW (ref 8.7–10.2)
Chloride: 107 mmol/L — ABNORMAL HIGH (ref 96–106)
Creatinine, Ser: 0.93 mg/dL (ref 0.57–1.00)
Globulin, Total: 2.4 g/dL (ref 1.5–4.5)
Glucose: 87 mg/dL (ref 70–99)
Potassium: 3.6 mmol/L (ref 3.5–5.2)
Sodium: 142 mmol/L (ref 134–144)
Total Protein: 6.5 g/dL (ref 6.0–8.5)
eGFR: 82 mL/min/{1.73_m2} (ref >59)

## 2023-11-10 LAB — IRON,TIBC AND FERRITIN PANEL
Ferritin: 4 ng/mL — ABNORMAL LOW (ref 15–150)
Iron Saturation: 5 % — CL (ref 15–55)
Iron: 21 ug/dL — ABNORMAL LOW (ref 27–159)
Total Iron Binding Capacity: 395 ug/dL (ref 250–450)
UIBC: 374 ug/dL (ref 131–425)

## 2023-11-10 LAB — VITAMIN D 25 HYDROXY (VIT D DEFICIENCY, FRACTURES): Vit D, 25-Hydroxy: 6.3 ng/mL — ABNORMAL LOW (ref 30.0–100.0)

## 2023-11-10 LAB — TSH: TSH: 0.8 u[IU]/mL (ref 0.450–4.500)

## 2023-11-10 LAB — RPR: RPR Ser Ql: NONREACTIVE

## 2023-11-10 LAB — HIV ANTIBODY (ROUTINE TESTING W REFLEX): HIV Screen 4th Generation wRfx: NONREACTIVE

## 2023-11-11 ENCOUNTER — Encounter: Payer: Self-pay | Admitting: Physician Assistant

## 2023-11-11 DIAGNOSIS — D509 Iron deficiency anemia, unspecified: Secondary | ICD-10-CM | POA: Insufficient documentation

## 2023-11-11 LAB — CYTOLOGY - PAP: Diagnosis: NEGATIVE

## 2023-11-11 MED ORDER — METRONIDAZOLE 500 MG PO TABS: 500.0000 mg | ORAL_TABLET | Freq: Two times a day (BID) | ORAL | 0 refills | Status: AC

## 2023-11-11 MED ORDER — IRON (FERROUS SULFATE) 325 (65 FE) MG PO TABS: 325.0000 mg | ORAL_TABLET | Freq: Every day | ORAL | 1 refills | Status: AC

## 2023-11-11 NOTE — Addendum Note (Signed)
Addended by: Roney Jaffe on: 11/11/2023 11:52 AM   Modules accepted: Orders

## 2024-02-06 ENCOUNTER — Encounter: Payer: 59 | Admitting: Obstetrics and Gynecology

## 2024-02-06 NOTE — Progress Notes (Deleted)
   NEW GYNECOLOGY VISIT  Subjective:  Tammy Cole is a 38 y.o. [id number] with LMP *** & hx BTL (clips) presenting for painful periods   I reviewed the following: - PCP note Cari Mayers PA-C 11/09/23 - Pap 11/09/23 NILM - Cervicovaginal ancillary 11/09/23 - +trich & BV - CT A/P 04/10/22 anteverted uterus, possible posterior fibroid, filschie clips on L side c/f migration  Objective:  There were no vitals filed for this visit.  General:  Alert, oriented and cooperative. Patient is in no acute distress.  Skin: Skin is warm and dry. No rash noted.   Cardiovascular: Normal heart rate noted  Respiratory: Normal respiratory effort, no problems with respiration noted  Abdomen: Soft, non-tender, non-distended   Pelvic: NEFG.   Exam performed in the presence of a chaperone  Assessment and Plan:  Tammy Cole is a 38 y.o. with ***  There are no diagnoses linked to this encounter.  No follow-ups on file.  Future Appointments  Date Time Provider Department Center  02/06/2024 10:15 AM Lennart Pall, MD [facility] None    Lennart Pall, MD
